# Patient Record
Sex: Female | Born: 1935 | Race: Black or African American | Hispanic: No | Smoking: Never smoker
Health system: Southern US, Community
[De-identification: ages and names within clinical notes are randomized; demographics above are authoritative.]

## PROBLEM LIST (undated history)

## (undated) DIAGNOSIS — I441 Atrioventricular block, second degree: Secondary | ICD-10-CM

## (undated) DIAGNOSIS — N189 Chronic kidney disease, unspecified: Secondary | ICD-10-CM

## (undated) DIAGNOSIS — E785 Hyperlipidemia, unspecified: Secondary | ICD-10-CM

## (undated) DIAGNOSIS — E119 Type 2 diabetes mellitus without complications: Secondary | ICD-10-CM

## (undated) DIAGNOSIS — Z950 Presence of cardiac pacemaker: Secondary | ICD-10-CM

## (undated) DIAGNOSIS — H409 Unspecified glaucoma: Secondary | ICD-10-CM

## (undated) DIAGNOSIS — I10 Essential (primary) hypertension: Secondary | ICD-10-CM

## (undated) DIAGNOSIS — M1990 Unspecified osteoarthritis, unspecified site: Secondary | ICD-10-CM

## (undated) HISTORY — PX: ABDOMINAL HYSTERECTOMY: SHX81

## (undated) HISTORY — PX: CHOLECYSTECTOMY: SHX55

## (undated) HISTORY — PX: TONSILLECTOMY: SUR1361

## (undated) HISTORY — DX: Atrioventricular block, second degree: I44.1

## (undated) HISTORY — PX: OOPHORECTOMY: SHX86

## (undated) HISTORY — DX: Essential (primary) hypertension: I10

## (undated) HISTORY — DX: Presence of cardiac pacemaker: Z95.0

## (undated) HISTORY — PX: APPENDECTOMY: SHX54

## (undated) HISTORY — DX: Hyperlipidemia, unspecified: E78.5

## (undated) HISTORY — DX: Type 2 diabetes mellitus without complications: E11.9

---

## 2003-11-02 ENCOUNTER — Other Ambulatory Visit: Payer: Self-pay

## 2004-01-01 ENCOUNTER — Other Ambulatory Visit: Payer: Self-pay

## 2004-09-14 ENCOUNTER — Ambulatory Visit: Payer: Self-pay

## 2005-10-28 ENCOUNTER — Ambulatory Visit: Payer: Self-pay

## 2006-11-03 ENCOUNTER — Ambulatory Visit: Payer: Self-pay | Admitting: Internal Medicine

## 2007-09-05 ENCOUNTER — Inpatient Hospital Stay: Payer: Self-pay | Admitting: Internal Medicine

## 2007-09-05 ENCOUNTER — Other Ambulatory Visit: Payer: Self-pay

## 2007-10-26 ENCOUNTER — Ambulatory Visit: Payer: Self-pay | Admitting: Internal Medicine

## 2008-04-23 ENCOUNTER — Ambulatory Visit: Payer: Self-pay | Admitting: Internal Medicine

## 2008-05-17 ENCOUNTER — Ambulatory Visit: Payer: Self-pay | Admitting: Internal Medicine

## 2008-06-17 ENCOUNTER — Ambulatory Visit: Payer: Self-pay | Admitting: Internal Medicine

## 2008-07-15 ENCOUNTER — Ambulatory Visit: Payer: Self-pay | Admitting: Internal Medicine

## 2008-10-30 ENCOUNTER — Ambulatory Visit: Payer: Self-pay | Admitting: Internal Medicine

## 2009-11-10 ENCOUNTER — Ambulatory Visit: Payer: Self-pay | Admitting: Internal Medicine

## 2010-12-29 ENCOUNTER — Ambulatory Visit: Payer: Self-pay | Admitting: Internal Medicine

## 2010-12-30 ENCOUNTER — Ambulatory Visit: Payer: Self-pay | Admitting: Internal Medicine

## 2011-05-28 ENCOUNTER — Ambulatory Visit: Payer: Self-pay | Admitting: Internal Medicine

## 2011-06-18 ENCOUNTER — Ambulatory Visit: Payer: Self-pay | Admitting: Internal Medicine

## 2011-07-16 ENCOUNTER — Ambulatory Visit: Payer: Self-pay | Admitting: Internal Medicine

## 2011-12-31 ENCOUNTER — Ambulatory Visit: Payer: Self-pay | Admitting: Internal Medicine

## 2012-08-25 ENCOUNTER — Encounter: Payer: Self-pay | Admitting: *Deleted

## 2013-01-02 ENCOUNTER — Ambulatory Visit: Payer: Self-pay | Admitting: Internal Medicine

## 2013-10-31 DIAGNOSIS — N184 Chronic kidney disease, stage 4 (severe): Secondary | ICD-10-CM | POA: Insufficient documentation

## 2013-10-31 DIAGNOSIS — N183 Chronic kidney disease, stage 3 unspecified: Secondary | ICD-10-CM | POA: Insufficient documentation

## 2013-10-31 DIAGNOSIS — E1169 Type 2 diabetes mellitus with other specified complication: Secondary | ICD-10-CM | POA: Insufficient documentation

## 2013-10-31 DIAGNOSIS — E782 Mixed hyperlipidemia: Secondary | ICD-10-CM | POA: Insufficient documentation

## 2013-10-31 DIAGNOSIS — E785 Hyperlipidemia, unspecified: Secondary | ICD-10-CM | POA: Insufficient documentation

## 2013-10-31 DIAGNOSIS — E1122 Type 2 diabetes mellitus with diabetic chronic kidney disease: Secondary | ICD-10-CM | POA: Insufficient documentation

## 2014-01-03 ENCOUNTER — Ambulatory Visit: Payer: Self-pay | Admitting: Internal Medicine

## 2014-01-06 ENCOUNTER — Emergency Department: Payer: Self-pay | Admitting: Internal Medicine

## 2014-01-11 ENCOUNTER — Ambulatory Visit: Payer: Self-pay | Admitting: Unknown Physician Specialty

## 2014-01-30 DIAGNOSIS — M1712 Unilateral primary osteoarthritis, left knee: Secondary | ICD-10-CM | POA: Insufficient documentation

## 2014-05-03 DIAGNOSIS — R251 Tremor, unspecified: Secondary | ICD-10-CM | POA: Insufficient documentation

## 2014-05-03 DIAGNOSIS — E559 Vitamin D deficiency, unspecified: Secondary | ICD-10-CM | POA: Insufficient documentation

## 2014-06-15 ENCOUNTER — Emergency Department: Payer: Self-pay | Admitting: Physician Assistant

## 2014-06-25 ENCOUNTER — Encounter: Payer: Self-pay | Admitting: Podiatry

## 2014-06-25 ENCOUNTER — Ambulatory Visit (INDEPENDENT_AMBULATORY_CARE_PROVIDER_SITE_OTHER): Payer: Medicare Other | Admitting: Podiatry

## 2014-06-25 DIAGNOSIS — M79676 Pain in unspecified toe(s): Secondary | ICD-10-CM

## 2014-06-25 DIAGNOSIS — B351 Tinea unguium: Secondary | ICD-10-CM

## 2014-06-25 NOTE — Progress Notes (Signed)
   Subjective:    Patient ID: Haley MaplesAlice R Baquera, female    DOB: 07/17/1929, 81 y.o.   MRN: [id number]  HPI 81 year old female presents the office today with complaints of painful, elongated, thick toenails. She denies any recent redness or drainage from the nail sites however she does state the nails become painful with pressure. She denies any history of ulceration or any intermittent claudication symptoms. She states that she's been diabetic since 81 years old. No other complaints at this time.   Review of Systems  Allergic/Immunologic: Positive for food allergies.  Neurological: Positive for tremors.  All other systems reviewed and are negative.      Objective:   Physical Exam AAO 3, NAD DP pulses 2/4 bilaterally, PT pulse 1/4 bilaterally, CRT less than 3 seconds Protective sensation intact with Simms Weinstein monofilament, vibratory sensation intact, Achilles tendon reflex intact. Nails are hypertrophic, dystrophic, elongated, brittle, discolored 10. There is objective tenderness along the toenails 10. No surrounding erythema or drainage. No open lesions or pre-ulcerative lesions are identified. No other areas of tenderness bilateral lower extremity is no overlying edema, erythema, increased warmth. MMT 5/5, ROM WNL No pain with calf compression, swelling, warmth, erythema.       Assessment & Plan:  81 year old female with symptomatic onychomycosis -Treatment options discussed including alternatives, risks, complications. -Nail sharply debrided 10 without complication/bleeding. -Discussed importance of daily foot inspection. -Follow-up in 3 months or sooner if any problems are to arise. Encouraged to call the office with any questions, concerns, change in symptoms.

## 2014-06-25 NOTE — Patient Instructions (Signed)
Diabetes and Foot Care Diabetes may cause you to have problems because of poor blood supply (circulation) to your feet and legs. This may cause the skin on your feet to become thinner, break easier, and heal more slowly. Your skin may become dry, and the skin may peel and crack. You may also have nerve damage in your legs and feet causing decreased feeling in them. You may not notice minor injuries to your feet that could lead to infections or more serious problems. Taking care of your feet is one of the most important things you can do for yourself.  HOME CARE INSTRUCTIONS  Wear shoes at all times, even in the house. Do not go barefoot. Bare feet are easily injured.  Check your feet daily for blisters, cuts, and redness. If you cannot see the bottom of your feet, use a mirror or ask someone for help.  Wash your feet with warm water (do not use hot water) and mild soap. Then pat your feet and the areas between your toes until they are completely dry. Do not soak your feet as this can dry your skin.  Apply a moisturizing lotion or petroleum jelly (that does not contain alcohol and is unscented) to the skin on your feet and to dry, brittle toenails. Do not apply lotion between your toes.  Trim your toenails straight across. Do not dig under them or around the cuticle. File the edges of your nails with an emery board or nail file.  Do not cut corns or calluses or try to remove them with medicine.  Wear clean socks or stockings every day. Make sure they are not too tight. Do not wear knee-high stockings since they may decrease blood flow to your legs.  Wear shoes that fit properly and have enough cushioning. To break in new shoes, wear them for just a few hours a day. This prevents you from injuring your feet. Always look in your shoes before you put them on to be sure there are no objects inside.  Do not cross your legs. This may decrease the blood flow to your feet.  If you find a minor scrape,  cut, or break in the skin on your feet, keep it and the skin around it clean and dry. These areas may be cleansed with mild soap and water. Do not cleanse the area with peroxide, alcohol, or iodine.  When you remove an adhesive bandage, be sure not to damage the skin around it.  If you have a wound, look at it several times a day to make sure it is healing.  Do not use heating pads or hot water bottles. They may burn your skin. If you have lost feeling in your feet or legs, you may not know it is happening until it is too late.  Make sure your health care provider performs a complete foot exam at least annually or more often if you have foot problems. Report any cuts, sores, or bruises to your health care provider immediately. SEEK MEDICAL CARE IF:   You have an injury that is not healing.  You have cuts or breaks in the skin.  You have an ingrown nail.  You notice redness on your legs or feet.  You feel burning or tingling in your legs or feet.  You have pain or cramps in your legs and feet.  Your legs or feet are numb.  Your feet always feel cold. SEEK IMMEDIATE MEDICAL CARE IF:   There is increasing redness,   swelling, or pain in or around a wound.  There is a red line that goes up your leg.  Pus is coming from a wound.  You develop a fever or as directed by your health care provider.  You notice a bad smell coming from an ulcer or wound. Document Released: 04/30/2000 Document Revised: 01/03/2013 Document Reviewed: 10/10/2012 Exit[employer] Patient Information 2015 [employer]. This information is not intended to replace advice given to you by your health care provider. Make sure you discuss any questions you have with your health care provider.  

## 2014-07-05 ENCOUNTER — Ambulatory Visit: Payer: Self-pay | Admitting: Orthopedic Surgery

## 2014-07-05 MED ORDER — DEXAMETHASONE SODIUM PHOSPHATE 4 MG/ML IJ SOLN: 4.0000 mg | Freq: Once | INTRAMUSCULAR | Status: DC

## 2014-07-05 MED ORDER — ACETAMINOPHEN 10 MG/ML IV SOLN: 1000.0000 mg | Freq: Once | INTRAVENOUS | Status: AC

## 2014-07-05 NOTE — H&P (Signed)
TOTAL KNEE ADMISSION H&P  Patient is being admitted for left medial UKA vs total knee arthroplasty.  Subjective:  Chief Complaint:left knee pain.  HPI: Haley Mayer, 81 y.o. female, has a history of pain and functional disability in the left knee due to AVN with subchondral fracture and has failed non-surgical conservative treatments for greater than 12 weeks to includeNSAID's and/or analgesics, use of assistive devices and activity modification.  Onset of symptoms was abrupt, starting 1 years ago with gradually worsening course since that time. The patient noted no past surgery on the left knee(s).  Patient currently rates pain in the left knee(s) at 10 out of 10 with activity. Patient has night pain, worsening of pain with activity and weight bearing and pain that interferes with activities of daily living.  Patient has evidence of joint space narrowing and displaced subchondral stress fracture by imaging studies.There is no active infection.  There are no active problems to display for this patient.  Past Medical History  Diagnosis Date  . Hypertension   . Diabetes mellitus without complication   . Hyperlipidemia     No past surgical history on file.   (Not in a hospital admission) Allergies  Allergen Reactions  . Loperamide Hives  . Primidone   . Vasotec [Enalapril]     History  Substance Use Topics  . Smoking status: Never Smoker   . Smokeless tobacco: Never Used  . Alcohol Use: No    No family history on file.   Review of Systems  Constitutional: Negative.   HENT: Negative.   Eyes: Negative.   Respiratory: Negative.   Cardiovascular: Negative.   Gastrointestinal: Negative.   Genitourinary: Negative.   Musculoskeletal: Positive for joint pain.       Left knee pain  Skin: Negative.   Neurological: Negative.   Endo/Heme/Allergies: Positive for environmental allergies.  Psychiatric/Behavioral: Negative.     Objective:  Physical Exam  Vitals  reviewed. Constitutional: She is oriented to person, place, and time. She appears well-developed and well-nourished.  HENT:  Head: Normocephalic and atraumatic.  Eyes: EOM are normal. Pupils are equal, round, and reactive to light.  Neck: Normal range of motion. Neck supple.  Cardiovascular: Normal rate, regular rhythm, normal heart sounds and intact distal pulses.   Respiratory: Effort normal and breath sounds normal.  GI: Soft. Bowel sounds are normal.  Genitourinary:  deferred  Musculoskeletal:       Left knee: She exhibits normal range of motion. Tenderness found. Medial joint line tenderness noted.  Neurological: She is alert and oriented to person, place, and time.  Skin: Skin is warm and dry.  Psychiatric: She has a normal mood and affect. Her behavior is normal.    Vital signs in last 24 hours: @VSRANGES @  Labs:   There is no height or weight on file to calculate BMI.   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 The overall alignment ismild varus. The bone quality appears to be adequate for age and reported activity level.  Assessment/Plan:  AVN with displaced subchondral stress fracture, left knee   The patient history, physical examination, clinical judgment of the provider and imaging studies are consistent with AVN with displaced subchondral stress fracture of the left knee(s) and medial UKA vs total knee arthroplasty is deemed medically necessary. The treatment options including medical management, injection therapy arthroscopy and arthroplasty were discussed at length. The risks and benefits of total knee arthroplasty were presented and reviewed. The risks  due to aseptic loosening, infection, stiffness, patella tracking problems, thromboembolic complications and other imponderables were discussed. The patient acknowledged the explanation, agreed to proceed with the plan and consent was signed. Patient is being  admitted for inpatient treatment for surgery, pain control, PT, OT, prophylactic antibiotics, VTE prophylaxis, progressive ambulation and ADL's and discharge planning. The patient is planning to be discharged home with home health services

## 2014-07-08 NOTE — Patient Instructions (Addendum)
Haley Mayer  07/08/2014   Your procedure is scheduled on: 07/19/2014    [facility]  [facility] and follow signs to               Short Stay Center at      0530 AM.  Call this number if you have problems the morning of surgery [phone]   Remember:  Do not eat food or drink liquids :After Midnight.     Take these medicines the morning of surgery with A SIP OF WATER: Amlodipine ( Norvasc)                                You may not have any metal on your body including hair pins and              piercings  Do not wear jewelry, make-up, lotions, powders or perfumes., deodorant.              Do not wear nail polish.  Do not shave  48 hours prior to surgery.                Do not bring valuables to the hospital. [place] IS NOT             RESPONSIBLE   FOR VALUABLES.  Contacts, dentures or bridgework may not be worn into surgery.  Leave suitcase in the car. After surgery it may be brought to your room.         Special Instructions: coughing and deep breathing exercises, leg exercises               Please read over the following fact sheets you were given: _____________________________________________________________________             [facility] - Preparing for Surgery Before surgery, you can play an important role.  Because skin is not sterile, your skin needs to be as free of germs as possible.  You can reduce the number of germs on your skin by washing with CHG (chlorahexidine gluconate) soap before surgery.  CHG is an antiseptic cleaner which kills germs and bonds with the skin to continue killing germs even after washing. Please DO NOT use if you have an allergy to CHG or antibacterial soaps.  If your skin becomes reddened/irritated stop using the CHG and inform your nurse when you arrive at Short Stay. Do not shave (including legs and underarms) for at least 48 hours prior to the first CHG shower.  You may shave your  face/neck. Please follow these instructions carefully:  1.  Shower with CHG Soap the night before surgery and the  morning of Surgery.  2.  If you choose to wash your hair, wash your hair first as usual with your  normal  shampoo.  3.  After you shampoo, rinse your hair and body thoroughly to remove the  shampoo.                           4.  Use CHG as you would any other liquid soap.  You can apply chg directly  to the skin and wash                       Gently with a scrungie or clean washcloth.  5.  Apply the CHG Soap to your body ONLY FROM THE NECK DOWN.   Do not use on face/ open                           Wound or open sores. Avoid contact with eyes, ears mouth and genitals (private parts).                       Wash face,  Genitals (private parts) with your normal soap.             6.  Wash thoroughly, paying special attention to the area where your surgery  will be performed.  7.  Thoroughly rinse your body with warm water from the neck down.  8.  DO NOT shower/wash with your normal soap after using and rinsing off  the CHG Soap.                9.  Pat yourself dry with a clean towel.            10.  Wear clean pajamas.            11.  Place clean sheets on your bed the night of your first shower and do not  sleep with pets. Day of Surgery : Do not apply any lotions/deodorants the morning of surgery.  Please wear clean clothes to the hospital/surgery center.  FAILURE TO FOLLOW THESE INSTRUCTIONS MAY RESULT IN THE CANCELLATION OF YOUR SURGERY PATIENT SIGNATURE_________________________________  NURSE SIGNATURE__________________________________  ________________________________________________________________________  WHAT IS A BLOOD TRANSFUSION? Blood Transfusion Information  A transfusion is the replacement of blood or some of its parts. Blood is made up of multiple cells which provide different functions.  Red blood cells carry oxygen and are used for blood loss  replacement.  White blood cells fight against infection.  Platelets control bleeding.  Plasma helps clot blood.  Other blood products are available for specialized needs, such as hemophilia or other clotting disorders. BEFORE THE TRANSFUSION  Who gives blood for transfusions?   Healthy volunteers who are fully evaluated to make sure their blood is safe. This is blood bank blood. Transfusion therapy is the safest it has ever been in the practice of medicine. Before blood is taken from a donor, a complete history is taken to make sure that person has no history of diseases nor engages in risky social behavior (examples are intravenous drug use or sexual activity with multiple partners). The donor's travel history is screened to minimize risk of transmitting infections, such as malaria. The donated blood is tested for signs of infectious diseases, such as HIV and hepatitis. The blood is then tested to be sure it is compatible with you in order to minimize the chance of a transfusion reaction. If you or a relative donates blood, this is often done in anticipation of surgery and is not appropriate for emergency situations. It takes many days to process the donated blood. RISKS AND COMPLICATIONS Although transfusion therapy is very safe and saves many lives, the main dangers of transfusion include:  1. Getting an infectious disease. 2. Developing a transfusion reaction. This is an allergic reaction to something in the blood you were given. Every precaution is taken to prevent this. The decision to have a blood transfusion has been considered carefully by your caregiver before blood is given. Blood is not given unless the benefits outweigh the risks. AFTER THE TRANSFUSION  Right after  receiving a blood transfusion, you will usually feel much better and more energetic. This is especially true if your red blood cells have gotten low (anemic). The transfusion raises the level of the red blood cells which  carry oxygen, and this usually causes an energy increase.  The nurse administering the transfusion will monitor you carefully for complications. HOME CARE INSTRUCTIONS  No special instructions are needed after a transfusion. You may find your energy is better. Speak with your caregiver about any limitations on activity for underlying diseases you may have. SEEK MEDICAL CARE IF:   Your condition is not improving after your transfusion.  You develop redness or irritation at the intravenous (IV) site. SEEK IMMEDIATE MEDICAL CARE IF:  Any of the following symptoms occur over the next 12 hours:  Shaking chills.  You have a temperature by mouth above 102 F (38.9 C), not controlled by medicine.  Chest, back, or muscle pain.  People around you feel you are not acting correctly or are confused.  Shortness of breath or difficulty breathing.  Dizziness and fainting.  You get a rash or develop hives.  You have a decrease in urine output.  Your urine turns a dark color or changes to pink, red, or brown. Any of the following symptoms occur over the next 10 days:  You have a temperature by mouth above 102 F (38.9 C), not controlled by medicine.  Shortness of breath.  Weakness after normal activity.  The white part of the eye turns yellow (jaundice).  You have a decrease in the amount of urine or are urinating less often.  Your urine turns a dark color or changes to pink, red, or brown. Document Released: 04/30/2000 Document Revised: 07/26/2011 Document Reviewed: 12/18/2007 Exit[employer] Patient Information 2014 [place].  _______________________________________________________________________  Incentive Spirometer  An incentive spirometer is a tool that can help keep your lungs clear and active. This tool measures how well you are filling your lungs with each breath. Taking long deep breaths may help reverse or decrease the chance of developing breathing (pulmonary) problems  (especially infection) following:  A long period of time when you are unable to move or be active. BEFORE THE PROCEDURE   If the spirometer includes an indicator to show your best effort, your nurse or respiratory therapist will set it to a desired goal.  If possible, sit up straight or lean slightly forward. Try not to slouch.  Hold the incentive spirometer in an upright position. INSTRUCTIONS FOR USE  3. Sit on the edge of your bed if possible, or sit up as far as you can in bed or on a chair. 4. Hold the incentive spirometer in an upright position. 5. Breathe out normally. 6. Place the mouthpiece in your mouth and seal your lips tightly around it. 7. Breathe in slowly and as deeply as possible, raising the piston or the ball toward the top of the column. 8. Hold your breath for 3-5 seconds or for as long as possible. Allow the piston or ball to fall to the bottom of the column. 9. Remove the mouthpiece from your mouth and breathe out normally. 10. Rest for a few seconds and repeat Steps 1 through 7 at least 10 times every 1-2 hours when you are awake. Take your time and take a few normal breaths between deep breaths. 11. The spirometer may include an indicator to show your best effort. Use the indicator as a goal to work toward during each repetition. 12. After each set  of 10 deep breaths, practice coughing to be sure your lungs are clear. If you have an incision (the cut made at the time of surgery), support your incision when coughing by placing a pillow or rolled up towels firmly against it. Once you are able to get out of bed, walk around indoors and cough well. You may stop using the incentive spirometer when instructed by your caregiver.  RISKS AND COMPLICATIONS  Take your time so you do not get dizzy or light-headed.  If you are in pain, you may need to take or ask for pain medication before doing incentive spirometry. It is harder to take a deep breath if you are having  pain. AFTER USE  Rest and breathe slowly and easily.  It can be helpful to keep track of a log of your progress. Your caregiver can provide you with a simple table to help with this. If you are using the spirometer at home, follow these instructions: [place] IF:   You are having difficultly using the spirometer.  You have trouble using the spirometer as often as instructed.  Your pain medication is not giving enough relief while using the spirometer.  You develop fever of 100.5 F (38.1 C) or higher. SEEK IMMEDIATE MEDICAL CARE IF:   You cough up bloody sputum that had not been present before.  You develop fever of 102 F (38.9 C) or greater.  You develop worsening pain at or near the incision site. MAKE SURE YOU:   Understand these instructions.  Will watch your condition.  Will get help right away if you are not doing well or get worse. Document Released: 09/13/2006 Document Revised: 07/26/2011 Document Reviewed: 11/14/2006 [facility] Patient Information 2014 [place], [place].   ________________________________________________________________________

## 2014-07-09 ENCOUNTER — Encounter (HOSPITAL_COMMUNITY): Payer: Self-pay

## 2014-07-09 ENCOUNTER — Encounter (HOSPITAL_COMMUNITY)
Admission: RE | Admit: 2014-07-09 | Discharge: 2014-07-09 | Disposition: A | Discharge status: Home / Self Care | Payer: Medicare Other | Source: Ambulatory Visit | Attending: Orthopedic Surgery | Admitting: Orthopedic Surgery

## 2014-07-09 DIAGNOSIS — Z539 Procedure and treatment not carried out, unspecified reason: Secondary | ICD-10-CM | POA: Insufficient documentation

## 2014-07-09 DIAGNOSIS — Z01810 Encounter for preprocedural cardiovascular examination: Secondary | ICD-10-CM | POA: Diagnosis not present

## 2014-07-09 DIAGNOSIS — Z01812 Encounter for preprocedural laboratory examination: Secondary | ICD-10-CM | POA: Insufficient documentation

## 2014-07-09 HISTORY — DX: Chronic kidney disease, unspecified: N18.9

## 2014-07-09 HISTORY — DX: Unspecified osteoarthritis, unspecified site: M19.90

## 2014-07-09 LAB — URINALYSIS, ROUTINE W REFLEX MICROSCOPIC
Bilirubin Urine: NEGATIVE
Glucose, UA: NEGATIVE mg/dL
HGB URINE DIPSTICK: NEGATIVE
Ketones, ur: NEGATIVE mg/dL
Leukocytes, UA: NEGATIVE
Nitrite: NEGATIVE
Protein, ur: NEGATIVE mg/dL
SPECIFIC GRAVITY, URINE: 1.024 (ref 1.005–1.030)
UROBILINOGEN UA: 0.2 mg/dL (ref 0.0–1.0)
pH: 5.5 (ref 5.0–8.0)

## 2014-07-09 LAB — SURGICAL PCR SCREEN
MRSA, PCR: NEGATIVE
Staphylococcus aureus: NEGATIVE

## 2014-07-09 LAB — COMPREHENSIVE METABOLIC PANEL
ALT: 14 U/L (ref 0–35)
ANION GAP: 9 (ref 5–15)
AST: 19 U/L (ref 0–37)
Albumin: 4 g/dL (ref 3.5–5.2)
Alkaline Phosphatase: 69 U/L (ref 39–117)
BUN: 31 mg/dL — AB (ref 6–23)
CO2: 30 mmol/L (ref 19–32)
Calcium: 9.4 mg/dL (ref 8.4–10.5)
Chloride: 102 mmol/L (ref 96–112)
Creatinine, Ser: 1.2 mg/dL — ABNORMAL HIGH (ref 0.50–1.10)
GFR calc non Af Amer: 41 mL/min — ABNORMAL LOW (ref >90)
GFR, EST AFRICAN AMERICAN: 47 mL/min — AB (ref >90)
GLUCOSE: 177 mg/dL — AB (ref 70–99)
Potassium: 4.1 mmol/L (ref 3.5–5.1)
SODIUM: 141 mmol/L (ref 135–145)
TOTAL PROTEIN: 7.8 g/dL (ref 6.0–8.3)
Total Bilirubin: 0.6 mg/dL (ref 0.3–1.2)

## 2014-07-09 LAB — CBC
HCT: 40.5 % (ref 36.0–46.0)
HEMOGLOBIN: 13.4 g/dL (ref 12.0–15.0)
MCH: 31.5 pg (ref 26.0–34.0)
MCHC: 33.1 g/dL (ref 30.0–36.0)
MCV: 95.1 fL (ref 78.0–100.0)
PLATELETS: 162 10*3/uL (ref 150–400)
RBC: 4.26 MIL/uL (ref 3.87–5.11)
RDW: 12.9 % (ref 11.5–15.5)
WBC: 6.8 10*3/uL (ref 4.0–10.5)

## 2014-07-09 LAB — PROTIME-INR
INR: 0.97 (ref 0.00–1.49)
Prothrombin Time: 13 seconds (ref 11.6–15.2)

## 2014-07-09 LAB — APTT: aPTT: 26 seconds (ref 24–37)

## 2014-07-09 NOTE — Progress Notes (Signed)
Patient to see cardiologist on 07/15/2014 ( [place]. ).  Patient states " I have a blockage per Dr Harrington Challengerhies. " Also patient reports at time of preop appointment that she has seen dentist and has a tooth that needs a filling.  Instructed patient to call and notify Dr Linna CapriceSwinteck prior to surgery.  Patient and daughter voiced understanding.

## 2014-07-19 ENCOUNTER — Encounter (HOSPITAL_COMMUNITY): Admission: RE | Payer: Self-pay | Source: Ambulatory Visit

## 2014-07-19 ENCOUNTER — Inpatient Hospital Stay (HOSPITAL_COMMUNITY)
Admission: RE | Admit: 2014-07-19 | Payer: BLUE CROSS/BLUE SHIELD | Source: Ambulatory Visit | Admitting: Orthopedic Surgery

## 2014-07-19 SURGERY — ARTHROPLASTY, KNEE, TOTAL
Anesthesia: Choice | Laterality: Left

## 2014-07-25 DIAGNOSIS — I447 Left bundle-branch block, unspecified: Secondary | ICD-10-CM | POA: Insufficient documentation

## 2014-07-26 ENCOUNTER — Ambulatory Visit: Payer: Self-pay | Admitting: Orthopedic Surgery

## 2014-07-26 MED ORDER — DEXAMETHASONE SODIUM PHOSPHATE 4 MG/ML IJ SOLN
4.0000 mg | Freq: Once | INTRAMUSCULAR | Status: AC
  Administered 2014-08-07: 4 mg via INTRAVENOUS

## 2014-07-26 MED ORDER — ACETAMINOPHEN 10 MG/ML IV SOLN: 1000.0000 mg | Freq: Once | INTRAVENOUS | Status: AC

## 2014-07-26 MED ORDER — SODIUM CHLORIDE 0.9 % IV SOLN: 4.0000 mg | Freq: Once | INTRAVENOUS | Status: DC

## 2014-08-05 ENCOUNTER — Encounter (HOSPITAL_COMMUNITY): Payer: Self-pay | Admitting: Pharmacy Technician

## 2014-08-06 ENCOUNTER — Encounter (HOSPITAL_COMMUNITY): Payer: Self-pay | Admitting: *Deleted

## 2014-08-06 MED ORDER — CHLORHEXIDINE GLUCONATE 4 % EX LIQD
60.0000 mL | Freq: Once | CUTANEOUS | Status: DC
  Filled 2014-08-06: qty 60

## 2014-08-06 MED ORDER — CHLORHEXIDINE GLUCONATE 4 % EX LIQD: 60.0000 mL | Freq: Once | CUTANEOUS | Status: DC

## 2014-08-06 MED ORDER — SODIUM CHLORIDE 0.9 % IV SOLN: INTRAVENOUS | Status: DC

## 2014-08-06 MED ORDER — TRANEXAMIC ACID 100 MG/ML IV SOLN
1000.0000 mg | INTRAVENOUS | Status: DC
  Filled 2014-08-06: qty 10

## 2014-08-06 MED ORDER — CEFAZOLIN SODIUM-DEXTROSE 2-3 GM-% IV SOLR
2.0000 g | INTRAVENOUS | Status: AC
  Administered 2014-08-07: 2 g via INTRAVENOUS
  Filled 2014-08-06: qty 50

## 2014-08-06 MED ORDER — TRANEXAMIC ACID 100 MG/ML IV SOLN
1000.0000 mg | INTRAVENOUS | Status: AC
  Administered 2014-08-07: 1000 mg via INTRAVENOUS
  Filled 2014-08-06: qty 10

## 2014-08-06 MED ORDER — CEFAZOLIN SODIUM-DEXTROSE 2-3 GM-% IV SOLR: 2.0000 g | INTRAVENOUS | Status: DC

## 2014-08-06 NOTE — Progress Notes (Signed)
Anesthesia Chart Review:  SAME DAY WORK-UP.  Patient is a 81 year old female scheduled for left mediali partial replacement verus TKA on 08/07/14 by Dr. Veda CanningSwintek.  Anesthesia is posted for [facility].  History includes non-smoker, HTN, HLD, DM2, CKD, arthritis. PCP is Dr. Hal Moralesavid Theis. He recommended cardiac clearance prior to surgery patient was seen by Dr[facility] Arnoldo HookerBruce [facility]alski [facility] and cleared on 07/25/14 following testing (see below).   Meds include amlodipine, glimepiride, Mag-Ox, Zocor, Timoptic, Diovan-HCT.  07/09/14 EKG: NSR, left BBB.  CARE EVERYWHERE: - Nuclear stress test 07/25/14:  LVEF= 79% FINDINGS: Regional wall motion: reveals normal myocardial thickening and wall motion. The overall quality of the study is good.  Artifacts noted: no Left ventricular cavity: normal. Perfusion Analysis: SPECT images demonstrate homogeneous tracer distribution throughout the myocardium.    - Echo 07/25/14: INTERPRETATION NORMAL LEFT VENTRICULAR SYSTOLIC FUNCTION WITH AN ESTIMATED EF = 50-55 % NORMAL RIGHT VENTRICULAR SYSTOLIC FUNCTION MILD-TO-MODERATE TRICUSPID AND MITRAL VALVE INSUFFICIENCY TRACE AORTIC VALVE INSUFFICIENCY NO VALVULAR STENOSIS MODERATE LVH MILD LA ENLARGEMENT  Labs from 07/09/14 noted.  She will need repeat labs on arrival. Cr then was 1.20.  Velna Ochsllison Elka Satterfield, PA-C [facility] Short Stay [facility]/Anesthesiology Phone [phone] 08/06/2014 3:28 PM

## 2014-08-06 NOTE — Progress Notes (Signed)
Haley Mayer reported that fasting CBG's run 80-150, she thinks last A1C was 7 something.

## 2014-08-07 ENCOUNTER — Inpatient Hospital Stay (HOSPITAL_COMMUNITY)
Admission: RE | Admit: 2014-08-07 | Discharge: 2014-08-09 | DRG: 470 | Disposition: A | Discharge status: Home Health | Payer: Medicare Other | Source: Ambulatory Visit | Attending: Orthopedic Surgery | Admitting: Orthopedic Surgery

## 2014-08-07 ENCOUNTER — Inpatient Hospital Stay (HOSPITAL_COMMUNITY): Payer: Medicare Other

## 2014-08-07 ENCOUNTER — Inpatient Hospital Stay (HOSPITAL_COMMUNITY): Payer: Medicare Other | Admitting: Vascular Surgery

## 2014-08-07 ENCOUNTER — Encounter (HOSPITAL_COMMUNITY)
Admission: RE | Disposition: A | Discharge status: Home / Self Care | Payer: Medicare Other | Source: Ambulatory Visit | Attending: Orthopedic Surgery

## 2014-08-07 ENCOUNTER — Encounter (HOSPITAL_COMMUNITY): Payer: Self-pay | Admitting: *Deleted

## 2014-08-07 DIAGNOSIS — M25562 Pain in left knee: Secondary | ICD-10-CM | POA: Diagnosis present

## 2014-08-07 DIAGNOSIS — N189 Chronic kidney disease, unspecified: Secondary | ICD-10-CM | POA: Diagnosis present

## 2014-08-07 DIAGNOSIS — Z79899 Other long term (current) drug therapy: Secondary | ICD-10-CM

## 2014-08-07 DIAGNOSIS — I447 Left bundle-branch block, unspecified: Secondary | ICD-10-CM | POA: Diagnosis present

## 2014-08-07 DIAGNOSIS — M87852 Other osteonecrosis, left femur: Secondary | ICD-10-CM | POA: Diagnosis present

## 2014-08-07 DIAGNOSIS — I441 Atrioventricular block, second degree: Secondary | ICD-10-CM | POA: Diagnosis not present

## 2014-08-07 DIAGNOSIS — Z886 Allergy status to analgesic agent status: Secondary | ICD-10-CM

## 2014-08-07 DIAGNOSIS — E785 Hyperlipidemia, unspecified: Secondary | ICD-10-CM | POA: Diagnosis present

## 2014-08-07 DIAGNOSIS — E119 Type 2 diabetes mellitus without complications: Secondary | ICD-10-CM | POA: Diagnosis present

## 2014-08-07 DIAGNOSIS — Z419 Encounter for procedure for purposes other than remedying health state, unspecified: Secondary | ICD-10-CM

## 2014-08-07 DIAGNOSIS — H409 Unspecified glaucoma: Secondary | ICD-10-CM | POA: Diagnosis present

## 2014-08-07 DIAGNOSIS — M87059 Idiopathic aseptic necrosis of unspecified femur: Secondary | ICD-10-CM | POA: Diagnosis present

## 2014-08-07 DIAGNOSIS — R001 Bradycardia, unspecified: Secondary | ICD-10-CM | POA: Diagnosis not present

## 2014-08-07 DIAGNOSIS — Z9109 Other allergy status, other than to drugs and biological substances: Secondary | ICD-10-CM | POA: Diagnosis not present

## 2014-08-07 DIAGNOSIS — I129 Hypertensive chronic kidney disease with stage 1 through stage 4 chronic kidney disease, or unspecified chronic kidney disease: Secondary | ICD-10-CM | POA: Diagnosis present

## 2014-08-07 HISTORY — PX: PARTIAL KNEE ARTHROPLASTY: SHX2174

## 2014-08-07 HISTORY — DX: Unspecified glaucoma: H40.9

## 2014-08-07 LAB — COMPREHENSIVE METABOLIC PANEL
ALT: 14 U/L (ref 0–35)
ANION GAP: 8 (ref 5–15)
AST: 26 U/L (ref 0–37)
Albumin: 3.6 g/dL (ref 3.5–5.2)
Alkaline Phosphatase: 71 U/L (ref 39–117)
BILIRUBIN TOTAL: 1 mg/dL (ref 0.3–1.2)
BUN: 18 mg/dL (ref 6–23)
CALCIUM: 9.5 mg/dL (ref 8.4–10.5)
CHLORIDE: 102 mmol/L (ref 96–112)
CO2: 29 mmol/L (ref 19–32)
CREATININE: 1.16 mg/dL — AB (ref 0.50–1.10)
GFR, EST AFRICAN AMERICAN: 49 mL/min — AB (ref >90)
GFR, EST NON AFRICAN AMERICAN: 43 mL/min — AB (ref >90)
GLUCOSE: 155 mg/dL — AB (ref 70–99)
Potassium: 4.1 mmol/L (ref 3.5–5.1)
Sodium: 139 mmol/L (ref 135–145)
Total Protein: 7.1 g/dL (ref 6.0–8.3)

## 2014-08-07 LAB — GLUCOSE, CAPILLARY
Glucose-Capillary: 158 mg/dL — ABNORMAL HIGH (ref 70–99)
Glucose-Capillary: 151 mg/dL — ABNORMAL HIGH (ref 70–99)

## 2014-08-07 LAB — TYPE AND SCREEN
ABO/RH(D): O POS
Antibody Screen: NEGATIVE

## 2014-08-07 LAB — CBC
HCT: 39 % (ref 36.0–46.0)
Hemoglobin: 13 g/dL (ref 12.0–15.0)
MCH: 31.1 pg (ref 26.0–34.0)
MCHC: 33.3 g/dL (ref 30.0–36.0)
MCV: 93.3 fL (ref 78.0–100.0)
PLATELETS: 164 10*3/uL (ref 150–400)
RBC: 4.18 MIL/uL (ref 3.87–5.11)
RDW: 13.1 % (ref 11.5–15.5)
WBC: 6.1 10*3/uL (ref 4.0–10.5)

## 2014-08-07 LAB — ABO/RH: ABO/RH(D): O POS

## 2014-08-07 LAB — PROTIME-INR
INR: 1.05 (ref 0.00–1.49)
PROTHROMBIN TIME: 13.9 s (ref 11.6–15.2)

## 2014-08-07 LAB — SURGICAL PCR SCREEN
MRSA, PCR: NEGATIVE
STAPHYLOCOCCUS AUREUS: NEGATIVE

## 2014-08-07 LAB — APTT: APTT: 26 s (ref 24–37)

## 2014-08-07 SURGERY — ARTHROPLASTY, KNEE, UNICOMPARTMENTAL
Anesthesia: Spinal | Site: Knee | Laterality: Left

## 2014-08-07 MED ORDER — ONDANSETRON HCL 4 MG/2ML IJ SOLN
4.0000 mg | Freq: Four times a day (QID) | INTRAMUSCULAR | Status: DC | PRN
  Administered 2014-08-08: 4 mg via INTRAVENOUS
  Filled 2014-08-07: qty 2

## 2014-08-07 MED ORDER — MIDAZOLAM HCL 2 MG/2ML IJ SOLN
INTRAMUSCULAR | Status: AC
  Filled 2014-08-07: qty 2

## 2014-08-07 MED ORDER — KETOROLAC TROMETHAMINE 30 MG/ML IJ SOLN
INTRAMUSCULAR | Status: DC | PRN
  Administered 2014-08-07: 30 mg

## 2014-08-07 MED ORDER — SODIUM CHLORIDE 0.9 % IJ SOLN
INTRAMUSCULAR | Status: DC | PRN
  Administered 2014-08-07: 30 mL

## 2014-08-07 MED ORDER — DIPHENHYDRAMINE HCL 25 MG PO TABS
50.0000 mg | ORAL_TABLET | Freq: Four times a day (QID) | ORAL | Status: DC | PRN
  Filled 2014-08-07: qty 2

## 2014-08-07 MED ORDER — VALSARTAN-HYDROCHLOROTHIAZIDE 320-25 MG PO TABS: 1.0000 | ORAL_TABLET | Freq: Every morning | ORAL | Status: DC

## 2014-08-07 MED ORDER — ACETAMINOPHEN 325 MG PO TABS: 650.0000 mg | ORAL_TABLET | Freq: Four times a day (QID) | ORAL | Status: DC | PRN

## 2014-08-07 MED ORDER — PROPOFOL INFUSION 10 MG/ML OPTIME
INTRAVENOUS | Status: DC | PRN
  Administered 2014-08-07: 10 ug/kg/min via INTRAVENOUS

## 2014-08-07 MED ORDER — DOCUSATE SODIUM 100 MG PO CAPS
100.0000 mg | ORAL_CAPSULE | Freq: Two times a day (BID) | ORAL | Status: DC
  Administered 2014-08-07 – 2014-08-09 (×4): 100 mg via ORAL
  Filled 2014-08-07 (×4): qty 1

## 2014-08-07 MED ORDER — MAGNESIUM OXIDE 400 MG PO TABS
400.0000 mg | ORAL_TABLET | Freq: Two times a day (BID) | ORAL | Status: DC
  Administered 2014-08-07 – 2014-08-09 (×4): 400 mg via ORAL
  Filled 2014-08-07 (×7): qty 1

## 2014-08-07 MED ORDER — PHENOL 1.4 % MT LIQD: 1.0000 | OROMUCOSAL | Status: DC | PRN

## 2014-08-07 MED ORDER — ACETAMINOPHEN 650 MG RE SUPP: 650.0000 mg | Freq: Four times a day (QID) | RECTAL | Status: DC | PRN

## 2014-08-07 MED ORDER — SIMVASTATIN 10 MG PO TABS
10.0000 mg | ORAL_TABLET | Freq: Every day | ORAL | Status: DC
  Administered 2014-08-07 – 2014-08-08 (×2): 10 mg via ORAL
  Filled 2014-08-07 (×2): qty 1

## 2014-08-07 MED ORDER — METOCLOPRAMIDE HCL 5 MG/ML IJ SOLN
5.0000 mg | Freq: Three times a day (TID) | INTRAMUSCULAR | Status: DC | PRN
Start: 2014-08-07 — End: 2014-08-09
  Administered 2014-08-08: 5 mg via INTRAVENOUS
  Filled 2014-08-07: qty 2

## 2014-08-07 MED ORDER — BUPIVACAINE-EPINEPHRINE (PF) 0.25% -1:200000 IJ SOLN
INTRAMUSCULAR | Status: DC | PRN
  Administered 2014-08-07: 30 mL

## 2014-08-07 MED ORDER — TIMOLOL MALEATE 0.5 % OP SOLN
1.0000 [drp] | Freq: Every evening | OPHTHALMIC | Status: DC
  Administered 2014-08-07: 1 [drp] via OPHTHALMIC
  Filled 2014-08-07: qty 5

## 2014-08-07 MED ORDER — AMLODIPINE BESYLATE 10 MG PO TABS
10.0000 mg | ORAL_TABLET | Freq: Every morning | ORAL | Status: DC
  Administered 2014-08-09: 10 mg via ORAL
  Filled 2014-08-07: qty 1

## 2014-08-07 MED ORDER — IRBESARTAN 300 MG PO TABS
300.0000 mg | ORAL_TABLET | Freq: Every day | ORAL | Status: DC
  Administered 2014-08-07 – 2014-08-09 (×2): 300 mg via ORAL
  Filled 2014-08-07 (×2): qty 1

## 2014-08-07 MED ORDER — METHOCARBAMOL 500 MG PO TABS
500.0000 mg | ORAL_TABLET | Freq: Four times a day (QID) | ORAL | Status: DC | PRN
  Administered 2014-08-07: 500 mg via ORAL
  Filled 2014-08-07 (×2): qty 1

## 2014-08-07 MED ORDER — SENNA 8.6 MG PO TABS
1.0000 | ORAL_TABLET | Freq: Two times a day (BID) | ORAL | Status: DC
  Administered 2014-08-07 – 2014-08-09 (×4): 8.6 mg via ORAL
  Filled 2014-08-07 (×4): qty 1

## 2014-08-07 MED ORDER — LACTATED RINGERS IV SOLN
INTRAVENOUS | Status: DC | PRN
  Administered 2014-08-07 (×2): via INTRAVENOUS

## 2014-08-07 MED ORDER — MIDAZOLAM HCL 5 MG/5ML IJ SOLN
INTRAMUSCULAR | Status: DC | PRN
  Administered 2014-08-07: 1 mg via INTRAVENOUS
  Administered 2014-08-07: 2 mg via INTRAVENOUS
  Administered 2014-08-07: 1 mg via INTRAVENOUS

## 2014-08-07 MED ORDER — ONDANSETRON HCL 4 MG PO TABS: 4.0000 mg | ORAL_TABLET | Freq: Four times a day (QID) | ORAL | Status: DC | PRN

## 2014-08-07 MED ORDER — GLYCOPYRROLATE 0.2 MG/ML IJ SOLN
INTRAMUSCULAR | Status: DC | PRN
  Administered 2014-08-07: .2 mg via INTRAVENOUS

## 2014-08-07 MED ORDER — METOCLOPRAMIDE HCL 5 MG PO TABS: 5.0000 mg | ORAL_TABLET | Freq: Three times a day (TID) | ORAL | Status: DC | PRN

## 2014-08-07 MED ORDER — MUPIROCIN 2 % EX OINT: 1.0000 "application " | TOPICAL_OINTMENT | Freq: Once | CUTANEOUS | Status: DC

## 2014-08-07 MED ORDER — ALBUMIN HUMAN 5 % IV SOLN
INTRAVENOUS | Status: DC | PRN
  Administered 2014-08-07: 15:00:00 via INTRAVENOUS

## 2014-08-07 MED ORDER — HYDROCHLOROTHIAZIDE 25 MG PO TABS
25.0000 mg | ORAL_TABLET | Freq: Every day | ORAL | Status: DC
Start: 2014-08-07 — End: 2014-08-09
  Administered 2014-08-07 – 2014-08-09 (×2): 25 mg via ORAL
  Filled 2014-08-07 (×2): qty 1

## 2014-08-07 MED ORDER — LACTATED RINGERS IV SOLN: INTRAVENOUS | Status: DC

## 2014-08-07 MED ORDER — LIDOCAINE HCL (CARDIAC) 20 MG/ML IV SOLN
INTRAVENOUS | Status: AC
  Filled 2014-08-07: qty 10

## 2014-08-07 MED ORDER — ONDANSETRON HCL 4 MG/2ML IJ SOLN
INTRAMUSCULAR | Status: DC | PRN
  Administered 2014-08-07 (×2): 4 mg via INTRAVENOUS

## 2014-08-07 MED ORDER — SODIUM CHLORIDE 0.9 % IR SOLN
Status: DC | PRN
  Administered 2014-08-07: 3000 mL

## 2014-08-07 MED ORDER — KETOROLAC TROMETHAMINE 15 MG/ML IJ SOLN
7.5000 mg | Freq: Four times a day (QID) | INTRAMUSCULAR | Status: AC
  Administered 2014-08-07 – 2014-08-08 (×4): 7.5 mg via INTRAVENOUS
  Filled 2014-08-07 (×4): qty 1

## 2014-08-07 MED ORDER — ENOXAPARIN SODIUM 30 MG/0.3ML ~~LOC~~ SOLN
30.0000 mg | SUBCUTANEOUS | Status: DC
  Administered 2014-08-08 – 2014-08-09 (×2): 30 mg via SUBCUTANEOUS
  Filled 2014-08-07 (×2): qty 0.3

## 2014-08-07 MED ORDER — CEFAZOLIN SODIUM-DEXTROSE 2-3 GM-% IV SOLR
2.0000 g | Freq: Four times a day (QID) | INTRAVENOUS | Status: AC
  Administered 2014-08-07 – 2014-08-08 (×2): 2 g via INTRAVENOUS
  Filled 2014-08-07 (×2): qty 50

## 2014-08-07 MED ORDER — FENTANYL CITRATE 0.05 MG/ML IJ SOLN
INTRAMUSCULAR | Status: AC
  Filled 2014-08-07: qty 5

## 2014-08-07 MED ORDER — METHOCARBAMOL 1000 MG/10ML IJ SOLN
500.0000 mg | Freq: Four times a day (QID) | INTRAVENOUS | Status: DC | PRN
  Filled 2014-08-07: qty 5

## 2014-08-07 MED ORDER — HYDROCODONE-ACETAMINOPHEN 5-325 MG PO TABS
1.0000 | ORAL_TABLET | ORAL | Status: DC | PRN
  Administered 2014-08-07 – 2014-08-08 (×3): 2 via ORAL
  Administered 2014-08-08: 1 via ORAL
  Filled 2014-08-07: qty 1
  Filled 2014-08-07 (×5): qty 2

## 2014-08-07 MED ORDER — SODIUM CHLORIDE 0.9 % IV SOLN
10.0000 mg | INTRAVENOUS | Status: DC | PRN
  Administered 2014-08-07: 10 ug/min via INTRAVENOUS

## 2014-08-07 MED ORDER — ACETAMINOPHEN 10 MG/ML IV SOLN
INTRAVENOUS | Status: DC | PRN
  Administered 2014-08-07: 1000 mg via INTRAVENOUS

## 2014-08-07 MED ORDER — DEXAMETHASONE SODIUM PHOSPHATE 10 MG/ML IJ SOLN
10.0000 mg | Freq: Once | INTRAMUSCULAR | Status: AC
  Administered 2014-08-08: 10 mg via INTRAVENOUS
  Filled 2014-08-07: qty 1

## 2014-08-07 MED ORDER — LIDOCAINE HCL (CARDIAC) 20 MG/ML IV SOLN
INTRAVENOUS | Status: DC | PRN
  Administered 2014-08-07: 100 mg via INTRAVENOUS

## 2014-08-07 MED ORDER — 0.9 % SODIUM CHLORIDE (POUR BTL) OPTIME
TOPICAL | Status: DC | PRN
  Administered 2014-08-07: 1000 mL

## 2014-08-07 MED ORDER — SODIUM CHLORIDE 0.9 % IV SOLN
INTRAVENOUS | Status: DC
  Administered 2014-08-07 – 2014-08-09 (×3): via INTRAVENOUS

## 2014-08-07 MED ORDER — GLIMEPIRIDE 4 MG PO TABS
4.0000 mg | ORAL_TABLET | Freq: Two times a day (BID) | ORAL | Status: DC
  Administered 2014-08-07 – 2014-08-09 (×4): 4 mg via ORAL
  Filled 2014-08-07 (×4): qty 1

## 2014-08-07 MED ORDER — BUPIVACAINE-EPINEPHRINE (PF) 0.25% -1:200000 IJ SOLN
INTRAMUSCULAR | Status: AC
  Filled 2014-08-07: qty 30

## 2014-08-07 MED ORDER — FENTANYL CITRATE 0.05 MG/ML IJ SOLN
INTRAMUSCULAR | Status: DC | PRN
  Administered 2014-08-07 (×3): 50 ug via INTRAVENOUS

## 2014-08-07 MED ORDER — ACETAMINOPHEN 10 MG/ML IV SOLN
INTRAVENOUS | Status: AC
  Filled 2014-08-07: qty 100

## 2014-08-07 MED ORDER — MUPIROCIN 2 % EX OINT
TOPICAL_OINTMENT | CUTANEOUS | Status: AC
  Administered 2014-08-07: 1 via NASAL
  Filled 2014-08-07: qty 22

## 2014-08-07 MED ORDER — HYDROMORPHONE HCL 1 MG/ML IJ SOLN: 0.2500 mg | INTRAMUSCULAR | Status: DC | PRN

## 2014-08-07 MED ORDER — MENTHOL 3 MG MT LOZG: 1.0000 | LOZENGE | OROMUCOSAL | Status: DC | PRN

## 2014-08-07 MED ORDER — DEXAMETHASONE SODIUM PHOSPHATE 4 MG/ML IJ SOLN
INTRAMUSCULAR | Status: AC
  Filled 2014-08-07: qty 1

## 2014-08-07 SURGICAL SUPPLY — 59 items
BANDAGE ELASTIC 6 VELCRO ST LF (GAUZE/BANDAGES/DRESSINGS) IMPLANT
BANDAGE ESMARK 6X9 LF (GAUZE/BANDAGES/DRESSINGS) ×1 IMPLANT
BLADE SAW SGTL 13.0X1.19X90.0M (BLADE) ×3 IMPLANT
BNDG ELASTIC 6X10 VLCR STRL LF (GAUZE/BANDAGES/DRESSINGS) ×3 IMPLANT
BNDG ESMARK 6X9 LF (GAUZE/BANDAGES/DRESSINGS) ×3
BONE CEMENT PALACOS R-G (Orthopedic Implant) ×3 IMPLANT
BOWL SMART MIX CTS (DISPOSABLE) IMPLANT
CAPT KNEE PARTIAL 2 ×3 IMPLANT
CEMENT BONE PALACOS R-G (Orthopedic Implant) ×1 IMPLANT
CHLORAPREP W/TINT 26ML (MISCELLANEOUS) ×6 IMPLANT
CUFF TOURNIQUET SINGLE 34IN LL (TOURNIQUET CUFF) ×3 IMPLANT
DECANTER SPIKE VIAL GLASS SM (MISCELLANEOUS) IMPLANT
DERMABOND ADVANCED (GAUZE/BANDAGES/DRESSINGS) ×2
DERMABOND ADVANCED .7 DNX12 (GAUZE/BANDAGES/DRESSINGS) ×1 IMPLANT
DRAPE EXTREMITY T 121X128X90 (DRAPE) ×3 IMPLANT
DRAPE POUCH INSTRU U-SHP 10X18 (DRAPES) ×3 IMPLANT
DRAPE U-SHAPE 47X51 STRL (DRAPES) ×3 IMPLANT
DRSG AQUACEL AG ADV 3.5X10 (GAUZE/BANDAGES/DRESSINGS) ×3 IMPLANT
DURAPREP 26ML APPLICATOR (WOUND CARE) IMPLANT
ELECT REM PT RETURN 9FT ADLT (ELECTROSURGICAL) ×3
ELECTRODE REM PT RTRN 9FT ADLT (ELECTROSURGICAL) ×1 IMPLANT
FACESHIELD WRAPAROUND (MASK) IMPLANT
GLOVE BIOGEL PI IND STRL 7.5 (GLOVE) ×1 IMPLANT
GLOVE BIOGEL PI IND STRL 8.5 (GLOVE) ×1 IMPLANT
GLOVE BIOGEL PI INDICATOR 7.5 (GLOVE) ×2
GLOVE BIOGEL PI INDICATOR 8.5 (GLOVE) ×2
GLOVE ECLIPSE 8.0 STRL XLNG CF (GLOVE) ×3 IMPLANT
GLOVE ORTHO TXT STRL SZ7.5 (GLOVE) ×6 IMPLANT
GOWN SPEC L3 XXLG W/TWL (GOWN DISPOSABLE) ×3 IMPLANT
GOWN STRL REUS W/TWL LRG LVL3 (GOWN DISPOSABLE) ×3 IMPLANT
HANDPIECE INTERPULSE COAX TIP (DISPOSABLE) ×2
HOOD SURGICAL BLUE (PROTECTIVE WEAR) ×9 IMPLANT
KIT BASIN OR (CUSTOM PROCEDURE TRAY) ×3 IMPLANT
LIQUID BAND (GAUZE/BANDAGES/DRESSINGS) IMPLANT
MANIFOLD NEPTUNE II (INSTRUMENTS) ×3 IMPLANT
MARKER SKIN DUAL TIP RULER LAB (MISCELLANEOUS) ×3 IMPLANT
NDL SAFETY ECLIPSE 18X1.5 (NEEDLE) ×2 IMPLANT
NEEDLE HYPO 18GX1.5 SHARP (NEEDLE) ×4
PACK BLADE SAW RECIP 70 3 PT (BLADE) ×3 IMPLANT
PACK TOTAL JOINT (CUSTOM PROCEDURE TRAY) ×3 IMPLANT
PADDING CAST COTTON 6X4 STRL (CAST SUPPLIES) ×6 IMPLANT
SET HNDPC FAN SPRY TIP SCT (DISPOSABLE) ×1 IMPLANT
SET PAD KNEE POSITIONER (MISCELLANEOUS) IMPLANT
SUCTION FRAZIER TIP 10 FR DISP (SUCTIONS) ×3 IMPLANT
SUT MNCRL AB 3-0 PS2 18 (SUTURE) ×3 IMPLANT
SUT MNCRL AB 4-0 PS2 18 (SUTURE) IMPLANT
SUT MON AB 2-0 CT1 36 (SUTURE) ×3 IMPLANT
SUT VIC AB 1 CT1 27 (SUTURE) ×2
SUT VIC AB 1 CT1 27XBRD ANBCTR (SUTURE) ×1 IMPLANT
SUT VIC AB 1 CT1 36 (SUTURE) ×3 IMPLANT
SUT VIC AB 2-0 CT1 27 (SUTURE) ×6
SUT VIC AB 2-0 CT1 TAPERPNT 27 (SUTURE) ×3 IMPLANT
SUT VLOC 180 0 24IN GS25 (SUTURE) ×3 IMPLANT
SYR 50ML LL SCALE MARK (SYRINGE) ×6 IMPLANT
TOWEL OR 17X26 10 PK STRL BLUE (TOWEL DISPOSABLE) ×3 IMPLANT
TOWEL OR NON WOVEN STRL DISP B (DISPOSABLE) IMPLANT
TRAY FOLEY CATH 14FRSI W/METER (CATHETERS) ×3 IMPLANT
VACUUM MIXING BOWL WITH ADAPTER ×3 IMPLANT
WRAP KNEE MAXI GEL POST OP (GAUZE/BANDAGES/DRESSINGS) ×3 IMPLANT

## 2014-08-07 NOTE — Progress Notes (Signed)
Pt evaluated in PACU. States B/L LEs still numb from spinal.  NAD LLE: drsg c/d/i. Foot cool. 1+ DP present (2+ preop). Triphasic DP and PT doppler signal. Unable to test motor/sensory due to spinal.  ABI: 130/120 > 1  A: POD#0 L medial UKA Will warm foot with blankets ABI > 1 Continue to monitor

## 2014-08-07 NOTE — Anesthesia Procedure Notes (Addendum)
Spinal Patient location during procedure: OR Start time: 08/07/2014 1:15 PM End time: 08/07/2014 1:21 PM Staffing Anesthesiologist: Gaynelle AduFITZGERALD, WILLIAM Performed by: anesthesiologist  Preanesthetic Checklist Completed: patient identified, surgical consent, pre-op evaluation, timeout performed, IV checked, risks and benefits discussed and monitors and equipment checked Spinal Block Patient position: sitting Prep: Betadine Patient monitoring: cardiac monitor, continuous pulse ox and blood pressure Approach: midline Location: L3-4 Injection technique: single-shot Needle Needle type: Pencan  Needle gauge: 24 G Needle length: 9 cm Assessment Sensory level: T6 Additional Notes Functioning IV was confirmed and monitors were applied. Sterile prep and drape, including hand hygiene and sterile gloves were used. The patient was positioned and the spine was prepped. The skin was anesthetized with lidocaine.  Free flow of clear CSF was obtained prior to injecting local anesthetic into the CSF.  The spinal needle aspirated freely following injection.  The needle was carefully withdrawn.  The patient tolerated the procedure well.   Procedure Name: MAC Date/Time: 08/07/2014 1:21 PM Performed by: Wray KearnsFOLEY, Lazarus [place]bury A Pre-anesthesia Checklist: Patient identified, Timeout performed, Emergency Drugs available, Suction available and Patient being monitored Patient Re-evaluated:Patient Re-evaluated prior to inductionOxygen Delivery Method: Nasal cannula Intubation Type: IV induction Placement Confirmation: positive ETCO2 Dental Injury: Teeth and Oropharynx as per pre-operative assessment

## 2014-08-07 NOTE — Brief Op Note (Signed)
08/07/2014  4:18 PM  PATIENT:  Haley Mayer  81 y.o. female  PRE-OPERATIVE DIAGNOSIS:  left knee stress fracture  POST-OPERATIVE DIAGNOSIS:  left knee stress fracture  PROCEDURE:  Procedure(s): LEFT MEDIAL PARTIAL KNEE REPLACEMENT  (Left)  SURGEON:  Surgeon(s) and Role:    * Samson FredericBrian Brande Uncapher, MD - Primary  PHYSICIAN ASSISTANT:   ASSISTANTS: Hart CarwinJustin Queen, RNFA   ANESTHESIA:   local, spinal and IV sedation  EBL:  Total I/O In: 2000 [I.V.:1500; IV Piggyback:500] Out: 150 [Urine:150]  BLOOD ADMINISTERED:none  DRAINS: none   LOCAL MEDICATIONS USED:  MARCAINE     SPECIMEN:  No Specimen  DISPOSITION OF SPECIMEN:  N/A  COUNTS:  YES  TOURNIQUET:   Total Tourniquet Time Documented: Thigh (Left) - 120 minutes Total: Thigh (Left) - 120 minutes   DICTATION: .Other Dictation: Dictation Number [id number]  PLAN OF CARE: Admit to inpatient   PATIENT DISPOSITION:  PACU - hemodynamically stable.   Delay start of Pharmacological VTE agent (>24hrs) due to surgical blood loss or risk of bleeding: no

## 2014-08-07 NOTE — Anesthesia Postprocedure Evaluation (Signed)
  Anesthesia Post-op Note  Patient: Haley Mayer  Procedure(s) Performed: Procedure(s): LEFT MEDIAL PARTIAL KNEE REPLACEMENT  (Left)  Patient Location: PACU  Anesthesia Type: Spinal   Level of Consciousness: awake, alert  and oriented  Airway and Oxygen Therapy: Patient Spontanous Breathing  Post-op Pain: none  Post-op Assessment: Post-op Vital signs reviewed  Post-op Vital Signs: Reviewed  Last Vitals:  Filed Vitals:   08/07/14 1702  BP: 120/44  Pulse: 51  Temp:   Resp: 17    Complications: No apparent anesthesia complications

## 2014-08-07 NOTE — Anesthesia Preprocedure Evaluation (Signed)
Anesthesia Evaluation  Patient identified by MRN, date of birth, ID band Patient awake    Reviewed: Allergy & Precautions, H&P , NPO status , Patient's Chart, lab work & pertinent test results  Airway Mallampati: III  TM Distance: >3 FB Neck ROM: Full    Dental no notable dental hx. (+) Teeth Intact, Dental Advisory Given   Pulmonary neg pulmonary ROS,  breath sounds clear to auscultation  Pulmonary exam normal       Cardiovascular hypertension, Pt. on medications Rhythm:Regular Rate:Normal     Neuro/Psych negative neurological ROS  negative psych ROS   GI/Hepatic negative GI ROS, Neg liver ROS,   Endo/Other  diabetes, Type 2, Oral Hypoglycemic AgentsMorbid obesity  Renal/GU Renal InsufficiencyRenal disease  negative genitourinary   Musculoskeletal  (+) Arthritis -, Osteoarthritis,    Abdominal   Peds  Hematology negative hematology ROS (+)   Anesthesia Other Findings   Reproductive/Obstetrics negative OB ROS                             Anesthesia Physical Anesthesia Plan  ASA: III  Anesthesia Plan: Spinal   Post-op Pain Management:    Induction: Intravenous  Airway Management Planned: Simple Face Mask  Additional Equipment:   Intra-op Plan:   Post-operative Plan:   Informed Consent: I have reviewed the patients History and Physical, chart, labs and discussed the procedure including the risks, benefits and alternatives for the proposed anesthesia with the patient or authorized representative who has indicated his/her understanding and acceptance.   Dental advisory given  Plan Discussed with: CRNA  Anesthesia Plan Comments:         Anesthesia Quick Evaluation

## 2014-08-07 NOTE — H&P (Signed)
TOTAL KNEE ADMISSION H&P  Patient is being admitted for left medial partial vs total knee arthroplasty.  Subjective:  Chief Complaint:left knee pain.  HPI: Haley Mayer R Makela, 81 y.o. female, has a history of pain and functional disability in the left knee due to AVN of the MFC and arthritis and has failed non-surgical conservative treatments for greater than 12 weeks to includeuse of assistive devices, weight reduction as appropriate and activity modification.  Onset of symptoms was abrupt, starting 1 years ago with gradually worsening course since that time. The patient noted no past surgery on the left knee(s).  Patient currently rates pain in the left knee(s) at 10 out of 10 with activity. Patient has night pain, worsening of pain with activity and weight bearing, pain that interferes with activities of daily living and pain with passive range of motion.  Patient has evidence of joint space narrowing and subchondral collapse of the MFC by imaging studies. This patient has had avascular necrosis of the knee. There is no active infection.  There are no active problems to display for this patient.  Past Medical History  Diagnosis Date  . Hypertension   . Hyperlipidemia   . Chronic kidney disease     function no 100% per patient   . Arthritis   . Diabetes mellitus without complication     Type II  . Glaucoma     Past Surgical History  Procedure Laterality Date  . Tonsillectomy    . Cholecystectomy    . Appendectomy    . Abdominal hysterectomy      No prescriptions prior to admission   Allergies  Allergen Reactions  . Aspirin Nausea And Vomiting    REGULAR STRENGTH= Also upset stomach   . Iohexol Nausea And Vomiting  . Loperamide Hives  . Other Other (See Comments)    Tomatoes--acid  . Primidone Other (See Comments)    unknown  . Vasotec [Enalapril]     unknown    History  Substance Use Topics  . Smoking status: Never Smoker   . Smokeless tobacco: Never Used  . Alcohol  Use: No    History reviewed. No pertinent family history.   Review of Systems  HENT: Negative.   Eyes: Negative.   Respiratory: Negative.  Negative for shortness of breath.   Cardiovascular: Negative.  Negative for chest pain.  Gastrointestinal: Negative.   Genitourinary: Negative.   Musculoskeletal: Positive for joint pain.  Skin: Negative.   Neurological: Negative.   Endo/Heme/Allergies: Negative.   Psychiatric/Behavioral: Negative.     Objective:  Physical Exam  Vitals reviewed. Constitutional: She is oriented to person, place, and time. She appears well-developed and well-nourished.  HENT:  Head: Normocephalic and atraumatic.  Eyes: Pupils are equal, round, and reactive to light.  Neck: Normal range of motion. Neck supple.  Cardiovascular: Normal rate, regular rhythm, normal heart sounds and intact distal pulses.   Respiratory: Breath sounds normal.  GI: Soft. Bowel sounds are normal. She exhibits no distension. There is no tenderness.  Genitourinary:  deferred  Musculoskeletal:       Left knee: She exhibits swelling. Tenderness found. Medial joint line tenderness noted.  Neurological: She is alert and oriented to person, place, and time. She has normal reflexes.  Skin: Skin is warm and dry.  Psychiatric: She has a normal mood and affect. Her behavior is normal. Judgment and thought content normal.    Vital signs in last 24 hours:    Labs:   Estimated body mass index  is 40.08 kg/(m^2) as calculated from the following:   Height as of 07/09/14:  (1.549 m).   Weight as of 07/09/14: 96.163 kg (212 lb).   Imaging Review Plain radiographs demonstrate mild degenerative joint disease of the left knee(s) with collapse of the MFC. The overall alignment ismild varus. The bone quality appears to be adequate for age and reported activity level.  Assessment/Plan:  AVN of MFC, left knee   The patient history, physical examination, clinical judgment of the provider and  imaging studies are consistent with end stage degenerative joint disease of the left knee(s) and medial partial vs total knee arthroplasty is deemed medically necessary. The treatment options including medical management, injection therapy arthroscopy and arthroplasty were discussed at length. The risks and benefits of total knee arthroplasty were presented and reviewed. The risks due to aseptic loosening, infection, stiffness, patella tracking problems, thromboembolic complications and other imponderables were discussed. The patient acknowledged the explanation, agreed to proceed with the plan and consent was signed. Patient is being admitted for inpatient treatment for surgery, pain control, PT, OT, prophylactic antibiotics, VTE prophylaxis, progressive ambulation and ADL's and discharge planning. The patient is planning to be discharged home with home health services

## 2014-08-07 NOTE — Transfer of Care (Signed)
Immediate Anesthesia Transfer of Care Note  Patient: Haley MaplesAlice R Patlan  Procedure(s) Performed: Procedure(s): LEFT MEDIAL PARTIAL KNEE REPLACEMENT  (Left)  Patient Location: PACU  Anesthesia Type:Spinal  Level of Consciousness: awake, alert  and patient cooperative  Airway & Oxygen Therapy: Patient Spontanous Breathing and Patient connected to face mask oxygen  Post-op Assessment: Report given to RN and Post -op Vital signs reviewed and stable  Post vital signs: Reviewed and stable  Last Vitals:  Filed Vitals:   08/07/14 1054  BP: 142/57  Pulse: 69  Temp: 36.9 C  Resp: 18    Complications: No apparent anesthesia complications

## 2014-08-08 ENCOUNTER — Other Ambulatory Visit: Payer: Self-pay

## 2014-08-08 ENCOUNTER — Encounter (HOSPITAL_COMMUNITY): Payer: Self-pay | Admitting: General Practice

## 2014-08-08 DIAGNOSIS — R001 Bradycardia, unspecified: Secondary | ICD-10-CM | POA: Diagnosis not present

## 2014-08-08 DIAGNOSIS — I447 Left bundle-branch block, unspecified: Secondary | ICD-10-CM | POA: Diagnosis present

## 2014-08-08 DIAGNOSIS — I441 Atrioventricular block, second degree: Secondary | ICD-10-CM

## 2014-08-08 LAB — BASIC METABOLIC PANEL
Anion gap: 9 (ref 5–15)
BUN: 14 mg/dL (ref 6–23)
CO2: 25 mmol/L (ref 19–32)
Calcium: 8.4 mg/dL (ref 8.4–10.5)
Chloride: 102 mmol/L (ref 96–112)
Creatinine, Ser: 1.08 mg/dL (ref 0.50–1.10)
GFR calc Af Amer: 54 mL/min — ABNORMAL LOW (ref >90)
GFR calc non Af Amer: 46 mL/min — ABNORMAL LOW (ref >90)
GLUCOSE: 210 mg/dL — AB (ref 70–99)
Potassium: 3.6 mmol/L (ref 3.5–5.1)
Sodium: 136 mmol/L (ref 135–145)

## 2014-08-08 LAB — GLUCOSE, CAPILLARY
Glucose-Capillary: 220 mg/dL — ABNORMAL HIGH (ref 70–99)
Glucose-Capillary: 216 mg/dL — ABNORMAL HIGH (ref 70–99)
Glucose-Capillary: 278 mg/dL — ABNORMAL HIGH (ref 70–99)
Glucose-Capillary: 260 mg/dL — ABNORMAL HIGH (ref 70–99)

## 2014-08-08 LAB — TSH: TSH: 1.028 u[IU]/mL (ref 0.350–4.500)

## 2014-08-08 LAB — CBC
HEMATOCRIT: 29.4 % — AB (ref 36.0–46.0)
Hemoglobin: 9.9 g/dL — ABNORMAL LOW (ref 12.0–15.0)
MCH: 30.7 pg (ref 26.0–34.0)
MCHC: 33.7 g/dL (ref 30.0–36.0)
MCV: 91 fL (ref 78.0–100.0)
PLATELETS: 144 10*3/uL — AB (ref 150–400)
RBC: 3.23 MIL/uL — ABNORMAL LOW (ref 3.87–5.11)
RDW: 12.7 % (ref 11.5–15.5)
WBC: 9.6 10*3/uL (ref 4.0–10.5)

## 2014-08-08 LAB — TROPONIN I: Troponin I: <0.03 ng/mL (ref <0.031)

## 2014-08-08 MED ORDER — INSULIN ASPART 100 UNIT/ML ~~LOC~~ SOLN
0.0000 [IU] | Freq: Three times a day (TID) | SUBCUTANEOUS | Status: DC
  Administered 2014-08-08: 8 [IU] via SUBCUTANEOUS
  Administered 2014-08-09: 3 [IU] via SUBCUTANEOUS
  Administered 2014-08-09: 5 [IU] via SUBCUTANEOUS

## 2014-08-08 MED ORDER — SODIUM CHLORIDE 0.9 % IV BOLUS (SEPSIS)
250.0000 mL | Freq: Once | INTRAVENOUS | Status: AC
  Administered 2014-08-08: 250 mL via INTRAVENOUS

## 2014-08-08 MED ORDER — SODIUM CHLORIDE 0.9 % IV SOLN: INTRAVENOUS | Status: AC

## 2014-08-08 NOTE — Op Note (Signed)
NAME:  Haley Mayer, Haley Mayer NO.:  [id number]  MEDICAL RECORD NO.:  [id number]  LOCATION:                                FACILITY:  [facility]  PHYSICIAN:  Samson Frederic, MD     DATE OF BIRTH:  November 19, 1929  DATE OF PROCEDURE:  08/07/2014 DATE OF DISCHARGE:                              OPERATIVE REPORT   SURGEON:  Samson Frederic, MD.  ASSISTANT: Hart Carwin, RNFA.  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  PROCEDURE PERFORMED:  Left medial unicompartment arthroplasty.  IMPLANTS: 1. Biomet Oxford Twin Peg femoral component size small. 2. Left medial tibial tray size B. 3. Oxford anatomic meniscal bearing, left, size 9 mm. 4. Palacos R+G bone cement.  ANESTHESIA:  Spinal plus IV sedation.  TUBES AND DRAINS:  None.  SPECIMENS:  None.  COMPLICATIONS:  None.  DISPOSITION:  Stable to PACU.  ANTIBIOTICS:  2 g Ancef.  INDICATIONS:  The patient is an 81 year old female, who has suffered from avascular necrosis of the medial femoral condyle of the left knee for over a year.  She was managed conservatively with an unloader brace and modified weightbearing.  She had progressive medial compartment pain and x-ray did reveal progressive collapse of the medial femoral condyle. Risks, benefits, and alternatives of the procedure were explained, and she elected to proceed.  DESCRIPTION OF PROCEDURE:  In detail, the patient was corrected identified in the preoperative holding area using 2 patient identifiers. Surgical site was marked by myself.  The patient was taken to the operating room and spinal anesthesia was induced. She then underwent an IV sedation and placement of Foley catheter.  She was positioned on the operating room table and nonsterile tourniquet was applied on the left thigh.  Left lower extremity was prepped and draped in  normal sterile surgical fashion.  I made a standard anterior approach to the knee utilizing midvastus arthrotomy.  Upon entering the knee joint, I was able to see her medial femoral condyle.  She had a loose flap of cartilage and the bone was little bit depressed.  Her ACL was intact. Her patellar femoral cartilage and lateral compartment cartilage were intact.  I chose to proceed with medial compartmental arthroplasty.  The femoral spoon was placed, small 1 mm, and the extramedullary tibial rod was placed and this was connected with the G clamp.  I pinned the proximal tibia guide, and then I made the standard cut.  I placed the femoral sizer guide with the 4 mm spacer, and she was tight in flexion. 2 mm of additional tibial resection was performed.  A 4 mm guide was now a little loose in flexion.  The intramedullary hole was drilled and the rod was placed.  The connector was used to place the femoral alignment jig.  The 2 Peg holes were drilled and then I milled for a zero.  I placed the femoral trial, which did not fit well anteriorly.  I used a rongeur to remove excessive femoral bone anteriorly and the component was replaced in extension.  She was at  about 6 in flexion, and in extension, and I could get in 1. I then milled 4 mm of bone because I wanted to be on the conservative side.  I then replaced the femoral trial, and I sized the tibial trial and again trialed with the appropriate bearing.  The ACL was functional.  The MCL had appropriate tension and was in continuity.  I then pinned the tibial trial and then the slough was cut with a toothbrush saw and cleaned with a nose picker.  The cement was mixed on the back table and I pressurized the cement into the tibia from back to front using an osteotome.  The tibial component was cemented in place and extra cement was cleared, and I then cemented the femoral component in place and I placed a trial bearing and the knee was  pressurized in extension.  Once the cement was fully hardened, I then trialed bearings and 9 mm bearing gave about 1 to 2 mm of uniform laxity in extension and flexion.  The flexion and extension gaps were perfectly balanced.  ACL was appropriately tied in extension.  MCL was well tensioned throughout range of motion.  The knee was copiously irrigated with saline.  We put the tourniquet down.  The arthrotomy was closed with #1 Vicryl and 0 V-Loc.  The subcutaneous tissues were closed with 2-0 interrupted Vicryl, 2-0 Monocryl for the deep dermal layer, and 3-0 running Monocryl subcuticular stitch. Dermabond was applied to the skin.  Once the glue was hardened, Aquacel dressing was applied, followed by a sterile dressing.  The patient was aroused from anesthesia and transferred to the bed and taken to the PACU in stable condition.  Sponge, needle, and instruments counts were correct at the end of the case x2.  There were no known complications.          ______________________________ Samson FredericBrian Aaria Happ, MD     BS/MEDQ  D:  08/07/2014  T:  08/08/2014  Job:  [id number]

## 2014-08-08 NOTE — Consult Note (Signed)
Admit date: 08/07/2014 Referring Physician  Dr. Linna CapriceSwinteck Primary Cardiologist  None Reason for Consultation  Heart block HPI: This is an 81yo female with a history of HTN, dyslipidemia, CKD and type II DM who has a history of pain and functional disability in the left knee due to AVN of the MFC and arthritis and has failed non-surgical conservative treatments.  She was admitted for inpatient treatment and underwent left medial partial knee replacement.  She was noted overnight to be bradycardic into the 30's but was asymptomatic.  Heart monitor showed intermittent type I second degree AV block.  She is not on any AVN blocking agents but dose take Timpotic eye gtts which can be systemically absorbed.  Cardiology is now asked to consult.      PMH:   Past Medical History  Diagnosis Date  . Hypertension   . Hyperlipidemia   . Chronic kidney disease     function no 100% per patient   . Arthritis   . Diabetes mellitus without complication     Type II  . Glaucoma      PSH:   Past Surgical History  Procedure Laterality Date  . Tonsillectomy    . Cholecystectomy    . Appendectomy    . Abdominal hysterectomy      Allergies:  Aspirin; Iohexol; Loperamide; Other; Primidone; and Vasotec Prior to Admit Meds:   Prescriptions prior to admission  Medication Sig Dispense Refill Last Dose  . amLODipine (NORVASC) 10 MG tablet Take 10 mg by mouth every morning.    08/07/2014 at Unknown time  . aspirin EC 81 MG tablet Take 81 mg by mouth every morning.    Past Week at Unknown time  . Calcium Carbonate-Vit D-Min (CALCIUM 1200 PO) Take 1 tablet by mouth every morning.   08/06/2014 at Unknown time  . cholecalciferol (VITAMIN D) 1000 UNITS tablet Take 1,000 Units by mouth every morning.    08/06/2014 at Unknown time  . diphenhydrAMINE (BENADRYL) 25 MG tablet Take 50 mg by mouth every 6 (six) hours as needed for itching or allergies.   Past Week at Unknown time  . glimepiride (AMARYL) 4 MG tablet Take 4  mg by mouth 2 (two) times daily.    08/06/2014 at Unknown time  . magnesium oxide (MAG-OX) 400 MG tablet Take 400 mg by mouth 2 (two) times daily.    08/06/2014 at Unknown time  . multivitamin-iron-minerals-folic acid (CENTRUM) chewable tablet Chew 1 tablet by mouth every morning.    08/06/2014 at Unknown time  . simvastatin (ZOCOR) 10 MG tablet Take 10 mg by mouth at bedtime.    08/06/2014 at Unknown time  . timolol (TIMOPTIC) 0.5 % ophthalmic solution Place 1 drop into both eyes every evening.    08/06/2014 at Unknown time  . valsartan-hydrochlorothiazide (DIOVAN-HCT) 320-25 MG per tablet Take 1 tablet by mouth every morning.    08/06/2014 at Unknown time  . acetaminophen (TYLENOL) 500 MG tablet Take 1,000 mg by mouth every 6 (six) hours as needed for moderate pain or headache.   Unknown at Unknown time   Fam HX:   History reviewed. No pertinent family history. Social HX:    History   Social History  . Marital Status: Widowed    Spouse Name: N/A  . Number of Children: N/A  . Years of Education: N/A   Occupational History  . Not on file.   Social History Main Topics  . Smoking status: Never Smoker   . Smokeless  tobacco: Never Used  . Alcohol Use: No  . Drug Use: No  . Sexual Activity: Not on file   Other Topics Concern  . Not on file   Social History Narrative     ROS:  All 11 ROS were addressed and are negative except what is stated in the HPI  Physical Exam: Blood pressure 119/48, pulse 76, temperature 97.5 F (36.4 C), temperature source Oral, resp. rate 18, height  (1.549 m), weight 211 lb (95.709 kg), SpO2 94 %.    General: Well developed, well nourished, in no acute distress Head: Eyes PERRLA, No xanthomas.   Normal cephalic and atramatic  Lungs:   Clear bilaterally to auscultation and percussion. Heart:   HRRR S1 S2 Pulses are 2+ & equal.            No carotid bruit. No JVD.  No abdominal bruits. No femoral bruits. Abdomen: Bowel sounds are positive, abdomen soft  and non-tender without masses  Extremities:   No clubbing, cyanosis or edema.  DP +1 Neuro: Alert and oriented X 3. Psych:  Good affect, responds appropriately    Labs:   Lab Results  Component Value Date   WBC 9.6 08/08/2014   HGB 9.9* 08/08/2014   HCT 29.4* 08/08/2014   MCV 91.0 08/08/2014   PLT 144* 08/08/2014    Recent Labs Lab 08/07/14 1154 08/08/14 0509  NA 139 136  K 4.1 3.6  CL 102 102  CO2 29 25  BUN 18 14  CREATININE 1.16* 1.08  CALCIUM 9.5 8.4  PROT 7.1  --   BILITOT 1.0  --   ALKPHOS 71  --   ALT 14  --   AST 26  --   GLUCOSE 155* 210*   No results found for: PTT Lab Results  Component Value Date   INR 1.05 08/07/2014   INR 0.97 07/09/2014   Lab Results  Component Value Date   TROPONINI <0.03 08/08/2014    No results found for: CHOL No results found for: HDL No results found for: LDLCALC No results found for: TRIG No results found for: CHOLHDL No results found for: LDLDIRECT    Radiology:  Dg Knee Left Port  08/07/2014   CLINICAL DATA:  Medial compartment arthroplasty.  EXAM: PORTABLE LEFT KNEE - 1-2 VIEW  COMPARISON:  None.  FINDINGS: New uncomplicated LEFT medial compartment arthroplasty. Alignment is anatomic. Expected postsurgical changes in the soft tissues.  IMPRESSION: New uncomplicated LEFT medial compartment arthroplasty.   Electronically Signed   By: Andreas Newport M.D.   On: 08/07/2014 16:51    EKG:  NSR with type I second degree AV block and LBBB  ASSESSMENT/PLAN:  1.  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  She clearly has evidence of conduction system disease on EKG with not only AV block but LBBB.  The timoptic eye gtts that she takes can be absorbed systemically and cause heart block in patients with conduction system disease.  Will stop Timoptic and see if AV block resolves.  There is no evidence of higher grade AV block at this time and patient is asymptomatic so PPM not indicated at this time unless she has  evidence of higher grade block with symptoms off Timoptic eye gtts.  Will follow with you.  Check TSH.      Quintella Reichert, MD  08/08/2014  8:52 AM

## 2014-08-08 NOTE — Progress Notes (Signed)
Utilization review completed.  

## 2014-08-08 NOTE — Progress Notes (Signed)
Inpatient Diabetes Program Recommendations  AACE/ADA: New Consensus Statement on Inpatient Glycemic Control (2013)  Target Ranges:  Prepandial:   less than 140 mg/dL      Peak postprandial:   less than 180 mg/dL (1-2 hours)      Critically ill patients:  140 - 180 mg/dL   Results for Haley Mayer, Haley Mayer (MRN [id number]) as of 08/08/2014 09:25  Ref. Range 08/08/2014 09:16  Glucose-Capillary Latest Range: 70-99 mg/dL 865220 (H)   Reason for Visit: Left medial partial knee replacement  Diabetes history: DM 2 Outpatient Diabetes medications: Amaryl 4 mg BID Current orders for Inpatient glycemic control: Amaryl 4 mg BID  Inpatient Diabetes Program Recommendations  Correction (SSI): Patient recieved one dose of Decadron 4 mg yesterday 3/23 in the afternoon. Patient to get Decadron 10 mg once today 3/24. Patient having elevated glucose levels. While inpatient, please order CBG's and Novolog 0-9 units ACHS.   Thanks,  Christena DeemShannon Nikala Walsworth RN, MSN, [facility] Inpatient Diabetes Coordinator Team Pager [phone]

## 2014-08-08 NOTE — Evaluation (Signed)
Physical Therapy Evaluation Patient Details Name: Haley Mayer MRN: [id number] DOB: 1930-04-06 Today's Date: 08/08/2014   History of Present Illness  pt is a 81 y.o. female s/p Lt TKA.  Clinical Impression  Pt is s/p Lt TKA POD#1 resulting in the deficits listed below (see PT Problem List).  Pt will benefit from skilled PT to increase their independence and safety with mobility to allow discharge to the venue listed below. Pt reports her daughter plans to stay with her, she is very motivated to return to independence.      Follow Up Recommendations Home health PT;Supervision/Assistance - 24 hour    Equipment Recommendations  Rolling walker with 5" wheels    Recommendations for Other Services OT consult     Precautions / Restrictions Precautions Precautions: Fall;Knee Precaution Comments: cues on knee positioning  Restrictions Weight Bearing Restrictions: Yes LLE Weight Bearing: Weight bearing as tolerated      Mobility  Bed Mobility Overal bed mobility: Needs Assistance Bed Mobility: Supine to Sit     Supine to sit: Min guard;HOB elevated     General bed mobility comments: min guard to control Lt LE off EOB due to pain; cues for hand placement and sequencing  Transfers Overall transfer level: Needs assistance Equipment used: Rolling walker (2 wheeled) Transfers: Sit to/from [employer] Sit to Stand: Min guard Stand pivot transfers: Min assist       General transfer comment: min guard to steady with standing; min (A) to manage RW with pivtoal steps; cues for hand placement and technique   Ambulation/Gait Ambulation/Gait assistance: Min guard Ambulation Distance (Feet): 14 Feet Assistive device: Rolling walker (2 wheeled) Gait Pattern/deviations: Step-through pattern;Decreased stance time - left;Decreased step length - right;Wide base of support;Shuffle Gait velocity: decr Gait velocity interpretation: Below normal speed for age/gender General  Gait Details: cues for step through gt and safety with RW; min guard to steady and manage RW  Stairs            Wheelchair Mobility    Modified Rankin (Stroke Patients Only)       Balance Overall balance assessment: Needs assistance Sitting-balance support: Feet supported;No upper extremity supported Sitting balance-Leahy Scale: Good     Standing balance support: During functional activity;Bilateral upper extremity supported Standing balance-Leahy Scale: Poor Standing balance comment: RW to balance                              Pertinent Vitals/Pain Pain Assessment: 0-10 Pain Score: 2  Pain Location: Lt knee Pain Descriptors / Indicators: Aching;Sore Pain Intervention(s): Monitored during session;Premedicated before session;Repositioned    Home Living Family/patient expects to be discharged to:: Private residence Living Arrangements: Alone Available Help at Discharge: Family;Available 24 hours/day Type of Home: House Home Access: Stairs to enter Entrance Stairs-Rails: Left;Right;Can reach both Entrance Stairs-Number of Steps: 2 Home Layout: One level Home Equipment: Cane - single point Additional Comments: pt has walk in shower with seat    Prior Function Level of Independence: Independent with assistive device(s)         Comments: ambulating with cane     Hand Dominance        Extremity/Trunk Assessment   Upper Extremity Assessment: Defer to OT evaluation           Lower Extremity Assessment: LLE deficits/detail   LLE Deficits / Details: AROM 0 to 80  Cervical / Trunk Assessment: Normal  Communication   Communication: No  difficulties  Cognition Arousal/Alertness: Awake/alert Behavior During Therapy: WFL for tasks assessed/performed Overall Cognitive Status: Within Functional Limits for tasks assessed                      General Comments      Exercises Total Joint Exercises Ankle Circles/Pumps: AROM;Both;10  reps Quad Sets: AROM;Left;10 reps Hip ABduction/ADduction: AAROM;Left;10 reps Long Arc Quad: AAROM;Left;10 reps      Assessment/Plan    PT Assessment Patient needs continued PT services  PT Diagnosis Difficulty walking;Acute pain;Generalized weakness   PT Problem List Decreased strength;Decreased range of motion;Decreased activity tolerance;Decreased balance;Decreased mobility;Decreased knowledge of use of DME;Pain  PT Treatment Interventions DME instruction;Gait training;Stair training;Functional mobility training;Therapeutic activities;Therapeutic exercise;Balance training;Neuromuscular re-education;Patient/family education   PT Goals (Current goals can be found in the Care Plan section) Acute Rehab PT Goals Patient Stated Goal: to go home with my daughter PT Goal Formulation: With patient Time For Goal Achievement: 08/15/14 Potential to Achieve Goals: Good    Frequency 7X/week   Barriers to discharge        Co-evaluation               End of Session Equipment Utilized During Treatment: Gait belt Activity Tolerance: Patient tolerated treatment well Patient left: in chair;with call bell/phone within reach Nurse Communication: Mobility status         Time: [id number] PT Time Calculation (min) (ACUTE ONLY): 27 min   Charges:   PT Evaluation $Initial PT Evaluation Tier I: 1 Procedure PT Treatments $Gait Training: 8-22 mins   PT G CodesDonell Mayer:        Haley Mayer, [place]PT [phone] 08/08/2014, 9:27 AM

## 2014-08-08 NOTE — Progress Notes (Signed)
   08/08/14 0335  Vitals  Temp 97.5 F (36.4 C)  Temp Source Oral  BP (!) 102/59 mmHg  MAP (mmHg) 68  BP Location Right Arm  BP Method Automatic  Patient Position (if appropriate) Lying  Pulse Rate (!) 39  Pulse Rate Source Dinamap  Cardiac Rhythm SB  Resp 16  Oxygen Therapy  SpO2 94 %  O2 Device Room Air  Came back from lunch and was looking over VS and saw that pt had been charted as being bradycardic earlier so this RN retook VS to check for accuracy. Pt was asymptomatic, denies dizziness or chest pain or SOB. The dynamap was reading HR was low.  The pt palpates ar running and irregular HR around 48/min.  Notified charge nurse and together we placed pt on tele monitor to get a more accurate read and the HR is still irregular and brady but not as low as reflected on dynamap.  Took a 12 lead ECG which showed HR in 70's with 1st degree Av block and then took a 2nd one with 60's HR with 2nd degree av block.  Avel Peacerew Perkins PA, was notifed and orders were placed for a 250 bolus, increase in fluids to 100/hr, stat labs including baseline troponin and order for telemetry.  Monitor showing pt in the 40's and 50's but pt remains asymptomatic. Will continue to monitor pt.

## 2014-08-08 NOTE — Progress Notes (Signed)
Physical Therapy Treatment Patient Details Name: Haley Mayer MRN: [id number] DOB: June 04, 1929 Today's Date: 08/08/2014    History of Present Illness pt is a 81 y.o. female s/p Lt TKA.    PT Comments    Pt unable to progress mobility this session due to nausea. Pt performed pivotal steps to bed only with min (A). Will cont to follow per POC. Discussed need to progress mobility for safe D/C home.   Follow Up Recommendations  Home health PT;Supervision/Assistance - 24 hour     Equipment Recommendations  Rolling walker with 5" wheels    Recommendations for Other Services       Precautions / Restrictions Precautions Precautions: Fall;Knee Precaution Comments: reviewed no pillow under knee  Restrictions Weight Bearing Restrictions: Yes LLE Weight Bearing: Weight bearing as tolerated    Mobility  Bed Mobility Overal bed mobility: Needs Assistance Bed Mobility: Sit to Supine       Sit to supine: Min assist   General bed mobility comments: (A) to bring Lt LE into bed; incr time due to pain  Transfers Overall transfer level: Needs assistance Equipment used: Rolling walker (2 wheeled) Transfers: Sit to/from [employer] Sit to Stand: Min assist Stand pivot transfers: Min assist       General transfer comment: pt slightly more unsteady rising to stand from recliner; cues for technique and hand placement; performed pivotal steps only this session due to c/o nausea   Ambulation/Gait             General Gait Details: limited to pivotal and lateral steps only this session due to nausea   Stairs            Wheelchair Mobility    Modified Rankin (Stroke Patients Only)       Balance Overall balance assessment: Needs assistance Sitting-balance support: Feet supported;No upper extremity supported Sitting balance-Leahy Scale: Fair     Standing balance support: During functional activity;Bilateral upper extremity supported Standing  balance-Leahy Scale: Poor Standing balance comment: RW to balance and min guard                     Cognition Arousal/Alertness: Awake/alert Behavior During Therapy: WFL for tasks assessed/performed Overall Cognitive Status: Within Functional Limits for tasks assessed                      Exercises Total Joint Exercises Ankle Circles/Pumps: AROM;Both;10 reps Heel Slides: AAROM;Left;10 reps;Supine;Limitations Heel Slides Limitations: pain Hip ABduction/ADduction: AAROM;Left;10 reps    General Comments General comments (skin integrity, edema, etc.): encouraged pt to incr mobility with nursing as tolerated to incr activity tolerance      Pertinent Vitals/Pain Pain Assessment: 0-10 Pain Score: 9  Pain Location: 9/10 with movement; 0/10 at rest Pain Descriptors / Indicators: Aching;Sore Pain Intervention(s): Monitored during session;Premedicated before session;Repositioned    Home Living Family/patient expects to be discharged to:: Private residence Living Arrangements: Alone Available Help at Discharge: Family;Available 24 hours/day Type of Home: House Home Access: Stairs to enter Entrance Stairs-Rails: Left;Right;Can reach both Home Layout: One level Home Equipment: Cane - single point;Shower seat;Grab bars - toilet;Grab bars - tub/shower;Toilet riser;Hand held shower head;Adaptive equipment      Prior Function Level of Independence: Independent with assistive device(s)      Comments: ambulating with cane   PT Goals (current goals can now be found in the care plan section) Acute Rehab PT Goals Patient Stated Goal: to go home with my daughter PT  Goal Formulation: With patient Time For Goal Achievement: 08/15/14 Potential to Achieve Goals: Good Progress towards PT goals: Progressing toward goals    Frequency  7X/week    PT Plan Current plan remains appropriate    Co-evaluation             End of Session Equipment Utilized During Treatment:  Gait belt Activity Tolerance: Patient tolerated treatment well Patient left: in bed;with call bell/phone within reach;with family/visitor present     Time: [id number] PT Time Calculation (min) (ACUTE ONLY): 17 min  Charges:  $Therapeutic Activity: 8-22 mins                    G CodesDonell Mayer:      Haley Mayer, Haley CarolinaPT  [phone] 08/08/2014, 4:00 PM

## 2014-08-08 NOTE — Progress Notes (Signed)
Received call from floor about patient's pulse rate.  She was noted to be brady on the pulse monitor by the staff and evaluated by nursing.  Her rate was recorded in the 30's by vital rounds and then later checked using dynamap and EKG.  Staff noted the patient to be sleeping and later when questioned denied any symptoms during the evaluation.  EKG performed and was reported that it read out sinus brady with fist degree AV block.  Her pressures had remained stable.  EKG rate was noted at 76 per staff. Order given for TELE.  The AM labs were requested at this time with an additional Troponin to be added.  She was given additional fluids and continued to be monitored.  Instructed Nursing to call Rapid Response if develops any symptoms. Haley Peacerew Perkins, PA-C

## 2014-08-08 NOTE — Progress Notes (Addendum)
   Subjective:  Patient reports pain as mild.  Denies N/V/CP/SOB. Bradycardia o/n to high 30's - asymptomatic, placed on tele. Denies dizzyness.  Objective:   VITALS:   Filed Vitals:   08/08/14 0357 08/08/14 0400 08/08/14 0427 08/08/14 0600  BP: 134/58  120/62 131/52  Pulse:    76  Temp:    97.5 F (36.4 C)  TempSrc:      Resp:  16  16  Height:      Weight:      SpO2:  94%  94%    ABD soft Sensation intact distally Dorsiflexion/Plantar flexion intact Incision: dressing C/D/I Compartment soft 1+ DP pulse with triphasic doppler signal.  Foot warm   Lab Results  Component Value Date   WBC 9.6 08/08/2014   HGB 9.9* 08/08/2014   HCT 29.4* 08/08/2014   MCV 91.0 08/08/2014   PLT 144* 08/08/2014   BMET    Component Value Date/Time   NA 136 08/08/2014 0509   K 3.6 08/08/2014 0509   CL 102 08/08/2014 0509   CO2 25 08/08/2014 0509   GLUCOSE 210* 08/08/2014 0509   BUN 14 08/08/2014 0509   CREATININE 1.08 08/08/2014 0509   CALCIUM 8.4 08/08/2014 0509   GFRNONAA 46* 08/08/2014 0509   GFRAA 54* 08/08/2014 0509     Assessment/Plan: 1 Day Post-Op   Active Problems:   Avascular necrosis of medial femoral condyle  WBAT with walker Advance diet Up with therapy D/C IV fluids Will consult cardiology regarding bradycardia   Garnet KoyanagiSwinteck, Oralee Rapaport James 08/08/2014, 7:06 AM   Samson FredericBrian Mykah Shin, MD Cell [phone]

## 2014-08-08 NOTE — Evaluation (Signed)
Occupational Therapy Evaluation Patient Details Name: Haley Mayer MRN: [id number] DOB: 1930/03/26 Today's Date: 08/08/2014    History of Present Illness pt is a 81 y.o. female s/p Lt TKA.   Clinical Impression   Pt admitted with the above diagnoses and presents with below problem list. Pt will benefit from continued acute OT to address the below listed deficits and maximize independence with BADLs prior to d/c home with family. PTA pt was mod I with ADLs. Pt currently at min guard level for LB ADLs. Ot to continue to follow acutely.     Follow Up Recommendations  Supervision/Assistance - 24 hour    Equipment Recommendations  None recommended by OT    Recommendations for Other Services       Precautions / Restrictions Precautions Precautions: Fall;Knee Precaution Comments: reviewed Restrictions Weight Bearing Restrictions: Yes LLE Weight Bearing: Weight bearing as tolerated      Mobility Bed Mobility               General bed mobility comments: in recliner  Transfers Overall transfer level: Needs assistance Equipment used: Rolling walker (2 wheeled) Transfers: Sit to/from Stand Sit to Stand: Min guard         General transfer comment: cues for technique; close min guard for balance upon initial stand    Balance Overall balance assessment: Needs assistance         Standing balance support: Bilateral upper extremity supported;During functional activity Standing balance-Leahy Scale: Poor Standing balance comment: needs rw for balance                            ADL Overall ADL's : Needs assistance/impaired Eating/Feeding: Set up;Sitting   Grooming: Set up;Sitting;Standing   Upper Body Bathing: Set up;Sitting   Lower Body Bathing: Min guard;With adaptive equipment;Sit to/from stand   Upper Body Dressing : Set up;Sitting   Lower Body Dressing: Min guard;With adaptive equipment;Sit to/from stand   Toilet Transfer: Min  guard;Ambulation;Comfort height toilet;Grab bars;RW   Toileting- ArchitectClothing Manipulation and Hygiene: Min guard;Sit to/from stand;With adaptive equipment   Tub/ Shower Transfer: Min guard;Ambulation;Rolling walker   Functional mobility during ADLs: Min guard;Rolling walker General ADL Comments: Educated pt on techniques and AE for safe completion of ADLs with knee precautions. Pt ambulated to bathroom and completed sit<>transfers from 3n1 and recliner all at min guard level. Cues provided on technique with rw. Also discussed home set up safety.     Vision     Perception     Praxis      Pertinent Vitals/Pain Pain Assessment: 0-10 Pain Score: 5  Pain Location: Lt knee with movement Pain Descriptors / Indicators: Aching;Sore Pain Intervention(s): Monitored during session;Repositioned;Utilized relaxation techniques     Hand Dominance     Extremity/Trunk Assessment Upper Extremity Assessment Upper Extremity Assessment: Overall WFL for tasks assessed   Lower Extremity Assessment Lower Extremity Assessment: Defer to PT evaluation       Communication Communication Communication: No difficulties   Cognition Arousal/Alertness: Awake/alert Behavior During Therapy: WFL for tasks assessed/performed Overall Cognitive Status: Within Functional Limits for tasks assessed                     General Comments       Exercises       Shoulder Instructions      Home Living Family/patient expects to be discharged to:: Private residence Living Arrangements: Alone Available Help at Discharge: Family;Available 24 hours/day  Type of Home: House Home Access: Stairs to enter [employer] of Steps: 2 Entrance Stairs-Rails: Left;Right;Can reach both Home Layout: One level     Bathroom Shower/Tub: Producer, television/film/video: Handicapped height Bathroom Accessibility: Yes   Home Equipment: Cane - single point;Shower seat;Grab bars - toilet;Grab bars -  tub/shower;Toilet riser;Hand held shower head;Adaptive equipment Adaptive Equipment: Reacher        Prior Functioning/Environment Level of Independence: Independent with assistive device(s)        Comments: ambulating with cane    OT Diagnosis: Acute pain   OT Problem List: Impaired balance (sitting and/or standing);Decreased knowledge of use of DME or AE;Decreased knowledge of precautions;Pain   OT Treatment/Interventions: Self-care/ADL training;DME and/or AE instruction;Therapeutic activities;Patient/family education;Balance training    OT Goals(Current goals can be found in the care plan section) Acute Rehab OT Goals Patient Stated Goal: to go home with my daughter OT Goal Formulation: With patient Time For Goal Achievement: 08/15/14 Potential to Achieve Goals: Good ADL Goals Pt Will Perform Lower Body Bathing: with modified independence;with adaptive equipment;sit to/from stand Pt Will Perform Lower Body Dressing: with modified independence;with adaptive equipment;sit to/from stand Pt Will Transfer to Toilet: with modified independence;ambulating;grab bars (elevated toilet seat) Pt Will Perform Toileting - Clothing Manipulation and hygiene: with modified independence;with adaptive equipment;sit to/from stand Pt Will Perform Tub/Shower Transfer: with supervision;ambulating;shower seat;rolling walker  OT Frequency: Min 2X/week   Barriers to D/C:            Co-evaluation              End of Session Equipment Utilized During Treatment: Gait belt;Rolling walker  Activity Tolerance: Patient tolerated treatment well Patient left: in chair;with call bell/phone within reach   Time: 4098-1191 OT Time Calculation (min): 27 min Charges:  OT General Charges $OT Visit: 1 Procedure OT Evaluation $Initial OT Evaluation Tier I: 1 Procedure OT Treatments $Self Care/Home Management : 8-22 mins G-Codes:    Pilar Grammes 08-17-2014, 1:37 PM

## 2014-08-09 DIAGNOSIS — R001 Bradycardia, unspecified: Secondary | ICD-10-CM

## 2014-08-09 LAB — GLUCOSE, CAPILLARY
GLUCOSE-CAPILLARY: 164 mg/dL — AB (ref 70–99)
Glucose-Capillary: 232 mg/dL — ABNORMAL HIGH (ref 70–99)

## 2014-08-09 MED ORDER — ASPIRIN EC 81 MG PO TBEC: 81.0000 mg | DELAYED_RELEASE_TABLET | Freq: Two times a day (BID) | ORAL | Status: AC

## 2014-08-09 MED ORDER — SENNA 8.6 MG PO TABS: 2.0000 | ORAL_TABLET | Freq: Every day | ORAL | Status: DC

## 2014-08-09 MED ORDER — DOCUSATE SODIUM 100 MG PO CAPS: 100.0000 mg | ORAL_CAPSULE | Freq: Two times a day (BID) | ORAL | Status: DC

## 2014-08-09 MED ORDER — HYDROCODONE-ACETAMINOPHEN 5-325 MG PO TABS: 1.0000 | ORAL_TABLET | ORAL | Status: DC | PRN

## 2014-08-09 NOTE — Discharge Instructions (Signed)
Dr. Samson Frederic Total Joint Specialist [facility] [address], [address] [place], [place] [address] [phone]  KNEE REPLACEMENT POSTOPERATIVE DIRECTIONS   Knee Rehabilitation, Guidelines Following Surgery  Results after knee surgery are often greatly improved when you follow the exercise, range of motion and muscle strengthening exercises prescribed by your doctor. Safety measures are also important to protect the knee from further injury. Any time any of these exercises cause you to have increased pain or swelling in your knee joint, decrease the amount until you are comfortable again and slowly increase them. If you have problems or questions, call your caregiver or physical therapist for advice.   HOME CARE INSTRUCTIONS  Remove items at home which could result in a fall. This includes throw rugs or furniture in walking pathways.  Continue medications as instructed at time of discharge. You may have some home medications which will be placed on hold until you complete the course of blood thinner medication.  You may start showering once you are discharged home but do not submerge the incision under water. Just pat the incision dry and apply a dry gauze dressing on daily. Walk with walker as instructed.  You may resume a sexual relationship in one month or when given the OK by your doctor.   Use walker as long as suggested by your caregivers.  Avoid periods of inactivity such as sitting longer than an hour when not asleep. This helps prevent blood clots.  You may put full weight on your legs and walk as much as is comfortable.  You may return to work once you are cleared by your doctor.  Do not drive a car for 6 weeks or until released by you surgeon.   Do not drive while taking narcotics.  Wear the elastic stockings for three weeks following surgery during the day but you may remove then at night. Make sure you keep all of your appointments after your operation  with all of your doctors and caregivers. You should call the office at the above phone number and make an appointment for approximately two weeks after the date of your surgery. Do not remove your surgical dressing. The dressing is waterproof; you may take showers in 3 days, but do not take tub baths or submerge the dressing. Please pick up a stool softener and laxative for home use as long as you are requiring pain medications.  ICE to the affected knee every three hours for 30 minutes at a time and then as needed for pain and swelling.  Continue to use ice on the knee for pain and swelling from surgery. You may notice swelling that will progress down to the foot and ankle.  This is normal after surgery.  Elevate the leg when you are not up walking on it.   It is important for you to complete the blood thinner medication as prescribed by your doctor.  Continue to use the breathing machine which will help keep your temperature down.  It is common for your temperature to cycle up and down following surgery, especially at night when you are not up moving around and exerting yourself.  The breathing machine keeps your lungs expanded and your temperature down.  RANGE OF MOTION AND STRENGTHENING EXERCISES  Rehabilitation of the knee is important following a knee injury or an operation. After just a few days of immobilization, the muscles of the thigh which control the knee become weakened and shrink (atrophy). Knee exercises are designed to build up  the tone and strength of the thigh muscles and to improve knee motion. Often times heat used for twenty to thirty minutes before working out will loosen up your tissues and help with improving the range of motion but do not use heat for the first two weeks following surgery. These exercises can be done on a training (exercise) mat, on the floor, on a table or on a bed. Use what ever works the best and is most comfortable for you Knee exercises include:  Leg Lifts -  While your knee is still immobilized in a splint or cast, you can do straight leg raises. Lift the leg to 60 degrees, hold for 3 sec, and slowly lower the leg. Repeat 10-20 times 2-3 times daily. Perform this exercise against resistance later as your knee gets better.  Quad and Hamstring Sets - Tighten up the muscle on the front of the thigh (Quad) and hold for 5-10 sec. Repeat this 10-20 times hourly. Hamstring sets are done by pushing the foot backward against an object and holding for 5-10 sec. Repeat as with quad sets.  A rehabilitation program following serious knee injuries can speed recovery and prevent re-injury in the future due to weakened muscles. Contact your doctor or a physical therapist for more information on knee rehabilitation.   SKILLED REHAB INSTRUCTIONS: If the patient is transferred to a skilled rehab facility following release from the hospital, a list of the current medications will be sent to the facility for the patient to continue.  When discharged from the skilled rehab facility, please have the facility set up the patient's Home Health Physical Therapy prior to being released. Also, the skilled facility will be responsible for providing the patient with their medications at time of release from the facility to include their pain medication, the muscle relaxants, and their blood thinner medication. If the patient is still at the rehab facility at time of the two week follow up appointment, the skilled rehab facility will also need to assist the patient in arranging follow up appointment in our office and any transportation needs.  MAKE SURE YOU:  Understand these instructions.  Will watch your condition.  Will get help right away if you are not doing well or get worse.    Pick up stool softner and laxative for home use following surgery while on pain medications. Do NOT remove your dressing. You may shower.  Do not take tub baths or submerge incision under water. May  shower starting three days after surgery. Please use a clean towel to pat the incision dry following showers. Continue to use ice for pain and swelling after surgery. Do not use any lotions or creams on the incision until instructed by your surgeon.

## 2014-08-09 NOTE — Progress Notes (Signed)
Occupational Therapy Treatment Patient Details Name: Haley Mayer MRN: [id number] DOB: [birth year] Today's Date: 08/09/2014    History of present illness pt is a 81 y.o. female s/p Lt TKA.   OT comments  Pt. Progressing well with acute OT goals and is very eager to d/c home.  Currently at S level for most ADLS and will have 16/3 A from dtr. And son-in-law as needed.  Will alert OTR/L clear for d/c from OT.    Follow Up Recommendations  Supervision/Assistance - 24 hour    Equipment Recommendations  None recommended by OT    Recommendations for Other Services      Precautions / Restrictions Precautions Precautions: Fall;Knee Restrictions LLE Weight Bearing: Weight bearing as tolerated       Mobility Bed Mobility Overal bed mobility: Modified Independent       Supine to sit: Modified independent (Device/Increase time)     General bed mobility comments: no rails, but HOB slightly elevated secondary to pt. has bed with these functions at home  Transfers Overall transfer level: Needs assistance Equipment used: Rolling walker (2 wheeled) Transfers: Sit to/from [facility] Sit to Stand: Supervision Stand pivot transfers: Supervision            Balance                                   ADL Overall ADL's : Needs assistance/impaired                     Lower Body Dressing: Modified independent;Sit to/from stand;Sitting/lateral leans Lower Body Dressing Details (indicate cue type and reason): able to lean forward and reach B LES to don/doff socks Toilet Transfer: Supervision/safety;RW Toilet Transfer Details (indicate cue type and reason): S simulated in room while amb. from eob around bed to Porter and Hygiene: Supervision/safety;Sit to/from stand       Functional mobility during ADLs: Supervision/safety;Rolling walker General ADL Comments: pt. doing great today.  does not require use of A/E  to complete LB ADLS, but will have dtr and son-in-law available 24/7 as needed      Vision                     Perception     Praxis      Cognition   Behavior During Therapy: [facility] for tasks assessed/performed Overall Cognitive Status: Within Functional Limits for tasks assessed                       Extremity/Trunk Assessment               Exercises     Shoulder Instructions       General Comments      Pertinent Vitals/ Pain       Pain Assessment: No/denies pain Pain Score: 0-No pain  Home Living                                          Prior Functioning/Environment              Frequency Min 2X/week     Progress Toward Goals  OT Goals(current goals can now be found in the care plan section)  Progress towards OT goals: Goals met/education completed, patient discharged from  OT     Plan Discharge plan remains appropriate    Co-evaluation                 End of Session Equipment Utilized During Treatment: Rolling walker   Activity Tolerance Patient tolerated treatment well   Patient Left in chair;with call bell/phone within reach   Nurse Communication          Time: 0677-0340 OT Time Calculation (min): 16 min  Charges: OT General Charges $OT Visit: 1 Procedure OT Treatments $Self Care/Home Management : 8-22 mins  Janice Coffin, COTA/L 08/09/2014, 7:58 AM

## 2014-08-09 NOTE — Progress Notes (Addendum)
Patient seen briefly, d/w Dr. Royann Shiversroitoru. Tele shows occasional episodes of Type I second degree HB, rare blocked PAC but generally not usually bradycardic. She had one episode of bradycardia into the upper 30s overnight, still Wenckebach. She has been asymptomatic with all episodes. No history of dizziness or syncope. Timolol was resumed per primary team on discharge med list. We feel this is reasonable given the asymptomatic nature of her arrhythmia, and we do not want to acutely exacerbate her glaucoma. However, I have asked her and her family to call her eye doctor to see if there is something else they can switch her to instead of timolol. [facility] with Dr. Mariah MillingGollan 08/29/14 - 3pm.  I have written these instructions manually on her AVS. Patient instructed to notify our office/seek medical attention if she does ever develop dizziness or has episode of syncope. Dayna Dunn PA-C

## 2014-08-09 NOTE — Progress Notes (Signed)
Physical Therapy Treatment Patient Details Name: Haley Mayer MRN: [id number] DOB: January 15, 1930 Today's Date: 08/09/2014    History of Present Illness pt is a 81 y.o. female s/p Lt TKA.    PT Comments    Pt at supervision level for all mobility today. Reviewed HEP and stair management. Pt safe from PT standpoint to D/C at this time with family.   Follow Up Recommendations  Home health PT;Supervision/Assistance - 24 hour     Equipment Recommendations  Rolling walker with 5" wheels    Recommendations for Other Services       Precautions / Restrictions Precautions Precautions: Knee Precaution Comments: reviewed knee positioning Restrictions Weight Bearing Restrictions: Yes LLE Weight Bearing: Weight bearing as tolerated    Mobility  Bed Mobility Overal bed mobility: Modified Independent       Supine to sit: Modified independent (Device/Increase time)     General bed mobility comments: up in chair  Transfers Overall transfer level: Needs assistance Equipment used: Rolling walker (2 wheeled) Transfers: Sit to/from Stand Sit to Stand: Supervision Stand pivot transfers: Supervision       General transfer comment: supervision for min cues for hand placement  Ambulation/Gait Ambulation/Gait assistance: Supervision Ambulation Distance (Feet): 100 Feet Assistive device: Rolling walker (2 wheeled) Gait Pattern/deviations: Step-through pattern;Decreased stride length;Wide base of support Gait velocity: decr Gait velocity interpretation: Below normal speed for age/gender General Gait Details: pt ambulating with step through gt; decr speed due to soreness; no LOB noted   Stairs Stairs: Yes Stairs assistance: Supervision Stair Management: Two rails;Step to pattern;Forwards Number of Stairs: 2 General stair comments: cues for technique  Wheelchair Mobility    Modified Rankin (Stroke Patients Only)       Balance Overall balance assessment: No apparent  balance deficits (not formally assessed)                                  Cognition Arousal/Alertness: Awake/alert Behavior During Therapy: WFL for tasks assessed/performed Overall Cognitive Status: Within Functional Limits for tasks assessed                      Exercises Total Joint Exercises Ankle Circles/Pumps: AROM;Both;10 reps Quad Sets: AROM;Left;10 reps Hip ABduction/ADduction: Left;10 reps;AROM Straight Leg Raises: AROM;Left;5 reps Long Arc Quad: Left;10 reps;AROM Knee Flexion: AROM;Left;10 reps Goniometric ROM: 0 to 90    General Comments General comments (skin integrity, edema, etc.): pt given HEP and reviewed      Pertinent Vitals/Pain Pain Assessment: 0-10 Pain Score: 1  Pain Location: Lt knee Pain Descriptors / Indicators: Sore Pain Intervention(s): Monitored during session;Premedicated before session;Repositioned    Home Living                      Prior Function            PT Goals (current goals can now be found in the care plan section) Acute Rehab PT Goals Patient Stated Goal: go home today PT Goal Formulation: With patient Time For Goal Achievement: 08/15/14 Potential to Achieve Goals: Good Progress towards PT goals: Progressing toward goals    Frequency  7X/week    PT Plan Current plan remains appropriate    Co-evaluation             End of Session   Activity Tolerance: Patient tolerated treatment well Patient left: in chair;with call bell/phone within reach  Time: [id number] PT Time Calculation (min) (ACUTE ONLY): 24 min  Charges:  $Gait Training: 8-22 mins $Therapeutic Exercise: 8-22 mins                    G Codes:      Donell SievertWest, Mikell Camp N, [place]  [phone] 08/09/2014, 8:58 AM

## 2014-08-09 NOTE — Progress Notes (Signed)
   Subjective:  Patient reports pain as mild.  Denies N/V/CP/SOB. No events. Cleared therapy.  Objective:   VITALS:   Filed Vitals:   08/08/14 1957 08/09/14 0000 08/09/14 0353 08/09/14 0555  BP: 146/46   145/48  Pulse: 80   78  Temp: 98 F (36.7 C)   97.5 F (36.4 C)  TempSrc:      Resp: 18 18 18 18   Height:      Weight:      SpO2: 98% 98% 98% 98%    ABD soft Sensation intact distally Dorsiflexion/Plantar flexion intact Incision: dressing C/D/I Compartment soft 1+ DP pulse with triphasic doppler signal.  Foot warm   Lab Results  Component Value Date   WBC 9.6 08/08/2014   HGB 9.9* 08/08/2014   HCT 29.4* 08/08/2014   MCV 91.0 08/08/2014   PLT 144* 08/08/2014   BMET    Component Value Date/Time   NA 136 08/08/2014 0509   K 3.6 08/08/2014 0509   CL 102 08/08/2014 0509   CO2 25 08/08/2014 0509   GLUCOSE 210* 08/08/2014 0509   BUN 14 08/08/2014 0509   CREATININE 1.08 08/08/2014 0509   CALCIUM 8.4 08/08/2014 0509   GFRNONAA 46* 08/08/2014 0509   GFRAA 54* 08/08/2014 0509     Assessment/Plan: 2 Days Post-Op   Active Problems:   Avascular necrosis of medial femoral condyle   Bradycardia   LBBB (left bundle branch block)   Second degree type I atrioventricular block  WBAT with walker Discharge home with home health   Blakeley Scheier, Cloyde ReamsBrian James 08/09/2014, 9:15 AM   Samson FredericBrian Fran Neiswonger, MD Cell [phone]

## 2014-08-09 NOTE — Discharge Summary (Signed)
Physician Discharge Summary  Patient ID: Haley Mayer MRN: [id number] DOB/AGE: 79-Sep-1931 79 y.o.  Admit date: 08/07/2014 Discharge date: 08/09/2014  Admission Diagnoses:  Avascular necrosis of medial femoral condyle  Discharge Diagnoses:  Principal Problem:   Avascular necrosis of medial femoral condyle Active Problems:   Bradycardia   LBBB (left bundle branch block)   Second degree type I atrioventricular block   Past Medical History  Diagnosis Date  . Hypertension   . Hyperlipidemia   . Chronic kidney disease     function no 100% per patient   . Arthritis   . Diabetes mellitus without complication     Type II  . Glaucoma     Surgeries: Procedure(s): LEFT MEDIAL PARTIAL KNEE REPLACEMENT  on 08/07/2014   Consultants (if any): Treatment Team:  Rounding Lbcardiology, MD  Discharged Condition: Improved  Hospital Course: Haley Mayer is an 81 y.o. female who was admitted 08/07/2014 with a diagnosis of Avascular necrosis of medial femoral condyle and went to the operating room on 08/07/2014 and underwent the above named procedures.    She was given perioperative antibiotics:      Anti-infectives    Start     Dose/Rate Route Frequency Ordered Stop   08/07/14 1900  ceFAZolin (ANCEF) IVPB 2 g/50 mL premix     2 g 100 mL/hr over 30 Minutes Intravenous Every 6 hours 08/07/14 1835 08/08/14 0030   08/07/14 0600  ceFAZolin (ANCEF) IVPB 2 g/50 mL premix     2 g 100 mL/hr over 30 Minutes Intravenous On call to O.R. 08/06/14 1419 08/07/14 1330   08/06/14 1153  ceFAZolin (ANCEF) IVPB 2 g/50 mL premix  Status:  Discontinued     2 g 100 mL/hr over 30 Minutes Intravenous On call to O.R. 08/06/14 1153 08/06/14 1153    .  She was given sequential compression devices, early ambulation, and lovenox / home on ASA for DVT prophylaxis.  She had bradycardia to the 30s on the night of POD#0 without symptoms. Cardiology evaluated her, and she had no further episodes.  She benefited  maximally from the hospital stay and there were no complications.    Recent vital signs:  Filed Vitals:   08/09/14 0555  BP: 145/48  Pulse: 78  Temp: 97.5 F (36.4 C)  Resp: 18    Recent laboratory studies:  Lab Results  Component Value Date   HGB 9.9* 08/08/2014   HGB 13.0 08/07/2014   HGB 13.4 07/09/2014   Lab Results  Component Value Date   WBC 9.6 08/08/2014   PLT 144* 08/08/2014   Lab Results  Component Value Date   INR 1.05 08/07/2014   Lab Results  Component Value Date   NA 136 08/08/2014   K 3.6 08/08/2014   CL 102 08/08/2014   CO2 25 08/08/2014   BUN 14 08/08/2014   CREATININE 1.08 08/08/2014   GLUCOSE 210* 08/08/2014    Discharge Medications:     Medication List    STOP taking these medications        acetaminophen 500 MG tablet  Commonly known as:  TYLENOL      TAKE these medications        amLODipine 10 MG tablet  Commonly known as:  NORVASC  Take 10 mg by mouth every morning.     aspirin EC 81 MG tablet  Take 1 tablet (81 mg total) by mouth 2 (two) times daily after a meal.     CALCIUM 1200 PO  Take 1 tablet by mouth every morning.     cholecalciferol 1000 UNITS tablet  Commonly known as:  VITAMIN D  Take 1,000 Units by mouth every morning.     diphenhydrAMINE 25 MG tablet  Commonly known as:  BENADRYL  Take 50 mg by mouth every 6 (six) hours as needed for itching or allergies.     docusate sodium 100 MG capsule  Commonly known as:  COLACE  Take 1 capsule (100 mg total) by mouth 2 (two) times daily.     glimepiride 4 MG tablet  Commonly known as:  AMARYL  Take 4 mg by mouth 2 (two) times daily.     HYDROcodone-acetaminophen 5-325 MG per tablet  Commonly known as:  NORCO/VICODIN  Take 1-2 tablets by mouth every 4 (four) hours as needed (breakthrough pain).     magnesium oxide 400 MG tablet  Commonly known as:  MAG-OX  Take 400 mg by mouth 2 (two) times daily.     multivitamin-iron-minerals-folic acid chewable tablet   Chew 1 tablet by mouth every morning.     senna 8.6 MG Tabs tablet  Commonly known as:  SENOKOT  Take 2 tablets (17.2 mg total) by mouth at bedtime.     simvastatin 10 MG tablet  Commonly known as:  ZOCOR  Take 10 mg by mouth at bedtime.     timolol 0.5 % ophthalmic solution  Commonly known as:  TIMOPTIC  Place 1 drop into both eyes every evening.     valsartan-hydrochlorothiazide 320-25 MG per tablet  Commonly known as:  DIOVAN-HCT  Take 1 tablet by mouth every morning.        Diagnostic Studies: Dg Knee Left Port  08/07/2014   CLINICAL DATA:  Medial compartment arthroplasty.  EXAM: PORTABLE LEFT KNEE - 1-2 VIEW  COMPARISON:  None.  FINDINGS: New uncomplicated LEFT medial compartment arthroplasty. Alignment is anatomic. Expected postsurgical changes in the soft tissues.  IMPRESSION: New uncomplicated LEFT medial compartment arthroplasty.   Electronically Signed   By: Andreas Newport M.D.   On: 08/07/2014 16:51    Disposition: Final discharge disposition not confirmed  Discharge Instructions    Call MD / Call 911    Complete by:  As directed   If you experience chest pain or shortness of breath, CALL 911 and be transported to the hospital emergency room.  If you develope a fever above 101 F, pus (white drainage) or increased drainage or redness at the wound, or calf pain, call your surgeon's office.     Constipation Prevention    Complete by:  As directed   Drink plenty of fluids.  Prune juice may be helpful.  You may use a stool softener, such as Colace (over the counter) 100 mg twice a day.  Use MiraLax (over the counter) for constipation as needed.     Diet - low sodium heart healthy    Complete by:  As directed      Increase activity slowly as tolerated    Complete by:  As directed            Follow-up Information    Follow up with Derec Mozingo, Cloyde Reams, MD In 2 weeks.   Specialty:  Orthopedic Surgery   Why:  For wound re-check   Contact information:   [address] [place] [address] [phone]        Signed: Garnet Koyanagi 08/09/2014, 9:44 AM

## 2014-08-09 NOTE — Progress Notes (Signed)
Haley MaplesAlice R Mayer to be D/C'd Home per MD order. Discussed with the patient and all questions fully answered.    Medication List    STOP taking these medications        acetaminophen 500 MG tablet  Commonly known as:  TYLENOL      TAKE these medications        amLODipine 10 MG tablet  Commonly known as:  NORVASC  Take 10 mg by mouth every morning.     aspirin EC 81 MG tablet  Take 1 tablet (81 mg total) by mouth 2 (two) times daily after a meal.     CALCIUM 1200 PO  Take 1 tablet by mouth every morning.     cholecalciferol 1000 UNITS tablet  Commonly known as:  VITAMIN D  Take 1,000 Units by mouth every morning.     diphenhydrAMINE 25 MG tablet  Commonly known as:  BENADRYL  Take 50 mg by mouth every 6 (six) hours as needed for itching or allergies.     docusate sodium 100 MG capsule  Commonly known as:  COLACE  Take 1 capsule (100 mg total) by mouth 2 (two) times daily.     glimepiride 4 MG tablet  Commonly known as:  AMARYL  Take 4 mg by mouth 2 (two) times daily.     HYDROcodone-acetaminophen 5-325 MG per tablet  Commonly known as:  NORCO/VICODIN  Take 1-2 tablets by mouth every 4 (four) hours as needed (breakthrough pain).     magnesium oxide 400 MG tablet  Commonly known as:  MAG-OX  Take 400 mg by mouth 2 (two) times daily.     multivitamin-iron-minerals-folic acid chewable tablet  Chew 1 tablet by mouth every morning.     senna 8.6 MG Tabs tablet  Commonly known as:  SENOKOT  Take 2 tablets (17.2 mg total) by mouth at bedtime.     simvastatin 10 MG tablet  Commonly known as:  ZOCOR  Take 10 mg by mouth at bedtime.     timolol 0.5 % ophthalmic solution  Commonly known as:  TIMOPTIC  Place 1 drop into both eyes every evening.     valsartan-hydrochlorothiazide 320-25 MG per tablet  Commonly known as:  DIOVAN-HCT  Take 1 tablet by mouth every morning.        VVS, Skin clean, dry and intact without evidence of skin break down, no evidence of skin  tears noted.  IV catheter discontinued intact. Site without signs and symptoms of complications. Dressing and pressure applied.  An After Visit Summary was printed and given to the patient.  Patient escorted via WC, and D/C home via private auto.  Haley LevinsJacobs, Haley Mayer N  08/09/2014 3:11 PM

## 2014-08-09 NOTE — Care Management Note (Signed)
CARE MANAGEMENT NOTE 08/09/2014  Patient:  Haley Mayer,Haley Mayer   Account Number:  [id number]  Date Initiated:  08/09/2014  Documentation initiated by:  Vance PeperBRADY,Enrica Corliss  Subjective/Objective Assessment:   81 yr old female admitted with left knee stress fracture. Patient underwent left partial knee arthroplasty.     Action/Plan:   Case manager spoke with patient concerning home health/DME needs. Choice offered. [facility]. Patient has 3in1. States dtg Massie BougieBelinda will be staying with her after discharge.   Anticipated DC Date:  08/09/2014   Anticipated DC Plan:  HOME W HOME HEALTH SERVICES      DC Planning Services  CM consult      PAC Choice  DURABLE MEDICAL EQUIPMENT  HOME HEALTH   Choice offered to / List presented to:  C-1 Patient   DME arranged  [facility]      DME agency  [facility]     HH arranged  HH-2 PT      [facility]   Status of service:  Completed, signed off Medicare Important Message given?   (If response is "NO", the following Medicare IM given date fields will be blank) Date Medicare IM given:   Medicare IM given by:   Date Additional Medicare IM given:   Additional Medicare IM given by:    Discharge Disposition:  HOME W HOME HEALTH SERVICES  Per UR Regulation:  Reviewed for med. necessity/level of care/duration of stay

## 2014-08-12 ENCOUNTER — Encounter (HOSPITAL_COMMUNITY): Payer: Self-pay | Admitting: Orthopedic Surgery

## 2014-08-22 NOTE — Addendum Note (Signed)
Addendum  created 08/22/14 1431 by Marcene Duosobert Fawnda Vitullo, MD   Modules edited: Anesthesia Attestations

## 2014-08-29 ENCOUNTER — Encounter: Payer: Self-pay | Admitting: Cardiovascular Disease

## 2014-08-29 ENCOUNTER — Ambulatory Visit (INDEPENDENT_AMBULATORY_CARE_PROVIDER_SITE_OTHER): Payer: Medicare Other | Admitting: Cardiovascular Disease

## 2014-08-29 VITALS — BP 132/60 | HR 85 | Ht 61.0 in | Wt 210.0 lb

## 2014-08-29 DIAGNOSIS — R001 Bradycardia, unspecified: Secondary | ICD-10-CM | POA: Diagnosis not present

## 2014-08-29 DIAGNOSIS — I447 Left bundle-branch block, unspecified: Secondary | ICD-10-CM

## 2014-08-29 DIAGNOSIS — R9431 Abnormal electrocardiogram [ECG] [EKG]: Secondary | ICD-10-CM

## 2014-08-29 DIAGNOSIS — E119 Type 2 diabetes mellitus without complications: Secondary | ICD-10-CM | POA: Diagnosis not present

## 2014-08-29 DIAGNOSIS — E1169 Type 2 diabetes mellitus with other specified complication: Secondary | ICD-10-CM | POA: Insufficient documentation

## 2014-08-29 DIAGNOSIS — I10 Essential (primary) hypertension: Secondary | ICD-10-CM

## 2014-08-29 DIAGNOSIS — R600 Localized edema: Secondary | ICD-10-CM | POA: Insufficient documentation

## 2014-08-29 DIAGNOSIS — E669 Obesity, unspecified: Secondary | ICD-10-CM

## 2014-08-29 NOTE — Assessment & Plan Note (Signed)
Likely venous insufficiency, unable to exclude mild lymphedema. Not secondary to CHF

## 2014-08-29 NOTE — Patient Instructions (Signed)
You are doing well. No medication changes were made.  Please call us if you have new issues that need to be addressed before your next appt.  Your physician wants you to follow-up in: 12 months.  You will receive a reminder letter in the mail two months in advance. If you don't receive a letter, please call our office to schedule the follow-up appointment. 

## 2014-08-29 NOTE — Assessment & Plan Note (Signed)
Asymptomatic bradycardia noted while on telemetry in the hospital. Heart rate appears adequate on prior EKGs in [place], March, April. She is asymptomatic. Timolol eyedrops have been stopped. Will monitor for now No further testing needed

## 2014-08-29 NOTE — Assessment & Plan Note (Signed)
We have encouraged continued exercise, careful diet management in an effort to lose weight. Managed by primary care 

## 2014-08-29 NOTE — Progress Notes (Signed)
Patient ID: Haley Mayer, female    DOB: 19-May-1929, 81 y.o.   MRN: [id number]  HPI Comments: Ms. Haley Mayer is a very pleasant 81 year old woman with history of left bundle branch block, previous echocardiogram and stress test that were essentially normal in March 2016 as part of a preoperative evaluation prior to knee surgery.  She presents to establish care in the [place] office for bradycardia while in the hospital.  Patient reports that following the surgery she was noted to have bradycardia  Notes from the hospital indicates that the Tele showed occasional episodes of Type I second degree HB, rare blocked PAC.  She had one episode of bradycardia into the upper 30s overnight, still Wenckebach.  She was been asymptomatic with all episodes.  No history of dizziness or syncope.  She reports that she recently saw her ophthalmologist and her eyedrops were changed to Lumigan from timolol. She has been participating in her PT for her knee, surgery 08/07/2014. Otherwise she feels fine with no complaints  EKG on today's visit shows normal sinus rhythm with rate 85 bpm, left bundle branch block Prior EKGs from February and March 2016 show similar findings, rate 67 and 76  Echocardiogram 07/25/2014 showing normal LV function, mild to moderate TR, MR, moderate LVH. Study done at outside facility Stress test, Myoview showing no ischemia. Study done at outside facility    Allergies  Allergen Reactions  . Aspirin Nausea And Vomiting    REGULAR STRENGTH= Also upset stomach   . Iohexol Nausea And Vomiting  . Loperamide Hives  . Other Other (See Comments)    Tomatoes--acid  . Primidone Other (See Comments)    unknown  . Vasotec [Enalapril]     unknown    Outpatient Encounter Prescriptions as of 08/29/2014  Medication Sig  . amLODipine (NORVASC) 10 MG tablet Take 10 mg by mouth every morning.   Marland Kitchen. aspirin EC 81 MG tablet Take 1 tablet (81 mg total) by mouth 2 (two) times daily after a meal.   . bimatoprost (LUMIGAN) 0.01 % SOLN Place 1 drop into both eyes at bedtime.  . Calcium Carbonate-Vit D-Min (CALCIUM 1200 PO) Take 1 tablet by mouth every morning.  . cholecalciferol (VITAMIN D) 1000 UNITS tablet Take 1,000 Units by mouth every morning.   . diphenhydrAMINE (BENADRYL) 25 MG tablet Take 50 mg by mouth every 6 (six) hours as needed for itching or allergies.  Marland Kitchen. docusate sodium (COLACE) 100 MG capsule Take 1 capsule (100 mg total) by mouth 2 (two) times daily.  Marland Kitchen. glimepiride (AMARYL) 4 MG tablet Take 4 mg by mouth 2 (two) times daily.   Marland Kitchen. HYDROcodone-acetaminophen (NORCO/VICODIN) 5-325 MG per tablet Take 1-2 tablets by mouth every 4 (four) hours as needed (breakthrough pain).  . magnesium oxide (MAG-OX) 400 MG tablet Take 400 mg by mouth 2 (two) times daily.   . multivitamin-iron-minerals-folic acid (CENTRUM) chewable tablet Chew 1 tablet by mouth every morning.   . senna (SENOKOT) 8.6 MG TABS tablet Take 2 tablets (17.2 mg total) by mouth at bedtime.  . simvastatin (ZOCOR) 10 MG tablet Take 10 mg by mouth at bedtime.   . valsartan-hydrochlorothiazide (DIOVAN-HCT) 320-25 MG per tablet Take 1 tablet by mouth every morning.   . [DISCONTINUED] timolol (TIMOPTIC) 0.5 % ophthalmic solution Place 1 drop into both eyes every evening.     Past Medical History  Diagnosis Date  . Hypertension   . Hyperlipidemia   . Chronic kidney disease     function no  100% per patient   . Arthritis   . Diabetes mellitus without complication     Type II  . Glaucoma     Past Surgical History  Procedure Laterality Date  . Tonsillectomy    . Cholecystectomy    . Appendectomy    . Abdominal hysterectomy    . Partial knee arthroplasty Left 08/07/2014  . Partial knee arthroplasty Left 08/07/2014    Procedure: LEFT MEDIAL PARTIAL KNEE REPLACEMENT ;  Surgeon: Samson Frederic, MD;  Location: [facility] OR;  Service: Orthopedics;  Laterality: Left;    Social History  reports that she has never smoked. She has  never used smokeless tobacco. She reports that she does not drink alcohol or use illicit drugs.  Family History Family history is unknown by patient.   Review of Systems  Constitutional: Negative.   Respiratory: Negative.   Cardiovascular: Negative.   Gastrointestinal: Negative.   Musculoskeletal: Positive for arthralgias.  Skin: Negative.   Neurological: Negative.   Hematological: Negative.   Psychiatric/Behavioral: Negative.     BP 132/60 mmHg  Pulse 85  Ht  (1.549 m)  Wt 210 lb (95.255 kg)  BMI 39.70 kg/m2  Physical Exam  Constitutional: She is oriented to person, place, and time. She appears well-developed and well-nourished.  HENT:  Head: Normocephalic.  Nose: Nose normal.  Mouth/Throat: Oropharynx is clear and moist.  Eyes: Conjunctivae are normal. Pupils are equal, round, and reactive to light.  Neck: Normal range of motion. Neck supple. No JVD present.  Cardiovascular: Normal rate, regular rhythm, S1 normal, S2 normal, normal heart sounds and intact distal pulses.  Exam reveals no gallop and no friction rub.   No murmur heard. Pulmonary/Chest: Effort normal and breath sounds normal. No respiratory distress. She has no wheezes. She has no rales. She exhibits no tenderness.  Abdominal: Soft. Bowel sounds are normal. She exhibits no distension. There is no tenderness.  Musculoskeletal: Normal range of motion. She exhibits no edema or tenderness.  Lymphadenopathy:    She has no cervical adenopathy.  Neurological: She is alert and oriented to person, place, and time. Coordination normal.  Skin: Skin is warm and dry. No rash noted. No erythema.  Psychiatric: She has a normal mood and affect. Her behavior is normal. Judgment and thought content normal.    Assessment and Plan  Nursing note and vitals reviewed.

## 2014-08-29 NOTE — Assessment & Plan Note (Signed)
Left bundle branch block with normal echocardiogram and stress test March 2016 No further testing needed

## 2014-08-29 NOTE — Assessment & Plan Note (Signed)
Blood pressure is well controlled on today's visit. No changes made to the medications. 

## 2014-09-24 ENCOUNTER — Ambulatory Visit (INDEPENDENT_AMBULATORY_CARE_PROVIDER_SITE_OTHER): Payer: Medicare Other | Admitting: Podiatry

## 2014-09-24 DIAGNOSIS — M79676 Pain in unspecified toe(s): Secondary | ICD-10-CM

## 2014-09-24 DIAGNOSIS — B351 Tinea unguium: Secondary | ICD-10-CM

## 2014-09-24 DIAGNOSIS — E119 Type 2 diabetes mellitus without complications: Secondary | ICD-10-CM

## 2014-09-24 DIAGNOSIS — E669 Obesity, unspecified: Secondary | ICD-10-CM

## 2014-09-24 DIAGNOSIS — E1169 Type 2 diabetes mellitus with other specified complication: Secondary | ICD-10-CM

## 2014-09-24 NOTE — Progress Notes (Signed)
°  Subjective: 81 y.o.-year-old female returns the office today for painful, elongated, thickened toenails which she is unable to trim herself. Denies any redness or drainage around the nails. Denies any acute changes since last appointment and no new complaints today. Denies any systemic complaints such as fevers, chills, nausea, vomiting.   Objective: AAO 3, NAD DP/PT pulses palpable, CRT less than 3 seconds Protective sensation intact with Simms Weinstein monofilament, Achilles tendon reflex intact.  Nails hypertrophic, dystrophic, elongated, brittle, discolored 10. There is tenderness overlying the nails 1-5 bilaterally. There is no surrounding erythema or drainage along the nail sites. No open lesions or pre-ulcerative lesions are identified. No other areas of tenderness bilateral lower extremities. No overlying edema, erythema, increased warmth. No pain with calf compression, swelling, warmth, erythema.  Assessment: Patient presents with symptomatic onychomycosis  Plan: -Treatment options including alternatives, risks, complications were discussed -Nails sharply debrided 10 without complication/bleeding. -Discussed daily foot inspection. If there are any changes, to call the office immediately.  -Follow-up in 3 months or sooner if any problems are to arise. In the meantime, encouraged to call the office with any questions, concerns, changes symptoms.

## 2014-11-06 ENCOUNTER — Other Ambulatory Visit: Payer: Self-pay | Admitting: Internal Medicine

## 2014-11-06 DIAGNOSIS — Z1231 Encounter for screening mammogram for malignant neoplasm of breast: Secondary | ICD-10-CM

## 2014-12-25 ENCOUNTER — Encounter: Payer: Self-pay | Admitting: Podiatry

## 2014-12-25 ENCOUNTER — Ambulatory Visit (INDEPENDENT_AMBULATORY_CARE_PROVIDER_SITE_OTHER): Payer: Medicare Other | Admitting: Podiatry

## 2014-12-25 DIAGNOSIS — M79676 Pain in unspecified toe(s): Secondary | ICD-10-CM

## 2014-12-25 DIAGNOSIS — M79673 Pain in unspecified foot: Secondary | ICD-10-CM | POA: Diagnosis not present

## 2014-12-25 DIAGNOSIS — B351 Tinea unguium: Secondary | ICD-10-CM

## 2014-12-25 NOTE — Progress Notes (Signed)
Patient presents to the office today with a chief complaint of painful elongated toenails.  Objective: Pulses are palpable bilateral. Nails are thick yellow dystrophic clinically mycotic and painful palpation.  Assessment: Pain in limb secondary to onychomycosis 1 through 5 bilateral.  Plan: Debridement of nails 1 through 5 bilateral covered service secondary to pain.  

## 2015-01-06 ENCOUNTER — Ambulatory Visit
Admission: RE | Admit: 2015-01-06 | Discharge: 2015-01-06 | Disposition: A | Discharge status: Home / Self Care | Payer: Medicare Other | Source: Ambulatory Visit | Attending: Internal Medicine | Admitting: Internal Medicine

## 2015-01-06 DIAGNOSIS — Z1231 Encounter for screening mammogram for malignant neoplasm of breast: Secondary | ICD-10-CM | POA: Diagnosis not present

## 2015-01-07 DIAGNOSIS — I10 Essential (primary) hypertension: Secondary | ICD-10-CM | POA: Insufficient documentation

## 2015-03-27 ENCOUNTER — Ambulatory Visit: Payer: Medicare Other

## 2015-03-27 ENCOUNTER — Ambulatory Visit (INDEPENDENT_AMBULATORY_CARE_PROVIDER_SITE_OTHER): Payer: Medicare Other | Admitting: Podiatry

## 2015-03-27 DIAGNOSIS — M79676 Pain in unspecified toe(s): Secondary | ICD-10-CM

## 2015-03-27 DIAGNOSIS — B351 Tinea unguium: Secondary | ICD-10-CM | POA: Diagnosis not present

## 2015-03-27 NOTE — Progress Notes (Signed)
Subjective: 81 y.o. returns the office today for painful, elongated, thickened toenails which she is unable to trim herself. Denies any redness or drainage around the nails. Denies any acute changes since last appointment and no new complaints today. Denies any systemic complaints such as fevers, chills, nausea, vomiting.   Objective: AAO 3, NAD DP/PT pulses palpable, CRT less than 3 seconds Nails hypertrophic, dystrophic, elongated, brittle, discolored 10. There is tenderness overlying the nails 1-5 bilaterally. There is no surrounding erythema or drainage along the nail sites. No open lesions or pre-ulcerative lesions are identified. No other areas of tenderness bilateral lower extremities. No overlying edema, erythema, increased warmth. No pain with calf compression, swelling, warmth, erythema.  Assessment: Patient presents with symptomatic onychomycosis  Plan: -Treatment options including alternatives, risks, complications were discussed -Nails sharply debrided 10 without complication/bleeding. -Discussed daily foot inspection. If there are any changes, to call the office immediately.  -Follow-up in 3 months or sooner if any problems are to arise. In the meantime, encouraged to call the office with any questions, concerns, changes symptoms.  Ovid CurdMatthew Wagoner, DPM

## 2015-05-12 DIAGNOSIS — Z6841 Body Mass Index (BMI) 40.0 and over, adult: Secondary | ICD-10-CM | POA: Insufficient documentation

## 2015-06-27 ENCOUNTER — Ambulatory Visit: Payer: Medicare Other | Admitting: Sports Medicine

## 2015-06-27 ENCOUNTER — Encounter: Payer: Self-pay | Admitting: Sports Medicine

## 2015-06-27 ENCOUNTER — Ambulatory Visit (INDEPENDENT_AMBULATORY_CARE_PROVIDER_SITE_OTHER): Payer: Medicare Other | Admitting: Sports Medicine

## 2015-06-27 DIAGNOSIS — E119 Type 2 diabetes mellitus without complications: Secondary | ICD-10-CM

## 2015-06-27 DIAGNOSIS — M79676 Pain in unspecified toe(s): Secondary | ICD-10-CM

## 2015-06-27 DIAGNOSIS — E1169 Type 2 diabetes mellitus with other specified complication: Secondary | ICD-10-CM

## 2015-06-27 DIAGNOSIS — E669 Obesity, unspecified: Secondary | ICD-10-CM

## 2015-06-27 DIAGNOSIS — B351 Tinea unguium: Secondary | ICD-10-CM

## 2015-06-27 NOTE — Progress Notes (Signed)
Patient ID: Haley Mayer, female   DOB: June 25, 1929, 82 y.o.   MRN: [id number] Subjective: Haley Mayer is a 82 y.o. female patient with history of type 2 diabetes who presents to office today complaining of long, painful nails  while ambulating in shoes; unable to trim. Patient states that the glucose reading this morning was not recorded. Patient denies any new changes in medication or new problems. Patient denies any new cramping, numbness, burning or tingling in the legs.  States that her A1c was elevated when she was last at the doctor.  Patient Active Problem List   Diagnosis Date Noted  . Diabetes mellitus type 2 in obese (HCC) 08/29/2014  . Essential hypertension 08/29/2014  . Leg edema, left 08/29/2014  . Bradycardia 08/08/2014  . LBBB (left bundle branch block) 08/08/2014  . Second degree type I atrioventricular block 08/08/2014  . 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 amLODipine (NORVASC) 10 MG tablet Take 10 mg by mouth every morning.     Marland Kitchen aspirin EC 81 MG tablet Take 1 tablet (81 mg total) by mouth 2 (two) times daily after a meal. 60 tablet 0  . bimatoprost (LUMIGAN) 0.01 % SOLN Place 1 drop into both eyes at bedtime.    . Calcium Carbonate-Vit D-Min (CALCIUM 1200 PO) Take 1 tablet by mouth every morning.    . cholecalciferol (VITAMIN D) 1000 UNITS tablet Take 1,000 Units by mouth every morning.     . diphenhydrAMINE (BENADRYL) 25 MG tablet Take 50 mg by mouth every 6 (six) hours as needed for itching or allergies.    Marland Kitchen docusate sodium (COLACE) 100 MG capsule Take 1 capsule (100 mg total) by mouth 2 (two) times daily. 60 capsule 0  . glimepiride (AMARYL) 4 MG tablet Take 4 mg by mouth 2 (two) times daily.     Marland Kitchen HYDROcodone-acetaminophen (NORCO/VICODIN) 5-325 MG per tablet Take 1-2 tablets by mouth every 4 (four) hours as needed (breakthrough pain). 80 tablet 0   . magnesium oxide (MAG-OX) 400 MG tablet Take 400 mg by mouth 2 (two) times daily.     . multivitamin-iron-minerals-folic acid (CENTRUM) chewable tablet Chew 1 tablet by mouth every morning.     . senna (SENOKOT) 8.6 MG TABS tablet Take 2 tablets (17.2 mg total) by mouth at bedtime. 60 each 0  . simvastatin (ZOCOR) 10 MG tablet Take 10 mg by mouth at bedtime.     . valsartan-hydrochlorothiazide (DIOVAN-HCT) 320-25 MG per tablet Take 1 tablet by mouth every morning.      Current Facility-Administered Medications on File Prior to Visit  Medication Dose Route Frequency Provider Last Rate Last Dose  . dexamethasone (DECADRON) injection 4 mg  4 mg Intravenous Once Samson Frederic, MD      . ondansetron (ZOFRAN) 4 mg in sodium chloride 0.9 % 50 mL IVPB  4 mg Intravenous Once Samson Frederic, MD       Allergies  Allergen Reactions  . Aspirin Nausea And Vomiting    REGULAR STRENGTH= Also upset stomach   . Enalapril Maleate Other (See Comments)    angioedema  . Iodinated Diagnostic Agents Other (See Comments)    Vomiting and hives  . Iohexol Nausea And Vomiting  . Loperamide Hives  . Other Other (See Comments)    Tomatoes--acid  . Primidone Other (See Comments)    unknown unknown  . Vasotec [Enalapril]  unknown     Objective: General: Patient is awake, alert, and oriented x 3 and in no acute distress.  Integument: Skin is warm, dry and supple bilateral. Nails are tender, long, thickened and  dystrophic with subungual debris, consistent with onychomycosis, 1-5 bilateral. No signs of infection. No open lesions or preulcerative lesions present bilateral. Remaining integument unremarkable.  Vasculature:  Dorsalis Pedis pulse 1/4 bilateral. Posterior Tibial pulse 1/4 bilateral.  Capillary fill time <3 sec 1-5 bilateral. Scant hair growth to the level of the digits. Temperature gradient within normal limits. No varicosities present bilateral. No edema present bilateral.   Neurology: The  patient has intact sensation measured with a 5.07/10g Semmes Weinstein Monofilament at all pedal sites bilateral . Vibratory sensation intact bilateral with tuning fork. No Babinski sign present bilateral.   Musculoskeletal: No gross pedal deformities noted bilateral. Muscular strength 5/5 in all lower extremity muscular groups bilateral without pain or limitation on range of motion . No tenderness with calf compression bilateral.  Assessment and Plan: Problem List Items Addressed This Visit      Endocrine   Diabetes mellitus type 2 in obese (HCC)    Other Visit Diagnoses    Dermatophytosis of nail    -  Primary    Pain of toe, unspecified laterality          -Examined patient. -Discussed and educated patient on diabetic foot care, especially with  regards to the vascular, neurological and musculoskeletal systems.  -Stressed the importance of good glycemic control and the detriment of not  controlling glucose levels in relation to the foot. -Mechanically debrided all nails 1-5 bilateral using sterile nail nipper and filed with dremel without incident  -Answered all patient questions -Patient to return in 3 months for at risk foot care -Patient advised to call the office if any problems or questions arise in the  Meantime.  Asencion Islam, DPM

## 2015-09-26 ENCOUNTER — Ambulatory Visit (INDEPENDENT_AMBULATORY_CARE_PROVIDER_SITE_OTHER): Payer: Medicare Other | Admitting: Sports Medicine

## 2015-09-26 ENCOUNTER — Encounter: Payer: Self-pay | Admitting: Sports Medicine

## 2015-09-26 DIAGNOSIS — E119 Type 2 diabetes mellitus without complications: Secondary | ICD-10-CM

## 2015-09-26 DIAGNOSIS — M79676 Pain in unspecified toe(s): Secondary | ICD-10-CM | POA: Diagnosis not present

## 2015-09-26 DIAGNOSIS — E669 Obesity, unspecified: Secondary | ICD-10-CM | POA: Diagnosis not present

## 2015-09-26 DIAGNOSIS — E1169 Type 2 diabetes mellitus with other specified complication: Secondary | ICD-10-CM

## 2015-09-26 DIAGNOSIS — B351 Tinea unguium: Secondary | ICD-10-CM

## 2015-09-26 NOTE — Progress Notes (Signed)
Patient ID: Haley Mayer, female   DOB: [birth year], 82 y.o.   MRN: [id number] Subjective: Haley Mapleslice R Colbaugh is a 82 y.o. female patient with history of type 2 diabetes who presents to office today complaining of long, painful nails  while ambulating in shoes; unable to trim. Patient states that the glucose reading this morning was not recorded, but has been up and down with the highest blood sugar being 200 and the lowest being 89. Patient denies any new changes in medication or new problems. Patient denies any new cramping, numbness, burning or tingling in the legs.  Patient Active Problem List   Diagnosis Date Noted  . Body mass index (BMI) of 40.0-44.9 in adult (HCC) 05/12/2015  . Benign essential HTN 01/07/2015  . Diabetes mellitus type 2 in obese (HCC) 08/29/2014  . Essential hypertension 08/29/2014  . Leg edema, left 08/29/2014  . Bradycardia 08/08/2014  . LBBB (left bundle branch block) 08/08/2014  . Second degree type I atrioventricular block 08/08/2014  . 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 Anterior fascicular with posterior fascicular block 07/25/2014  . Has a tremor 05/03/2014  . Avitaminosis D 05/03/2014  . Arthritis of knee, degenerative 01/30/2014  . Chronic kidney disease (CKD), stage III (moderate) 10/31/2013  . Hypomagnesemia 10/31/2013  . Combined fat and carbohydrate induced hyperlipemia 10/31/2013  . Type 2 diabetes mellitus (HCC) 10/31/2013   Current Outpatient Prescriptions on File Prior to Visit  Medication Sig Dispense Refill  . amLODipine (NORVASC) 10 MG tablet Take 10 mg by mouth every morning.     Marland Kitchen. aspirin EC 81 MG tablet Take 1 tablet (81 mg total) by mouth 2 (two) times daily after a meal. 60 tablet 0  . bimatoprost (LUMIGAN) 0.01 % SOLN Place 1 drop into both eyes at bedtime.    . Calcium Carbonate-Vit D-Min (CALCIUM 1200 PO) Take 1 tablet by mouth every morning.    .  cholecalciferol (VITAMIN D) 1000 UNITS tablet Take 1,000 Units by mouth every morning.     . diphenhydrAMINE (BENADRYL) 25 MG tablet Take 50 mg by mouth every 6 (six) hours as needed for itching or allergies.    Marland Kitchen. docusate sodium (COLACE) 100 MG capsule Take 1 capsule (100 mg total) by mouth 2 (two) times daily. 60 capsule 0  . glimepiride (AMARYL) 4 MG tablet Take 4 mg by mouth 2 (two) times daily.     Marland Kitchen. HYDROcodone-acetaminophen (NORCO/VICODIN) 5-325 MG per tablet Take 1-2 tablets by mouth every 4 (four) hours as needed (breakthrough pain). 80 tablet 0  . magnesium oxide (MAG-OX) 400 MG tablet Take 400 mg by mouth 2 (two) times daily.     . multivitamin-iron-minerals-folic acid (CENTRUM) chewable tablet Chew 1 tablet by mouth every morning.     . senna (SENOKOT) 8.6 MG TABS tablet Take 2 tablets (17.2 mg total) by mouth at bedtime. 60 each 0  . simvastatin (ZOCOR) 10 MG tablet Take 10 mg by mouth at bedtime.     . valsartan-hydrochlorothiazide (DIOVAN-HCT) 320-25 MG per tablet Take 1 tablet by mouth every morning.      Current Facility-Administered Medications on File Prior to Visit  Medication Dose Route Frequency Provider Last Rate Last Dose  . dexamethasone (DECADRON) injection 4 mg  4 mg Intravenous Once Samson FredericBrian Swinteck, MD      . ondansetron [facility](ZOFRAN) 4 mg in sodium chloride 0.9 % 50 mL IVPB  4 mg Intravenous Once Arlys JohnBrian  Swinteck, MD       Allergies  Allergen Reactions  . Aspirin Nausea And Vomiting    REGULAR STRENGTH= Also upset stomach   . Enalapril Maleate Other (See Comments)    angioedema  . Iodinated Diagnostic Agents Other (See Comments)    Vomiting and hives  . Iohexol Nausea And Vomiting  . Loperamide Hives  . Other Other (See Comments)    Tomatoes--acid  . Primidone Other (See Comments)    unknown unknown  . Vasotec [Enalapril]     unknown     Objective: General: Patient is awake, alert, and oriented x 3 and in no acute distress.  Integument: Skin is warm, dry  and supple bilateral. Nails are tender, long, thickened and  dystrophic with subungual debris, consistent with onychomycosis, 1-5 bilateral. No signs of infection. No open lesions or preulcerative lesions present bilateral. Remaining integument unremarkable.  Vasculature:  Dorsalis Pedis pulse 1/4 bilateral. Posterior Tibial pulse 1/4 bilateral.  Capillary fill time <3 sec 1-5 bilateral. Scant hair growth to the level of the digits. Temperature gradient within normal limits. No varicosities present bilateral. No edema present bilateral.   Neurology: The patient has intact sensation measured with a 5.07/10g Semmes Weinstein Monofilament at all pedal sites bilateral . Vibratory sensation intact bilateral with tuning fork. No Babinski sign present bilateral.   Musculoskeletal: No gross pedal deformities noted bilateral. Muscular strength 5/5 in all lower extremity muscular groups bilateral without pain or limitation on range of motion . No tenderness with calf compression bilateral.  Assessment and Plan: Problem List Items Addressed This Visit      Endocrine   Diabetes mellitus type 2 in obese (HCC)    Other Visit Diagnoses    Dermatophytosis of nail    -  Primary    Pain of toe, unspecified laterality          -Examined patient. -Discussed and educated patient on diabetic foot care, especially with  regards to the vascular, neurological and musculoskeletal systems.  -Stressed the importance of good glycemic control and the detriment of not  controlling glucose levels in relation to the foot. -Mechanically debrided all nails 1-5 bilateral using sterile nail nipper and filed with dremel without incident  -Answered all patient questions -Patient to return in 3 months for at risk foot care -Patient advised to call the office if any problems or questions arise in the  Meantime.  Asencion Islam, DPM

## 2015-11-27 ENCOUNTER — Other Ambulatory Visit: Payer: Self-pay | Admitting: Internal Medicine

## 2015-12-01 ENCOUNTER — Other Ambulatory Visit: Payer: Self-pay | Admitting: Internal Medicine

## 2015-12-01 DIAGNOSIS — Z1231 Encounter for screening mammogram for malignant neoplasm of breast: Secondary | ICD-10-CM

## 2015-12-22 ENCOUNTER — Telehealth: Payer: Self-pay | Admitting: Cardiovascular Disease

## 2015-12-22 NOTE — Telephone Encounter (Signed)
Patient did not want to schedule OD fu from recall list. Patient asked not to be called again.  Deleting recall.

## 2015-12-26 ENCOUNTER — Ambulatory Visit (INDEPENDENT_AMBULATORY_CARE_PROVIDER_SITE_OTHER): Payer: Medicare Other | Admitting: Sports Medicine

## 2015-12-26 ENCOUNTER — Encounter: Payer: Self-pay | Admitting: Sports Medicine

## 2015-12-26 DIAGNOSIS — E119 Type 2 diabetes mellitus without complications: Secondary | ICD-10-CM | POA: Diagnosis not present

## 2015-12-26 DIAGNOSIS — M79676 Pain in unspecified toe(s): Secondary | ICD-10-CM

## 2015-12-26 DIAGNOSIS — E1169 Type 2 diabetes mellitus with other specified complication: Secondary | ICD-10-CM

## 2015-12-26 DIAGNOSIS — E669 Obesity, unspecified: Secondary | ICD-10-CM

## 2015-12-26 DIAGNOSIS — B351 Tinea unguium: Secondary | ICD-10-CM

## 2015-12-26 NOTE — Progress Notes (Signed)
Patient ID: Haley Mayer, female   DOB: 1929-10-02, 82 y.o.   MRN: [id number] Subjective: Haley Mapleslice R Dawes is a 82 y.o. female patient with history of type 2 diabetes who returns to office today complaining of long, painful nails while ambulating in shoes; unable to trim. Patient states that the glucose reading this morning was not recorded, but was good. Patient denies any new changes in medication or new problems. Patient denies any new cramping, numbness, burning or tingling in the legs.  Patient Active Problem List   Diagnosis Date Noted  . Body mass index (BMI) of 40.0-44.9 in adult (HCC) 05/12/2015  . Benign essential HTN 01/07/2015  . Diabetes mellitus type 2 in obese (HCC) 08/29/2014  . Essential hypertension 08/29/2014  . Leg edema, left 08/29/2014  . Bradycardia 08/08/2014  . LBBB (left bundle branch block) 08/08/2014  . Second degree type I atrioventricular block 08/08/2014  . 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 Anterior fascicular with posterior fascicular block 07/25/2014  . Has a tremor 05/03/2014  . Avitaminosis D 05/03/2014  . Arthritis of knee, degenerative 01/30/2014  . Chronic kidney disease (CKD), stage III (moderate) 10/31/2013  . Hypomagnesemia 10/31/2013  . Combined fat and carbohydrate induced hyperlipemia 10/31/2013  . Type 2 diabetes mellitus (HCC) 10/31/2013   Current Outpatient Prescriptions on File Prior to Visit  Medication Sig Dispense Refill  . amLODipine (NORVASC) 10 MG tablet Take 10 mg by mouth every morning.     Marland Kitchen. aspirin EC 81 MG tablet Take 1 tablet (81 mg total) by mouth 2 (two) times daily after a meal. 60 tablet 0  . bimatoprost (LUMIGAN) 0.01 % SOLN Place 1 drop into both eyes at bedtime.    . Calcium Carbonate-Vit D-Min (CALCIUM 1200 PO) Take 1 tablet by mouth every morning.    . cholecalciferol (VITAMIN D) 1000 UNITS tablet Take 1,000 Units by mouth every  morning.     . diphenhydrAMINE (BENADRYL) 25 MG tablet Take 50 mg by mouth every 6 (six) hours as needed for itching or allergies.    Marland Kitchen. docusate sodium (COLACE) 100 MG capsule Take 1 capsule (100 mg total) by mouth 2 (two) times daily. 60 capsule 0  . glimepiride (AMARYL) 4 MG tablet Take 4 mg by mouth 2 (two) times daily.     Marland Kitchen. HYDROcodone-acetaminophen (NORCO/VICODIN) 5-325 MG per tablet Take 1-2 tablets by mouth every 4 (four) hours as needed (breakthrough pain). 80 tablet 0  . magnesium oxide (MAG-OX) 400 MG tablet Take 400 mg by mouth 2 (two) times daily.     . multivitamin-iron-minerals-folic acid (CENTRUM) chewable tablet Chew 1 tablet by mouth every morning.     . senna (SENOKOT) 8.6 MG TABS tablet Take 2 tablets (17.2 mg total) by mouth at bedtime. 60 each 0  . simvastatin (ZOCOR) 10 MG tablet Take 10 mg by mouth at bedtime.     . valsartan-hydrochlorothiazide (DIOVAN-HCT) 320-25 MG per tablet Take 1 tablet by mouth every morning.      Current Facility-Administered Medications on File Prior to Visit  Medication Dose Route Frequency Provider Last Rate Last Dose  . dexamethasone (DECADRON) injection 4 mg  4 mg Intravenous Once Samson FredericBrian Swinteck, MD      . ondansetron (ZOFRAN) 4 mg in sodium chloride 0.9 % 50 mL IVPB  4 mg Intravenous Once Samson FredericBrian Swinteck, MD       Allergies  Allergen Reactions  . Aspirin Nausea  And Vomiting    REGULAR STRENGTH= Also upset stomach   . Enalapril Maleate Other (See Comments)    angioedema  . Iodinated Diagnostic Agents Other (See Comments)    Vomiting and hives  . Iohexol Nausea And Vomiting  . Loperamide Hives  . Other Other (See Comments)    Tomatoes--acid  . Primidone Other (See Comments)    unknown unknown  . Vasotec [Enalapril]     unknown     Objective: General: Patient is awake, alert, and oriented x 3 and in no acute distress.  Integument: Skin is warm, dry and supple bilateral. Nails are tender, long, thickened and  dystrophic with  subungual debris, consistent with onychomycosis, 1-5 bilateral. No signs of infection. No open lesions or preulcerative lesions present bilateral. Remaining integument unremarkable.  Vasculature:  Dorsalis Pedis pulse 1/4 bilateral. Posterior Tibial pulse 1/4 bilateral.  Capillary fill time <3 sec 1-5 bilateral. Scant hair growth to the level of the digits. Temperature gradient within normal limits. No varicosities present bilateral. No edema present bilateral.   Neurology: The patient has intact sensation measured with a 5.07/10g Semmes Weinstein Monofilament at all pedal sites bilateral . Vibratory sensation intact bilateral with tuning fork. No Babinski sign present bilateral.   Musculoskeletal: No gross pedal deformities noted bilateral. Muscular strength 5/5 in all lower extremity muscular groups bilateral without pain or limitation on range of motion . No tenderness with calf compression bilateral.  Assessment and Plan: Problem List Items Addressed This Visit      Endocrine   Diabetes mellitus type 2 in obese (HCC)    Other Visit Diagnoses    Dermatophytosis of nail    -  Primary   Pain of toe, unspecified laterality         -Examined patient. -Discussed and educated patient on diabetic foot care, especially with  regards to the vascular, neurological and musculoskeletal systems.  -Stressed the importance of good glycemic control and the detriment of not  controlling glucose levels in relation to the foot. -Mechanically debrided all nails 1-5 bilateral using sterile nail nipper and filed with dremel without incident  -Answered all patient questions -Patient to return in 3 months for at risk foot care -Patient advised to call the office if any problems or questions arise in the  Meantime.  Asencion Islam, DPM

## 2016-01-07 ENCOUNTER — Other Ambulatory Visit: Payer: Self-pay | Admitting: Internal Medicine

## 2016-01-07 ENCOUNTER — Ambulatory Visit
Admission: RE | Admit: 2016-01-07 | Discharge: 2016-01-07 | Disposition: A | Discharge status: Home / Self Care | Payer: Medicare Other | Source: Ambulatory Visit | Attending: Internal Medicine | Admitting: Internal Medicine

## 2016-01-07 DIAGNOSIS — Z1231 Encounter for screening mammogram for malignant neoplasm of breast: Secondary | ICD-10-CM | POA: Diagnosis not present

## 2016-04-02 ENCOUNTER — Ambulatory Visit (INDEPENDENT_AMBULATORY_CARE_PROVIDER_SITE_OTHER): Payer: Medicare Other | Admitting: Podiatry

## 2016-04-02 VITALS — BP 145/67 | HR 70 | Resp 16

## 2016-04-02 DIAGNOSIS — M79609 Pain in unspecified limb: Secondary | ICD-10-CM

## 2016-04-02 DIAGNOSIS — B351 Tinea unguium: Secondary | ICD-10-CM

## 2016-04-02 DIAGNOSIS — E0843 Diabetes mellitus due to underlying condition with diabetic autonomic (poly)neuropathy: Secondary | ICD-10-CM | POA: Diagnosis not present

## 2016-04-02 DIAGNOSIS — L603 Nail dystrophy: Secondary | ICD-10-CM

## 2016-04-02 DIAGNOSIS — L608 Other nail disorders: Secondary | ICD-10-CM

## 2016-04-18 NOTE — Progress Notes (Signed)
SUBJECTIVE Patient with a history of diabetes mellitus presents to office today complaining of elongated, thickened nails. Pain while ambulating in shoes. Patient is unable to trim their own nails.   Allergies  Allergen Reactions  . Aspirin Nausea And Vomiting    REGULAR STRENGTH= Also upset stomach   . Enalapril Maleate Other (See Comments)    angioedema  . Iodinated Diagnostic Agents Other (See Comments)    Vomiting and hives  . Iohexol Nausea And Vomiting  . Loperamide Hives  . Other Other (See Comments)    Tomatoes--acid  . Primidone Other (See Comments)    unknown unknown  . Vasotec [Enalapril]     unknown    OBJECTIVE General Patient is awake, alert, and oriented x 3 and in no acute distress. Derm Skin is dry and supple bilateral. Negative open lesions or macerations. Remaining integument unremarkable. Nails are tender, long, thickened and dystrophic with subungual debris, consistent with onychomycosis, 1-5 bilateral. No signs of infection noted. Vasc  DP and PT pedal pulses palpable bilaterally. Temperature gradient within normal limits.  Neuro Epicritic and protective threshold sensation diminished bilaterally.  Musculoskeletal Exam No symptomatic pedal deformities noted bilateral. Muscular strength within normal limits.  ASSESSMENT 1. Diabetes Mellitus w/ peripheral neuropathy 2. Onychomycosis of nail due to dermatophyte bilateral 3. Pain in foot bilateral  PLAN OF CARE 1. Patient evaluated today. 2. Instructed to maintain good pedal hygiene and foot care. Stressed importance of controlling blood sugar.  3. Mechanical debridement of nails 1-5 bilaterally performed using a nail nipper. Filed with dremel without incident.  4. Return to clinic in 3 mos.    Felecia ShellingBrent M Evans, DPM

## 2016-07-06 ENCOUNTER — Encounter: Payer: Self-pay | Admitting: Podiatry

## 2016-07-06 ENCOUNTER — Ambulatory Visit (INDEPENDENT_AMBULATORY_CARE_PROVIDER_SITE_OTHER): Payer: Medicare HMO | Admitting: Podiatry

## 2016-07-06 DIAGNOSIS — L608 Other nail disorders: Secondary | ICD-10-CM

## 2016-07-06 DIAGNOSIS — L603 Nail dystrophy: Secondary | ICD-10-CM | POA: Diagnosis not present

## 2016-07-06 DIAGNOSIS — E0843 Diabetes mellitus due to underlying condition with diabetic autonomic (poly)neuropathy: Secondary | ICD-10-CM | POA: Diagnosis not present

## 2016-07-06 DIAGNOSIS — B351 Tinea unguium: Secondary | ICD-10-CM | POA: Diagnosis not present

## 2016-07-06 DIAGNOSIS — M79609 Pain in unspecified limb: Secondary | ICD-10-CM | POA: Diagnosis not present

## 2016-07-06 NOTE — Progress Notes (Signed)
   SUBJECTIVE Patient with a history of diabetes mellitus presents to office today complaining of elongated, thickened nails. Pain while ambulating in shoes. Patient is unable to trim their own nails.   OBJECTIVE General Patient is awake, alert, and oriented x 3 and in no acute distress. Derm Skin is dry and supple bilateral. Negative open lesions or macerations. Remaining integument unremarkable. Nails are tender, long, thickened and dystrophic with subungual debris, consistent with onychomycosis, 1-5 bilateral. No signs of infection noted. Vasc  DP and PT pedal pulses palpable bilaterally. Temperature gradient within normal limits.  Neuro Epicritic and protective threshold sensation diminished bilaterally.  Musculoskeletal Exam No symptomatic pedal deformities noted bilateral. Muscular strength within normal limits.  ASSESSMENT 1. Diabetes Mellitus w/ peripheral neuropathy 2. Onychomycosis of nail due to dermatophyte bilateral 3. Pain in foot bilateral  PLAN OF CARE 1. Patient evaluated today. 2. Instructed to maintain good pedal hygiene and foot care. Stressed importance of controlling blood sugar.  3. Mechanical debridement of nails 1-5 bilaterally performed using a nail nipper. Filed with dremel without incident.  4. Return to clinic in 3 mos.     Keldon Lassen M. Gaddiel Cullens, DPM [facility]  Dr. Derreck Wiltsey M. Blanka Rockholt, DPM    [address]                                        [place], [place] [address]                Office [phone]  Fax [phone]       

## 2016-10-05 ENCOUNTER — Encounter: Payer: Self-pay | Admitting: Podiatry

## 2016-10-05 ENCOUNTER — Ambulatory Visit (INDEPENDENT_AMBULATORY_CARE_PROVIDER_SITE_OTHER): Payer: Medicare HMO | Admitting: Podiatry

## 2016-10-05 DIAGNOSIS — B351 Tinea unguium: Secondary | ICD-10-CM

## 2016-10-05 DIAGNOSIS — E0842 Diabetes mellitus due to underlying condition with diabetic polyneuropathy: Secondary | ICD-10-CM | POA: Diagnosis not present

## 2016-10-05 DIAGNOSIS — M79676 Pain in unspecified toe(s): Secondary | ICD-10-CM | POA: Diagnosis not present

## 2016-10-05 NOTE — Progress Notes (Signed)
   SUBJECTIVE Patient with a history of diabetes mellitus presents to office today complaining of elongated, thickened nails. Pain while ambulating in shoes. Patient is unable to trim their own nails.   OBJECTIVE General Patient is awake, alert, and oriented x 3 and in no acute distress. Derm Skin is dry and supple bilateral. Negative open lesions or macerations. Remaining integument unremarkable. Nails are tender, long, thickened and dystrophic with subungual debris, consistent with onychomycosis, 1-5 bilateral. No signs of infection noted. Vasc  DP and PT pedal pulses palpable bilaterally. Temperature gradient within normal limits.  Neuro Epicritic and protective threshold sensation diminished bilaterally.  Musculoskeletal Exam No symptomatic pedal deformities noted bilateral. Muscular strength within normal limits.  ASSESSMENT 1. Diabetes Mellitus w/ peripheral neuropathy 2. Onychomycosis of nail due to dermatophyte bilateral 3. Pain in foot bilateral  PLAN OF CARE 1. Patient evaluated today. 2. Instructed to maintain good pedal hygiene and foot care. Stressed importance of controlling blood sugar.  3. Mechanical debridement of nails 1-5 bilaterally performed using a nail nipper. Filed with dremel without incident.  4. Return to clinic in 3 mos.     Perri Aragones M. Nigel Ericsson, DPM [facility]  Dr. Corrado Hymon M. Ajax Schroll, DPM    [address]                                        [place], [place] [address]                Office [phone]  Fax [phone]       

## 2016-11-08 ENCOUNTER — Other Ambulatory Visit: Payer: Self-pay | Admitting: Internal Medicine

## 2016-11-08 DIAGNOSIS — Z1231 Encounter for screening mammogram for malignant neoplasm of breast: Secondary | ICD-10-CM

## 2016-11-08 DIAGNOSIS — Z803 Family history of malignant neoplasm of breast: Secondary | ICD-10-CM

## 2017-01-07 ENCOUNTER — Ambulatory Visit
Admission: RE | Admit: 2017-01-07 | Discharge: 2017-01-07 | Disposition: A | Discharge status: Home / Self Care | Payer: Medicare HMO | Source: Ambulatory Visit | Attending: Internal Medicine | Admitting: Internal Medicine

## 2017-01-07 DIAGNOSIS — Z1231 Encounter for screening mammogram for malignant neoplasm of breast: Secondary | ICD-10-CM | POA: Diagnosis present

## 2017-01-07 DIAGNOSIS — Z803 Family history of malignant neoplasm of breast: Secondary | ICD-10-CM

## 2017-01-11 ENCOUNTER — Ambulatory Visit (INDEPENDENT_AMBULATORY_CARE_PROVIDER_SITE_OTHER): Payer: Medicare HMO | Admitting: Podiatry

## 2017-01-11 DIAGNOSIS — M79676 Pain in unspecified toe(s): Secondary | ICD-10-CM

## 2017-01-11 DIAGNOSIS — B351 Tinea unguium: Secondary | ICD-10-CM | POA: Diagnosis not present

## 2017-01-11 DIAGNOSIS — E0842 Diabetes mellitus due to underlying condition with diabetic polyneuropathy: Secondary | ICD-10-CM | POA: Diagnosis not present

## 2017-01-17 NOTE — Progress Notes (Signed)
   SUBJECTIVE Patient with a history of diabetes mellitus presents to office today complaining of elongated, thickened nails. Pain while ambulating in shoes. Patient is unable to trim their own nails.   OBJECTIVE General Patient is awake, alert, and oriented x 3 and in no acute distress. Derm Skin is dry and supple bilateral. Negative open lesions or macerations. Remaining integument unremarkable. Nails are tender, long, thickened and dystrophic with subungual debris, consistent with onychomycosis, 1-5 bilateral. No signs of infection noted. Vasc  DP and PT pedal pulses palpable bilaterally. Temperature gradient within normal limits.  Neuro Epicritic and protective threshold sensation diminished bilaterally.  Musculoskeletal Exam No symptomatic pedal deformities noted bilateral. Muscular strength within normal limits.  ASSESSMENT 1. Diabetes Mellitus w/ peripheral neuropathy 2. Onychomycosis of nail due to dermatophyte bilateral 3. Pain in foot bilateral  PLAN OF CARE 1. Patient evaluated today. 2. Instructed to maintain good pedal hygiene and foot care. Stressed importance of controlling blood sugar.  3. Mechanical debridement of nails 1-5 bilaterally performed using a nail nipper. Filed with dremel without incident.  4. Return to clinic in 3 mos.     Shakim Faith M. Tykesha Konicki, DPM [facility]  Dr. Noell Lorensen M. Marvella Jenning, DPM    [address]                                        [place], [place] [address]                Office [phone]  Fax [phone]       

## 2017-04-12 ENCOUNTER — Encounter: Payer: Self-pay | Admitting: Podiatry

## 2017-04-12 ENCOUNTER — Ambulatory Visit: Payer: Medicare HMO | Admitting: Podiatry

## 2017-04-12 DIAGNOSIS — E0842 Diabetes mellitus due to underlying condition with diabetic polyneuropathy: Secondary | ICD-10-CM | POA: Diagnosis not present

## 2017-04-12 DIAGNOSIS — M79676 Pain in unspecified toe(s): Secondary | ICD-10-CM | POA: Diagnosis not present

## 2017-04-12 DIAGNOSIS — B351 Tinea unguium: Secondary | ICD-10-CM | POA: Diagnosis not present

## 2017-04-14 NOTE — Progress Notes (Signed)
   SUBJECTIVE Patient with a history of diabetes mellitus presents to office today complaining of elongated, thickened nails. Pain while ambulating in shoes. Patient is unable to trim their own nails.   Past Medical History:  Diagnosis Date  . Arthritis   . Chronic kidney disease    function no 100% per patient   . Diabetes mellitus without complication (HCC)    Type II  . Glaucoma   . Hyperlipidemia   . Hypertension     OBJECTIVE General Patient is awake, alert, and oriented x 3 and in no acute distress. Derm Skin is dry and supple bilateral. Negative open lesions or macerations. Remaining integument unremarkable. Nails are tender, long, thickened and dystrophic with subungual debris, consistent with onychomycosis, 1-5 bilateral. No signs of infection noted. Vasc  DP and PT pedal pulses palpable bilaterally. Temperature gradient within normal limits.  Neuro Epicritic and protective threshold sensation diminished bilaterally.  Musculoskeletal Exam No symptomatic pedal deformities noted bilateral. Muscular strength within normal limits.  ASSESSMENT 1. Diabetes Mellitus w/ peripheral neuropathy 2. Onychomycosis of nail due to dermatophyte bilateral 3. Pain in foot bilateral  PLAN OF CARE 1. Patient evaluated today. 2. Instructed to maintain good pedal hygiene and foot care. Stressed importance of controlling blood sugar.  3. Mechanical debridement of nails 1-5 bilaterally performed using a nail nipper. Filed with dremel without incident.  4. Return to clinic in 3 mos.     Felecia ShellingBrent M. Hlee Fringer, DPM [facility]  Dr. Felecia ShellingBrent M. Sadia Belfiore, DPM    [address]                                        [place], [place] [id number]                Office [phone]  Fax [phone]

## 2017-07-15 ENCOUNTER — Encounter: Payer: Self-pay | Admitting: Podiatry

## 2017-07-15 ENCOUNTER — Ambulatory Visit (INDEPENDENT_AMBULATORY_CARE_PROVIDER_SITE_OTHER): Payer: Medicare HMO | Admitting: Podiatry

## 2017-07-15 DIAGNOSIS — E0842 Diabetes mellitus due to underlying condition with diabetic polyneuropathy: Secondary | ICD-10-CM | POA: Diagnosis not present

## 2017-07-15 DIAGNOSIS — M79676 Pain in unspecified toe(s): Secondary | ICD-10-CM

## 2017-07-15 DIAGNOSIS — B351 Tinea unguium: Secondary | ICD-10-CM | POA: Diagnosis not present

## 2017-07-18 NOTE — Progress Notes (Signed)
   SUBJECTIVE Patient with a history of diabetes mellitus presents to office today complaining of elongated, thickened nails. Pain while ambulating in shoes. Patient is unable to trim their own nails.   Past Medical History:  Diagnosis Date  . Arthritis   . Chronic kidney disease    function no 100% per patient   . Diabetes mellitus without complication (HCC)    Type II  . Glaucoma   . Hyperlipidemia   . Hypertension     OBJECTIVE General Patient is awake, alert, and oriented x 3 and in no acute distress. Derm Skin is dry and supple bilateral. Negative open lesions or macerations. Remaining integument unremarkable. Nails are tender, long, thickened and dystrophic with subungual debris, consistent with onychomycosis, 1-5 bilateral. No signs of infection noted. Vasc  DP and PT pedal pulses palpable bilaterally. Temperature gradient within normal limits.  Neuro Epicritic and protective threshold sensation diminished bilaterally.  Musculoskeletal Exam No symptomatic pedal deformities noted bilateral. Muscular strength within normal limits.  ASSESSMENT 1. Diabetes Mellitus w/ peripheral neuropathy 2. Onychomycosis of nail due to dermatophyte bilateral 3. Pain in foot bilateral  PLAN OF CARE 1. Patient evaluated today. 2. Instructed to maintain good pedal hygiene and foot care. Stressed importance of controlling blood sugar.  3. Mechanical debridement of nails 1-5 bilaterally performed using a nail nipper. Filed with dremel without incident.  4. Return to clinic in 3 mos.     Olin Gurski M. Romualdo Prosise, DPM [facility]  Dr. Nameer Summer M. Kynslei Art, DPM    [address]                                        [place], [place] [address]                Office [phone]  Fax [phone]      

## 2017-09-20 ENCOUNTER — Other Ambulatory Visit: Payer: Self-pay | Admitting: Internal Medicine

## 2017-09-20 DIAGNOSIS — N6310 Unspecified lump in the right breast, unspecified quadrant: Secondary | ICD-10-CM

## 2017-09-27 ENCOUNTER — Ambulatory Visit
Admission: RE | Admit: 2017-09-27 | Discharge: 2017-09-27 | Disposition: A | Discharge status: Home / Self Care | Payer: Medicare HMO | Source: Ambulatory Visit | Attending: Internal Medicine | Admitting: Internal Medicine

## 2017-09-27 DIAGNOSIS — N6311 Unspecified lump in the right breast, upper outer quadrant: Secondary | ICD-10-CM | POA: Diagnosis present

## 2017-09-27 DIAGNOSIS — N6310 Unspecified lump in the right breast, unspecified quadrant: Secondary | ICD-10-CM

## 2017-10-21 ENCOUNTER — Encounter: Payer: Self-pay | Admitting: Podiatry

## 2017-10-21 ENCOUNTER — Ambulatory Visit: Payer: Medicare HMO | Admitting: Podiatry

## 2017-10-21 DIAGNOSIS — E0842 Diabetes mellitus due to underlying condition with diabetic polyneuropathy: Secondary | ICD-10-CM | POA: Diagnosis not present

## 2017-10-21 DIAGNOSIS — B351 Tinea unguium: Secondary | ICD-10-CM

## 2017-10-21 DIAGNOSIS — M79676 Pain in unspecified toe(s): Secondary | ICD-10-CM

## 2017-10-24 NOTE — Progress Notes (Signed)
   SUBJECTIVE Patient with a history of diabetes mellitus presents to office today complaining of elongated, thickened nails that cause pain while ambulating in shoes. She is unable to trim her own nails. Patient is here for further evaluation and treatment.   Past Medical History:  Diagnosis Date  . Arthritis   . Chronic kidney disease    function no 100% per patient   . Diabetes mellitus without complication (HCC)    Type II  . Glaucoma   . Hyperlipidemia   . Hypertension     OBJECTIVE General Patient is awake, alert, and oriented x 3 and in no acute distress. Derm Skin is dry and supple bilateral. Negative open lesions or macerations. Remaining integument unremarkable. Nails are tender, long, thickened and dystrophic with subungual debris, consistent with onychomycosis, 1-5 bilateral. No signs of infection noted. Vasc  DP and PT pedal pulses palpable bilaterally. Temperature gradient within normal limits.  Neuro Epicritic and protective threshold sensation diminished bilaterally.  Musculoskeletal Exam No symptomatic pedal deformities noted bilateral. Muscular strength within normal limits.  ASSESSMENT 1. Diabetes Mellitus w/ peripheral neuropathy 2. Onychomycosis of nail due to dermatophyte bilateral 3. Pain in foot bilateral  PLAN OF CARE 1. Patient evaluated today. 2. Instructed to maintain good pedal hygiene and foot care. Stressed importance of controlling blood sugar.  3. Mechanical debridement of nails 1-5 bilaterally performed using a nail nipper. Filed with dremel without incident.  4. Return to clinic in 3 mos.     Emelia Sandoval M. Kiran Lapine, DPM [facility]  Dr. Chandler Swiderski M. Fletcher Ostermiller, DPM    [address]                                        [place], [place] [address]                Office [phone]  Fax [phone]      

## 2017-10-31 DIAGNOSIS — B351 Tinea unguium: Secondary | ICD-10-CM | POA: Insufficient documentation

## 2017-11-01 ENCOUNTER — Other Ambulatory Visit: Payer: Self-pay | Admitting: Internal Medicine

## 2017-11-01 DIAGNOSIS — N6310 Unspecified lump in the right breast, unspecified quadrant: Secondary | ICD-10-CM

## 2017-11-07 ENCOUNTER — Other Ambulatory Visit: Payer: Self-pay | Admitting: Internal Medicine

## 2017-11-07 DIAGNOSIS — N6310 Unspecified lump in the right breast, unspecified quadrant: Secondary | ICD-10-CM

## 2018-01-09 ENCOUNTER — Ambulatory Visit
Admission: RE | Admit: 2018-01-09 | Discharge: 2018-01-09 | Disposition: A | Discharge status: Home / Self Care | Payer: Medicare HMO | Source: Ambulatory Visit | Attending: Internal Medicine | Admitting: Internal Medicine

## 2018-01-09 DIAGNOSIS — N6310 Unspecified lump in the right breast, unspecified quadrant: Secondary | ICD-10-CM | POA: Diagnosis not present

## 2018-01-27 ENCOUNTER — Ambulatory Visit: Payer: Medicare HMO | Admitting: Podiatry

## 2018-02-14 ENCOUNTER — Encounter: Payer: Self-pay | Admitting: Podiatry

## 2018-02-14 ENCOUNTER — Ambulatory Visit: Payer: Medicare HMO | Admitting: Podiatry

## 2018-02-14 DIAGNOSIS — M79676 Pain in unspecified toe(s): Secondary | ICD-10-CM | POA: Diagnosis not present

## 2018-02-14 DIAGNOSIS — B351 Tinea unguium: Secondary | ICD-10-CM

## 2018-02-14 DIAGNOSIS — E0842 Diabetes mellitus due to underlying condition with diabetic polyneuropathy: Secondary | ICD-10-CM

## 2018-02-16 NOTE — Progress Notes (Signed)
   SUBJECTIVE Patient with a history of diabetes mellitus presents to office today complaining of elongated, thickened nails that cause pain while ambulating in shoes. She is unable to trim her own nails. Patient is here for further evaluation and treatment.   Past Medical History:  Diagnosis Date  . Arthritis   . Chronic kidney disease    function no 100% per patient   . Diabetes mellitus without complication (HCC)    Type II  . Glaucoma   . Hyperlipidemia   . Hypertension     OBJECTIVE General Patient is awake, alert, and oriented x 3 and in no acute distress. Derm Skin is dry and supple bilateral. Negative open lesions or macerations. Remaining integument unremarkable. Nails are tender, long, thickened and dystrophic with subungual debris, consistent with onychomycosis, 1-5 bilateral. No signs of infection noted. Vasc  DP and PT pedal pulses palpable bilaterally. Temperature gradient within normal limits.  Neuro Epicritic and protective threshold sensation diminished bilaterally.  Musculoskeletal Exam No symptomatic pedal deformities noted bilateral. Muscular strength within normal limits.  ASSESSMENT 1. Diabetes Mellitus w/ peripheral neuropathy 2. Onychomycosis of nail due to dermatophyte bilateral 3. Pain in foot bilateral  PLAN OF CARE 1. Patient evaluated today. 2. Instructed to maintain good pedal hygiene and foot care. Stressed importance of controlling blood sugar.  3. Mechanical debridement of nails 1-5 bilaterally performed using a nail nipper. Filed with dremel without incident.  4. Return to clinic in 3 mos.     Brent M. Evans, DPM [facility]  Dr. Brent M. Evans, DPM    [address]                                        ,  [address]                Office [phone]  Fax [phone]      

## 2018-05-16 ENCOUNTER — Ambulatory Visit: Payer: Medicare HMO | Admitting: Podiatry

## 2018-05-16 ENCOUNTER — Encounter: Payer: Self-pay | Admitting: Podiatry

## 2018-05-16 DIAGNOSIS — M79676 Pain in unspecified toe(s): Secondary | ICD-10-CM | POA: Diagnosis not present

## 2018-05-16 DIAGNOSIS — B351 Tinea unguium: Secondary | ICD-10-CM

## 2018-05-16 DIAGNOSIS — E0842 Diabetes mellitus due to underlying condition with diabetic polyneuropathy: Secondary | ICD-10-CM

## 2018-05-19 NOTE — Progress Notes (Signed)
   SUBJECTIVE Patient with a history of diabetes mellitus presents to office today complaining of elongated, thickened nails that cause pain while ambulating in shoes. She is unable to trim her own nails. Patient is here for further evaluation and treatment.   Past Medical History:  Diagnosis Date  . Arthritis   . Chronic kidney disease    function no 100% per patient   . Diabetes mellitus without complication (HCC)    Type II  . Glaucoma   . Hyperlipidemia   . Hypertension     OBJECTIVE General Patient is awake, alert, and oriented x 3 and in no acute distress. Derm Skin is dry and supple bilateral. Negative open lesions or macerations. Remaining integument unremarkable. Nails are tender, long, thickened and dystrophic with subungual debris, consistent with onychomycosis, 1-5 bilateral. No signs of infection noted. Vasc  DP and PT pedal pulses palpable bilaterally. Temperature gradient within normal limits.  Neuro Epicritic and protective threshold sensation diminished bilaterally.  Musculoskeletal Exam No symptomatic pedal deformities noted bilateral. Muscular strength within normal limits.  ASSESSMENT 1. Diabetes Mellitus w/ peripheral neuropathy 2. Onychomycosis of nail due to dermatophyte bilateral 3. Pain in foot bilateral  PLAN OF CARE 1. Patient evaluated today. 2. Instructed to maintain good pedal hygiene and foot care. Stressed importance of controlling blood sugar.  3. Mechanical debridement of nails 1-5 bilaterally performed using a nail nipper. Filed with dremel without incident.  4. Return to clinic in 3 mos.     Felecia Shelling, DPM [facility]  Dr. Felecia Shelling, DPM    [address]                                        [place], [place] [address]                Office [phone]  Fax [phone]

## 2018-06-19 DIAGNOSIS — G479 Sleep disorder, unspecified: Secondary | ICD-10-CM | POA: Insufficient documentation

## 2018-06-19 DIAGNOSIS — R413 Other amnesia: Secondary | ICD-10-CM | POA: Insufficient documentation

## 2018-08-18 ENCOUNTER — Ambulatory Visit: Payer: Medicare HMO | Admitting: Podiatry

## 2019-02-19 ENCOUNTER — Other Ambulatory Visit: Payer: Self-pay | Admitting: Internal Medicine

## 2019-02-19 DIAGNOSIS — Z1231 Encounter for screening mammogram for malignant neoplasm of breast: Secondary | ICD-10-CM

## 2019-03-28 ENCOUNTER — Ambulatory Visit
Admission: RE | Admit: 2019-03-28 | Discharge: 2019-03-28 | Disposition: A | Discharge status: Home / Self Care | Payer: Medicare HMO | Source: Ambulatory Visit | Attending: Internal Medicine | Admitting: Internal Medicine

## 2019-03-28 DIAGNOSIS — Z1231 Encounter for screening mammogram for malignant neoplasm of breast: Secondary | ICD-10-CM | POA: Diagnosis present

## 2019-07-12 DIAGNOSIS — Z79899 Other long term (current) drug therapy: Secondary | ICD-10-CM | POA: Insufficient documentation

## 2019-11-29 ENCOUNTER — Encounter: Payer: Self-pay | Admitting: Podiatry

## 2019-11-29 ENCOUNTER — Ambulatory Visit (INDEPENDENT_AMBULATORY_CARE_PROVIDER_SITE_OTHER): Payer: Medicare HMO | Admitting: Podiatry

## 2019-11-29 ENCOUNTER — Other Ambulatory Visit: Payer: Self-pay

## 2019-11-29 DIAGNOSIS — B351 Tinea unguium: Secondary | ICD-10-CM

## 2019-11-29 DIAGNOSIS — E0842 Diabetes mellitus due to underlying condition with diabetic polyneuropathy: Secondary | ICD-10-CM

## 2019-11-29 DIAGNOSIS — M79676 Pain in unspecified toe(s): Secondary | ICD-10-CM | POA: Diagnosis not present

## 2019-11-29 NOTE — Progress Notes (Addendum)
This patient returns to my office for at risk foot care.  This patient requires this care by a professional since this patient will be at risk due to having chronic kidney disease and type 2 diabetes. This patient is unable to cut nails himself since the patient cannot reach his nails.These nails are painful walking and wearing shoes.  This patient presents for at risk foot care today.  General Appearance  Alert, conversant and in no acute stress.  Vascular  Dorsalis pedis   pulses are weakly  palpable  bilaterally. Posterior tibial pulses are absent  B/L. Capillary return is within normal limits  bilaterally. Temperature is within normal limits  bilaterally.  Neurologic  Senn-Weinstein monofilament wire test diminished   bilaterally. Muscle power within normal limits bilaterally.  Nails Thick disfigured discolored nails with subungual debris  from hallux to fifth toes bilaterally. No evidence of bacterial infection or drainage bilaterally.  Orthopedic  No limitations of motion  feet .  No crepitus or effusions noted.  No bony pathology or digital deformities noted.  Mild  HAV  B/L.  Skin  normotropic skin with no porokeratosis noted bilaterally.  No signs of infections or ulcers noted.     Onychomycosis  Pain in right toes  Pain in left toes  Consent was obtained for treatment procedures.   Mechanical debridement of nails 1-5  bilaterally performed with a nail nipper.  Filed with dremel without incident.    Return office visit   3 months                   Told patient to return for periodic foot care and evaluation due to potential at risk complications.   [place] Gunther DPM

## 2020-01-11 DIAGNOSIS — G250 Essential tremor: Secondary | ICD-10-CM | POA: Insufficient documentation

## 2020-02-18 ENCOUNTER — Other Ambulatory Visit: Payer: Self-pay | Admitting: Internal Medicine

## 2020-02-18 DIAGNOSIS — Z1231 Encounter for screening mammogram for malignant neoplasm of breast: Secondary | ICD-10-CM

## 2020-03-03 ENCOUNTER — Ambulatory Visit (INDEPENDENT_AMBULATORY_CARE_PROVIDER_SITE_OTHER): Payer: Medicare HMO | Admitting: Podiatry

## 2020-03-03 ENCOUNTER — Other Ambulatory Visit: Payer: Self-pay

## 2020-03-03 ENCOUNTER — Encounter: Payer: Self-pay | Admitting: Podiatry

## 2020-03-03 DIAGNOSIS — B351 Tinea unguium: Secondary | ICD-10-CM | POA: Diagnosis not present

## 2020-03-03 DIAGNOSIS — E0842 Diabetes mellitus due to underlying condition with diabetic polyneuropathy: Secondary | ICD-10-CM

## 2020-03-03 DIAGNOSIS — M79676 Pain in unspecified toe(s): Secondary | ICD-10-CM

## 2020-03-03 DIAGNOSIS — N183 Chronic kidney disease, stage 3 unspecified: Secondary | ICD-10-CM | POA: Diagnosis not present

## 2020-03-03 NOTE — Progress Notes (Signed)
This patient returns to my office for at risk foot care.  This patient requires this care by a professional since this patient will be at risk due to having chronic kidney disease and type 2 diabetes.  This patient is unable to cut nails herself since the patient cannot reach her nails.These nails are painful walking and wearing shoes.  This patient presents for at risk foot care today.  General Appearance  Alert, conversant and in no acute stress.  Vascular  Dorsalis pedis   pulses are weakly  palpable  bilaterally. Posterior tibial pulses are absent  B/L. Capillary return is within normal limits  bilaterally. Temperature is within normal limits  bilaterally.  Neurologic  Senn-Weinstein monofilament wire test diminished   bilaterally. Muscle power within normal limits bilaterally.  Nails Thick disfigured discolored nails with subungual debris  from hallux to fifth toes bilaterally. No evidence of bacterial infection or drainage bilaterally.  Orthopedic  No limitations of motion  feet .  No crepitus or effusions noted.  No bony pathology or digital deformities noted.  Mild  HAV  B/L.  Skin  normotropic skin with no porokeratosis noted bilaterally.  No signs of infections or ulcers noted.     Onychomycosis  Pain in right toes  Pain in left toes  Consent was obtained for treatment procedures.   Mechanical debridement of nails 1-5  bilaterally performed with a nail nipper.  Filed with dremel without incident.    Return office visit   3 months                   Told patient to return for periodic foot care and evaluation due to potential at risk complications.   Helane Gunther DPM

## 2020-03-24 ENCOUNTER — Other Ambulatory Visit: Payer: Self-pay

## 2020-03-24 ENCOUNTER — Encounter: Payer: Self-pay | Admitting: Podiatry

## 2020-03-24 ENCOUNTER — Ambulatory Visit (INDEPENDENT_AMBULATORY_CARE_PROVIDER_SITE_OTHER): Payer: Medicare HMO | Admitting: Podiatry

## 2020-03-24 DIAGNOSIS — L608 Other nail disorders: Secondary | ICD-10-CM

## 2020-03-24 NOTE — Progress Notes (Signed)
This patient returns to my office for at risk foot care.  This patient requires this care by a professional since this patient will be at risk due to having kidney disease and diabetes mellitus type 2.   This patient is unable to cut nails himself since the patient cannot reach his nails.These nails are painful walking and wearing shoes. She has throbbing pain  on and off to her outside border left big toe. This patient presents for at risk foot care today.  General Appearance  Alert, conversant and in no acute stress.  Vascular  Dorsalis pedis  are weakly  palpable  bilaterally. Posterior tibial pulses are absent  B/L. Capillary return is within normal limits  bilaterally. Temperature is within normal limits  bilaterally.  Neurologic  Senn-Weinstein monofilament wire test diminished   bilaterally. Muscle power within normal limits bilaterally.  Nails Thick disfigured discolored nails with subungual debris  from hallux to fifth toes bilaterally. No evidence of bacterial infection or drainage bilaterally.  Marked incurvation lateral border left great toenail since the nail is a pincer nail deformity.  Orthopedic  No limitations of motion  feet .  No crepitus or effusions noted.  No bony pathology or digital deformities noted.  Skin  normotropic skin with no porokeratosis noted bilaterally.  No signs of infections or ulcers noted.     Pincer nail left hallux    Pain in right toes  Pain in left toes  Consent was obtained for treatment procedures.   Incision and drainage of the lateral border left hallux nail.   Return office visit  8 weeks                    Told patient to return for periodic foot care and evaluation due to potential at risk complications.   Helane Gunther DPM

## 2020-04-02 ENCOUNTER — Other Ambulatory Visit: Payer: Self-pay

## 2020-04-02 ENCOUNTER — Ambulatory Visit
Admission: RE | Admit: 2020-04-02 | Discharge: 2020-04-02 | Disposition: A | Discharge status: Home / Self Care | Payer: Medicare HMO | Source: Ambulatory Visit | Attending: Internal Medicine | Admitting: Internal Medicine

## 2020-04-02 DIAGNOSIS — Z1231 Encounter for screening mammogram for malignant neoplasm of breast: Secondary | ICD-10-CM

## 2020-06-02 ENCOUNTER — Ambulatory Visit: Payer: Medicare HMO | Admitting: Podiatry

## 2020-06-09 ENCOUNTER — Encounter: Payer: Self-pay | Admitting: Podiatry

## 2020-06-09 ENCOUNTER — Ambulatory Visit (INDEPENDENT_AMBULATORY_CARE_PROVIDER_SITE_OTHER): Payer: Medicare HMO | Admitting: Podiatry

## 2020-06-09 ENCOUNTER — Other Ambulatory Visit: Payer: Self-pay

## 2020-06-09 ENCOUNTER — Ambulatory Visit: Payer: Medicare HMO | Admitting: Podiatry

## 2020-06-09 DIAGNOSIS — M79676 Pain in unspecified toe(s): Secondary | ICD-10-CM

## 2020-06-09 DIAGNOSIS — B351 Tinea unguium: Secondary | ICD-10-CM | POA: Diagnosis not present

## 2020-06-09 DIAGNOSIS — L608 Other nail disorders: Secondary | ICD-10-CM | POA: Diagnosis not present

## 2020-06-09 DIAGNOSIS — E0842 Diabetes mellitus due to underlying condition with diabetic polyneuropathy: Secondary | ICD-10-CM | POA: Diagnosis not present

## 2020-06-09 DIAGNOSIS — N183 Chronic kidney disease, stage 3 unspecified: Secondary | ICD-10-CM

## 2020-06-09 NOTE — Progress Notes (Signed)
This patient returns to my office for at risk foot care.  This patient requires this care by a professional since this patient will be at risk due to having kidney disease and diabetes mellitus type 2.    This patient is unable to cut nails herself since the patient cannot reach her nails.These nails are painful walking and wearing shoes. . This patient presents for at risk foot care today.  General Appearance  Alert, conversant and in no acute stress.  Vascular  Dorsalis pedis  are weakly  palpable  bilaterally. Posterior tibial pulses are absent  B/L. Capillary return is within normal limits  bilaterally. Temperature is within normal limits  bilaterally.  Neurologic  Senn-Weinstein monofilament wire test diminished   bilaterally. Muscle power within normal limits bilaterally.  Nails Thick disfigured discolored nails with subungual debris  from hallux to fifth toes bilaterally. No evidence of bacterial infection or drainage bilaterally.    Orthopedic  No limitations of motion  feet .  No crepitus or effusions noted.  No bony pathology or digital deformities noted.  Skin  normotropic skin with no porokeratosis noted bilaterally.  No signs of infections or ulcers noted.     Onychomycosis  B/L.    Pain in right toes  Pain in left toes  Consent was obtained for treatment procedures.   Debride nail with nail nipper and followed by dremel tool.   Return office visit  12 weeks                    Told patient to return for periodic foot care and evaluation due to potential at risk complications.   Helane Gunther DPM

## 2020-09-25 ENCOUNTER — Other Ambulatory Visit: Payer: Self-pay

## 2020-09-25 ENCOUNTER — Ambulatory Visit (INDEPENDENT_AMBULATORY_CARE_PROVIDER_SITE_OTHER): Payer: Medicare HMO | Admitting: Podiatry

## 2020-09-25 ENCOUNTER — Encounter: Payer: Self-pay | Admitting: Podiatry

## 2020-09-25 DIAGNOSIS — N183 Chronic kidney disease, stage 3 unspecified: Secondary | ICD-10-CM | POA: Diagnosis not present

## 2020-09-25 DIAGNOSIS — E0842 Diabetes mellitus due to underlying condition with diabetic polyneuropathy: Secondary | ICD-10-CM

## 2020-09-25 DIAGNOSIS — B351 Tinea unguium: Secondary | ICD-10-CM

## 2020-09-25 DIAGNOSIS — M79676 Pain in unspecified toe(s): Secondary | ICD-10-CM

## 2020-09-25 DIAGNOSIS — L608 Other nail disorders: Secondary | ICD-10-CM | POA: Diagnosis not present

## 2020-09-25 NOTE — Progress Notes (Signed)
This patient returns to my office for at risk foot care.  This patient requires this care by a professional since this patient will be at risk due to having kidney disease and diabetes mellitus type 2.    This patient is unable to cut nails herself since the patient cannot reach her nails.These nails are painful walking and wearing shoes. . This patient presents for at risk foot care today.  General Appearance  Alert, conversant and in no acute stress.  Vascular  Dorsalis pedis  are weakly  palpable  bilaterally. Posterior tibial pulses are absent  B/L. Capillary return is within normal limits  bilaterally. Temperature is within normal limits  bilaterally.  Neurologic  Senn-Weinstein monofilament wire test diminished   bilaterally. Muscle power within normal limits bilaterally.  Nails Thick disfigured discolored nails with subungual debris  from hallux to fifth toes bilaterally. No evidence of bacterial infection or drainage bilaterally.    Orthopedic  No limitations of motion  feet .  No crepitus or effusions noted.  No bony pathology or digital deformities noted.  Skin  normotropic skin with no porokeratosis noted bilaterally.  No signs of infections or ulcers noted.     Onychomycosis  B/L.    Pain in right toes  Pain in left toes  Consent was obtained for treatment procedures.   Debride nail with nail nipper and followed by dremel tool.   Return office visit  12 weeks                    Told patient to return for periodic foot care and evaluation due to potential at risk complications.   Helane Gunther DPM

## 2020-09-29 ENCOUNTER — Ambulatory Visit: Payer: Medicare HMO | Admitting: Podiatry

## 2021-01-01 ENCOUNTER — Ambulatory Visit (INDEPENDENT_AMBULATORY_CARE_PROVIDER_SITE_OTHER): Payer: Medicare HMO | Admitting: Podiatry

## 2021-01-01 ENCOUNTER — Encounter: Payer: Self-pay | Admitting: Podiatry

## 2021-01-01 ENCOUNTER — Other Ambulatory Visit: Payer: Self-pay

## 2021-01-01 DIAGNOSIS — B351 Tinea unguium: Secondary | ICD-10-CM

## 2021-01-01 DIAGNOSIS — E0842 Diabetes mellitus due to underlying condition with diabetic polyneuropathy: Secondary | ICD-10-CM

## 2021-01-01 DIAGNOSIS — M79676 Pain in unspecified toe(s): Secondary | ICD-10-CM | POA: Diagnosis not present

## 2021-01-01 DIAGNOSIS — N183 Chronic kidney disease, stage 3 unspecified: Secondary | ICD-10-CM

## 2021-01-01 NOTE — Progress Notes (Signed)
This patient returns to my office for at risk foot care.  This patient requires this care by a professional since this patient will be at risk due to having kidney disease and diabetes mellitus type 2.    This patient is unable to cut nails herself since the patient cannot reach her nails.These nails are painful walking and wearing shoes. . This patient presents for at risk foot care today.  General Appearance  Alert, conversant and in no acute stress.  Vascular  Dorsalis pedis  are weakly  palpable  bilaterally. Posterior tibial pulses are absent  B/L. Capillary return is within normal limits  bilaterally. Temperature is within normal limits  bilaterally.  Neurologic  Senn-Weinstein monofilament wire test diminished   bilaterally. Muscle power within normal limits bilaterally.  Nails Thick disfigured discolored nails with subungual debris  from hallux to fifth toes bilaterally. No evidence of bacterial infection or drainage bilaterally.    Orthopedic  No limitations of motion  feet .  No crepitus or effusions noted.  No bony pathology or digital deformities noted. Swelling in legs  B/l.  Skin  normotropic skin with no porokeratosis noted bilaterally.  No signs of infections or ulcers noted.     Onychomycosis  B/L.    Pain in right toes  Pain in left toes  Consent was obtained for treatment procedures.   Debride nail with nail nipper and followed by dremel tool. Swelling in legs  B/L.   Return office visit  12 weeks                    Told patient to return for periodic foot care and evaluation due to potential at risk complications.   Siriyah Ambrosius DPM  

## 2021-03-12 ENCOUNTER — Encounter: Payer: Self-pay | Admitting: Ophthalmology

## 2021-03-31 NOTE — Discharge Instructions (Signed)

## 2021-04-01 ENCOUNTER — Other Ambulatory Visit: Payer: Self-pay

## 2021-04-01 ENCOUNTER — Ambulatory Visit
Admission: RE | Admit: 2021-04-01 | Discharge: 2021-04-01 | Disposition: A | Discharge status: Home / Self Care | Payer: Medicare HMO | Attending: Ophthalmology | Admitting: Ophthalmology

## 2021-04-01 ENCOUNTER — Ambulatory Visit: Payer: Medicare HMO | Admitting: Anesthesiology

## 2021-04-01 ENCOUNTER — Encounter
Admission: RE | Disposition: A | Discharge status: Home / Self Care | Payer: Self-pay | Source: Home / Self Care | Attending: Ophthalmology

## 2021-04-01 DIAGNOSIS — E1136 Type 2 diabetes mellitus with diabetic cataract: Secondary | ICD-10-CM | POA: Diagnosis not present

## 2021-04-01 DIAGNOSIS — H2511 Age-related nuclear cataract, right eye: Secondary | ICD-10-CM | POA: Insufficient documentation

## 2021-04-01 DIAGNOSIS — H401113 Primary open-angle glaucoma, right eye, severe stage: Secondary | ICD-10-CM | POA: Diagnosis not present

## 2021-04-01 HISTORY — PX: CATARACT EXTRACTION W/PHACO: SHX586

## 2021-04-01 LAB — GLUCOSE, CAPILLARY
Glucose-Capillary: 89 mg/dL (ref 70–99)
Glucose-Capillary: 80 mg/dL (ref 70–99)

## 2021-04-01 SURGERY — PHACOEMULSIFICATION, CATARACT, WITH IOL INSERTION
Anesthesia: Monitor Anesthesia Care | Site: Eye | Laterality: Right

## 2021-04-01 MED ORDER — DEXMEDETOMIDINE (PRECEDEX) IN NS 20 MCG/5ML (4 MCG/ML) IV SYRINGE
PREFILLED_SYRINGE | INTRAVENOUS | Status: DC | PRN
  Administered 2021-04-01 (×2): 5 ug via INTRAVENOUS

## 2021-04-01 MED ORDER — ACETAMINOPHEN 160 MG/5ML PO SOLN: 325.0000 mg | ORAL | Status: DC | PRN

## 2021-04-01 MED ORDER — SIGHTPATH DOSE#1 BSS IO SOLN
INTRAOCULAR | Status: DC | PRN
  Administered 2021-04-01: 15 mL

## 2021-04-01 MED ORDER — SIGHTPATH DOSE#1 NA HYALUR & NA CHOND-NA HYALUR IO KIT
PACK | INTRAOCULAR | Status: DC | PRN
  Administered 2021-04-01: 1 via OPHTHALMIC

## 2021-04-01 MED ORDER — ONDANSETRON HCL 4 MG/2ML IJ SOLN: 4.0000 mg | Freq: Once | INTRAMUSCULAR | Status: DC | PRN

## 2021-04-01 MED ORDER — SIGHTPATH DOSE#1 BSS IO SOLN
INTRAOCULAR | Status: DC | PRN
  Administered 2021-04-01: 88 mL via OPHTHALMIC

## 2021-04-01 MED ORDER — CEFUROXIME OPHTHALMIC INJECTION 1 MG/0.1 ML
INJECTION | OPHTHALMIC | Status: DC | PRN
  Administered 2021-04-01: 0.1 mL via INTRACAMERAL

## 2021-04-01 MED ORDER — TETRACAINE HCL 0.5 % OP SOLN
1.0000 [drp] | OPHTHALMIC | Status: DC | PRN
  Administered 2021-04-01 (×3): 1 [drp] via OPHTHALMIC

## 2021-04-01 MED ORDER — SIGHTPATH DOSE#1 BSS IO SOLN
INTRAOCULAR | Status: DC | PRN
  Administered 2021-04-01: 1 mL

## 2021-04-01 MED ORDER — GLYCOPYRROLATE 0.2 MG/ML IJ SOLN
INTRAMUSCULAR | Status: DC | PRN
  Administered 2021-04-01 (×2): .2 mg via INTRAVENOUS

## 2021-04-01 MED ORDER — SIGHTPATH DOSE#1 NA CHONDROIT SULF-NA HYALURON 20-15 MG/0.5ML IO SOSY
INTRAOCULAR | Status: DC | PRN
  Administered 2021-04-01: .5 mL via INTRAOCULAR

## 2021-04-01 MED ORDER — ARMC OPHTHALMIC DILATING DROPS
1.0000 "application " | OPHTHALMIC | Status: DC | PRN
  Administered 2021-04-01 (×3): 1 via OPHTHALMIC

## 2021-04-01 MED ORDER — ACETAMINOPHEN 325 MG PO TABS: 325.0000 mg | ORAL_TABLET | ORAL | Status: DC | PRN

## 2021-04-01 SURGICAL SUPPLY — 19 items
BLADE DUAL KAHOOK SINGLE USE (BLADE) ×3 IMPLANT
CANNULA ANT/CHMB 27GA (MISCELLANEOUS) IMPLANT
GLOVE SRG 8 PF TXTR STRL LF DI (GLOVE) ×1 IMPLANT
GLOVE SURG ENC TEXT LTX SZ7.5 (GLOVE) ×3 IMPLANT
GLOVE SURG UNDER POLY LF SZ8 (GLOVE) ×3
GOWN STRL REUS W/ TWL LRG LVL3 (GOWN DISPOSABLE) ×2 IMPLANT
GOWN STRL REUS W/TWL LRG LVL3 (GOWN DISPOSABLE) ×6
ICLIP (OPHTHALMIC RELATED) ×3 IMPLANT
LENS IOL TECNIS EYHANCE 22.5 (Intraocular Lens) ×3 IMPLANT
MARKER SKIN DUAL TIP RULER LAB (MISCELLANEOUS) ×3 IMPLANT
NEEDLE CAPSULORHEX 25GA (NEEDLE) IMPLANT
NEEDLE FILTER BLUNT 18X 1/2SAF (NEEDLE) ×4
NEEDLE FILTER BLUNT 18X1 1/2 (NEEDLE) ×2 IMPLANT
PACK EYE AFTER SURG (MISCELLANEOUS) IMPLANT
RING MALYGIN 7.0 (MISCELLANEOUS) ×3 IMPLANT
SYR 3ML LL SCALE MARK (SYRINGE) ×6 IMPLANT
SYR TB 1ML LUER SLIP (SYRINGE) ×3 IMPLANT
WATER STERILE IRR 250ML POUR (IV SOLUTION) ×3 IMPLANT
WIPE NON LINTING 3.25X3.25 (MISCELLANEOUS) ×3 IMPLANT

## 2021-04-01 NOTE — Transfer of Care (Signed)
Immediate Anesthesia Transfer of Care Note  Patient: Haley Mayer  Procedure(s) Performed: CATARACT EXTRACTION PHACO AND INTRAOCULAR LENS PLACEMENT (IOC) RIGHT DIABETIC [place] DUAL BLADE GOINIOTOMY 10.28 01.35.6 (Right: Eye)  Patient Location: PACU  Anesthesia Type: MAC  Level of Consciousness: awake, alert  and patient cooperative  Airway and Oxygen Therapy: Patient Spontanous Breathing and Patient connected to supplemental oxygen  Post-op Assessment: Post-op Vital signs reviewed, Patient's Cardiovascular Status Stable, Respiratory Function Stable, Patent Airway and No signs of Nausea or vomiting  Post-op Vital Signs: Reviewed and stable  Complications: No notable events documented.

## 2021-04-01 NOTE — Anesthesia Procedure Notes (Signed)
Procedure Name: MAC Date/Time: 04/01/2021 10:32 AM Performed by: Jeannene Patella, CRNA Pre-anesthesia Checklist: Patient identified, Emergency Drugs available, Suction available, Timeout performed and Patient being monitored Patient Re-evaluated:Patient Re-evaluated prior to induction Oxygen Delivery Method: Nasal cannula Placement Confirmation: positive ETCO2

## 2021-04-01 NOTE — Op Note (Signed)
  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 Item Serial No. Manufacturer Lot No. LRB No. Used Action  LENS IOL TECNIS EYHANCE 22.5 - [id number] Intraocular Lens LENS IOL TECNIS EYHANCE 22.5 [id number] JOHNSON   Right 1 Implanted    SURGEON:  Deirdre Evener, MD   ANESTHESIA:  Topical with tetracaine drops augmented with 1% preservative-free intracameral lidocaine.    COMPLICATIONS:  None.   DESCRIPTION OF PROCEDURE:  The patient was identified in the holding room and transported to the operating room and placed in the supine position under the operating microscope.  The right eye was identified as the operative eye and it was prepped and draped in the usual sterile ophthalmic fashion.   A 1 millimeter clear-corneal paracentesis was made at the 12:00 position.  0.5 ml of preservative-free 1% lidocaine was injected into the anterior chamber.  The anterior chamber was filled with Viscoat viscoelastic.  A 2.4 millimeter keratome was used to make a near-clear corneal incision at the 9:00 position. The microscope was adjusted and a gonioprism was used to visulaize the trabecular meshwork.  The [facility] Dual Blade was advanced across the anterior chamber under viscoelastic.  The blade was used to mark the trabecular meshwork at the 1:30 position.  The blade was placed two  clock hours clockwise into the meshwork.  Proper postioning was confirmed.  The blade was passed counterclockwise through the meshwork to excise approximately two to three clock-hours of trabecular meshwork.  A Malyugin ring was inserted to enlarge the 59mm pupil to 7 mm   A curvilinear capsulorrhexis was made with a cystotome and capsulorrhexis forceps.  Balanced salt solution was used to hydrodissect and hydrodelineate the nucleus.   Phacoemulsification was then used in stop and chop fashion to remove the lens nucleus and epinucleus.  The remaining cortex was then removed using the irrigation and aspiration handpiece. Provisc was then placed into the capsular bag to distend it for lens placement.  A lens was then injected into the capsular bag.  The malyugin ring was explanted. The remaining viscoelastic was aspirated.   Wounds were hydrated with balanced salt solution.  The anterior chamber was inflated to a physiologic pressure with balanced salt solution.  No wound leaks were noted. Cefuroxime 0.1 ml of a 10mg /ml solution was injected into the anterior chamber for a dose of 1 mg of intracameral antibiotic at the completion of the case.  The patient was taken to the recovery room in stable condition without complications of anesthesia or surgery.

## 2021-04-01 NOTE — Anesthesia Preprocedure Evaluation (Addendum)
Anesthesia Evaluation  Patient identified by MRN, date of birth, ID band Patient awake    Reviewed: Allergy & Precautions, NPO status   Airway Mallampati: II  TM Distance: >3 FB     Dental   Pulmonary    Pulmonary exam normal        Cardiovascular hypertension,  Rhythm:Regular Rate:Normal  LBBB  HLD   Neuro/Psych    GI/Hepatic   Endo/Other  diabetes, Type 2Obesity - BMI > 30  Renal/GU Renal InsufficiencyRenal disease     Musculoskeletal  (+) Arthritis ,   Abdominal   Peds  Hematology   Anesthesia Other Findings   Reproductive/Obstetrics                             Anesthesia Physical Anesthesia Plan  ASA: 3  Anesthesia Plan: MAC   Post-op Pain Management:    Induction: Intravenous  PONV Risk Score and Plan: TIVA, Midazolam and Treatment may vary due to age or medical condition  Airway Management Planned: Natural Airway and Nasal Cannula  Additional Equipment:   Intra-op Plan:   Post-operative Plan:   Informed Consent: I have reviewed the patients History and Physical, chart, labs and discussed the procedure including the risks, benefits and alternatives for the proposed anesthesia with the patient or authorized representative who has indicated his/her understanding and acceptance.       Plan Discussed with: CRNA  Anesthesia Plan Comments:         Anesthesia Quick Evaluation

## 2021-04-01 NOTE — H&P (Signed)
[facility]   Primary Care Physician:  Sherron Monday, MD Ophthalmologist: Dr. Lockie Mola  Pre-Procedure History & Physical: HPI:  Haley Mayer is a 87 y.o. female here for ophthalmic surgery.   Past Medical History:  Diagnosis Date   Arthritis    Chronic kidney disease    function no 100% per patient    Diabetes mellitus without complication (HCC)    Type II   Glaucoma    Hyperlipidemia    Hypertension     Past Surgical History:  Procedure Laterality Date   ABDOMINAL HYSTERECTOMY     APPENDECTOMY     CHOLECYSTECTOMY     OOPHORECTOMY     PARTIAL KNEE ARTHROPLASTY Left 08/07/2014   PARTIAL KNEE ARTHROPLASTY Left 08/07/2014   Procedure: LEFT MEDIAL PARTIAL KNEE REPLACEMENT ;  Surgeon: Samson Frederic, MD;  Location: [facility] OR;  Service: Orthopedics;  Laterality: Left;   TONSILLECTOMY      Prior to Admission medications   Medication Sig Start Date End Date Taking? Authorizing Provider  acetaminophen (TYLENOL) 500 MG tablet Take by mouth.   Yes [provider]  amLODipine (NORVASC) 10 MG tablet Take by mouth. 01/11/20  Yes [provider]  aspirin EC 81 MG tablet Take 1 tablet (81 mg total) by mouth 2 (two) times daily after a meal. 08/09/14  Yes Swinteck, Arlys John, MD  Calcium Carbonate-Vit D-Min (CALCIUM 1200 PO) Take 1 tablet by mouth every morning.   Yes [provider]  Calcium Carbonate-Vitamin D 600-400 MG-UNIT tablet Take by mouth.   Yes [provider]  cholecalciferol (VITAMIN D) 1000 UNITS tablet Take 1,000 Units by mouth every morning.    Yes [provider]  diphenhydrAMINE (BENADRYL) 25 MG tablet Take 50 mg by mouth every 6 (six) hours as needed for itching or allergies.   Yes [provider]  docusate sodium (COLACE) 100 MG capsule Take 1 capsule (100 mg total) by mouth 2 (two) times daily. 08/09/14  Yes Swinteck, Arlys John, MD  gabapentin (NEURONTIN) 300 MG capsule Take 300 mg by mouth 3 (three) times  daily. 01/12/20  Yes [provider]  glimepiride (AMARYL) 2 MG tablet Take by mouth. 12/17/19  Yes [provider]  latanoprost (XALATAN) 0.005 % ophthalmic solution  05/28/16  Yes [provider]  magnesium oxide (MAG-OX) 400 (241.3 Mg) MG tablet TAKE 1 TABLET (400 MG TOTAL) BY MOUTH 2 (TWO) TIMES DAILY. FOR MAGNESIUM 05/04/16  Yes [provider]  magnesium oxide (MAG-OX) 400 MG tablet Take by mouth. 01/11/20  Yes [provider]  multivitamin-iron-minerals-folic acid (CENTRUM) chewable tablet Chew 1 tablet by mouth every morning.    Yes [provider]  OneTouch Delica Lancets 33G MISC Apply topically. 02/21/20  Yes [provider]  senna (SENOKOT) 8.6 MG TABS tablet Take 2 tablets (17.2 mg total) by mouth at bedtime. 08/09/14  Yes Swinteck, Arlys John, MD  simvastatin (ZOCOR) 10 MG tablet Take 10 mg by mouth at bedtime.  05/03/14  Yes [provider]  valsartan-hydrochlorothiazide (DIOVAN-HCT) 320-12.5 MG tablet Take by mouth. 11/12/16 04/01/21 Yes [provider]  bimatoprost (LUMIGAN) 0.01 % SOLN Place 1 drop into both eyes at bedtime.    [provider]  [facility] ULTRA test strip 1 each daily. 02/21/20   [provider]    Allergies as of 03/03/2021 - Review Complete 01/01/2021  Allergen Reaction Noted   Aspirin Nausea And Vomiting 07/09/2014   Enalapril maleate Other (See Comments) 06/27/2015   Iodinated diagnostic  agents Other (See Comments) 06/27/2015   Iohexol Nausea And Vomiting 07/09/2014   Loperamide Hives 06/25/2014   Other Other (See Comments) 07/08/2014   Primidone Other (See Comments) 06/25/2014   Sulfa antibiotics Other (See Comments) 10/11/2013   Vasotec [enalapril]  06/25/2014    Family History  Problem Relation Age of Onset   Breast cancer Mother 40    Social History   Socioeconomic History   Marital status: Widowed    Spouse name: Not on file   Number of children: Not on  file   Years of education: Not on file   Highest education level: Not on file  Occupational History   Not on file  Tobacco Use   Smoking status: Never   Smokeless tobacco: Never  Substance and Sexual Activity   Alcohol use: No    Alcohol/week: 0.0 standard drinks   Drug use: No   Sexual activity: Not on file  Other Topics Concern   Not on file  Social History Narrative   Not on file   Social Determinants of Health   Financial Resource Strain: Not on file  Food Insecurity: Not on file  Transportation Needs: Not on file  Physical Activity: Not on file  Stress: Not on file  Social Connections: Not on file  Intimate Partner Violence: Not on file    Review of Systems: See HPI, otherwise negative ROS  Physical Exam: BP (!) 160/58   Pulse (!) 59   Temp (!) 97.5 F (36.4 C) (Temporal)   Ht 5' 1.5" (1.562 m)   Wt 83 kg   SpO2 100%   BMI 34.02 kg/m  General:   Alert,  pleasant and cooperative in NAD Head:  Normocephalic and atraumatic. Lungs:  Clear to auscultation.    Heart:  Regular rate and rhythm.   Impression/Plan: Haley Mayer is here for ophthalmic surgery.  Risks, benefits, limitations, and alternatives regarding ophthalmic surgery have been reviewed with the patient.  Questions have been answered.  All parties agreeable.   Lockie Mola, MD  04/01/2021, 9:53 AM

## 2021-04-01 NOTE — Anesthesia Postprocedure Evaluation (Signed)
Anesthesia Post Note  Patient: Haley Mayer  Procedure(s) Performed: CATARACT EXTRACTION PHACO AND INTRAOCULAR LENS PLACEMENT (IOC) RIGHT DIABETIC [place] DUAL BLADE GOINIOTOMY 10.28 01.35.6 (Right: Eye)     Patient location during evaluation: PACU Anesthesia Type: MAC Level of consciousness: awake Pain management: pain level controlled Vital Signs Assessment: post-procedure vital signs reviewed and stable Respiratory status: respiratory function stable Cardiovascular status: stable Postop Assessment: no apparent nausea or vomiting Anesthetic complications: no   No notable events documented.  Veda Canning

## 2021-04-02 ENCOUNTER — Encounter: Payer: Self-pay | Admitting: Ophthalmology

## 2021-04-02 ENCOUNTER — Ambulatory Visit: Payer: Medicare HMO | Admitting: Podiatry

## 2021-04-02 ENCOUNTER — Other Ambulatory Visit: Payer: Self-pay

## 2021-04-16 ENCOUNTER — Ambulatory Visit (INDEPENDENT_AMBULATORY_CARE_PROVIDER_SITE_OTHER): Payer: Medicare HMO | Admitting: Podiatry

## 2021-04-16 ENCOUNTER — Encounter: Payer: Self-pay | Admitting: Podiatry

## 2021-04-16 ENCOUNTER — Other Ambulatory Visit: Payer: Self-pay

## 2021-04-16 DIAGNOSIS — B351 Tinea unguium: Secondary | ICD-10-CM | POA: Diagnosis not present

## 2021-04-16 DIAGNOSIS — N183 Chronic kidney disease, stage 3 unspecified: Secondary | ICD-10-CM | POA: Diagnosis not present

## 2021-04-16 DIAGNOSIS — L608 Other nail disorders: Secondary | ICD-10-CM | POA: Diagnosis not present

## 2021-04-16 DIAGNOSIS — E0842 Diabetes mellitus due to underlying condition with diabetic polyneuropathy: Secondary | ICD-10-CM | POA: Diagnosis not present

## 2021-04-16 DIAGNOSIS — M79676 Pain in unspecified toe(s): Secondary | ICD-10-CM

## 2021-04-16 NOTE — Progress Notes (Signed)
This patient returns to my office for at risk foot care.  This patient requires this care by a professional since this patient will be at risk due to having kidney disease and diabetes mellitus type 2.    This patient is unable to cut nails herself since the patient cannot reach her nails.These nails are painful walking and wearing shoes. . This patient presents for at risk foot care today.  General Appearance  Alert, conversant and in no acute stress.  Vascular  Dorsalis pedis  are weakly  palpable  bilaterally. Posterior tibial pulses are absent  B/L. Capillary return is within normal limits  bilaterally. Temperature is within normal limits  bilaterally.  Neurologic  Senn-Weinstein monofilament wire test diminished   bilaterally. Muscle power within normal limits bilaterally.  Nails Thick disfigured discolored nails with subungual debris  from hallux to fifth toes bilaterally. No evidence of bacterial infection or drainage bilaterally.    Orthopedic  No limitations of motion  feet .  No crepitus or effusions noted.  No bony pathology or digital deformities noted. Swelling in legs  B/l.  Skin  normotropic skin with no porokeratosis noted bilaterally.  No signs of infections or ulcers noted.     Onychomycosis  B/L.    Pain in right toes  Pain in left toes  Consent was obtained for treatment procedures.   Debride nail with nail nipper and followed by dremel tool. Swelling in legs  B/L.   Return office visit  12 weeks                    Told patient to return for periodic foot care and evaluation due to potential at risk complications.   Sherry Blackard DPM  

## 2021-04-20 NOTE — Discharge Instructions (Signed)

## 2021-04-22 ENCOUNTER — Ambulatory Visit: Payer: Medicare HMO | Admitting: Anesthesiology

## 2021-04-22 ENCOUNTER — Encounter
Admission: RE | Disposition: A | Discharge status: Home / Self Care | Payer: Self-pay | Source: Home / Self Care | Attending: Ophthalmology

## 2021-04-22 ENCOUNTER — Encounter: Payer: Self-pay | Admitting: Ophthalmology

## 2021-04-22 ENCOUNTER — Ambulatory Visit
Admission: RE | Admit: 2021-04-22 | Discharge: 2021-04-22 | Disposition: A | Discharge status: Home / Self Care | Payer: Medicare HMO | Attending: Ophthalmology | Admitting: Ophthalmology

## 2021-04-22 ENCOUNTER — Other Ambulatory Visit: Payer: Self-pay

## 2021-04-22 DIAGNOSIS — E039 Hypothyroidism, unspecified: Secondary | ICD-10-CM | POA: Diagnosis not present

## 2021-04-22 DIAGNOSIS — H401123 Primary open-angle glaucoma, left eye, severe stage: Secondary | ICD-10-CM | POA: Diagnosis not present

## 2021-04-22 DIAGNOSIS — E1136 Type 2 diabetes mellitus with diabetic cataract: Secondary | ICD-10-CM | POA: Diagnosis not present

## 2021-04-22 DIAGNOSIS — I129 Hypertensive chronic kidney disease with stage 1 through stage 4 chronic kidney disease, or unspecified chronic kidney disease: Secondary | ICD-10-CM | POA: Insufficient documentation

## 2021-04-22 DIAGNOSIS — I4430 Unspecified atrioventricular block: Secondary | ICD-10-CM | POA: Insufficient documentation

## 2021-04-22 DIAGNOSIS — H2512 Age-related nuclear cataract, left eye: Secondary | ICD-10-CM | POA: Diagnosis present

## 2021-04-22 DIAGNOSIS — I447 Left bundle-branch block, unspecified: Secondary | ICD-10-CM | POA: Insufficient documentation

## 2021-04-22 DIAGNOSIS — M1990 Unspecified osteoarthritis, unspecified site: Secondary | ICD-10-CM | POA: Diagnosis not present

## 2021-04-22 DIAGNOSIS — E1122 Type 2 diabetes mellitus with diabetic chronic kidney disease: Secondary | ICD-10-CM | POA: Insufficient documentation

## 2021-04-22 DIAGNOSIS — N189 Chronic kidney disease, unspecified: Secondary | ICD-10-CM | POA: Insufficient documentation

## 2021-04-22 DIAGNOSIS — E669 Obesity, unspecified: Secondary | ICD-10-CM | POA: Insufficient documentation

## 2021-04-22 DIAGNOSIS — Z6831 Body mass index (BMI) 31.0-31.9, adult: Secondary | ICD-10-CM | POA: Diagnosis not present

## 2021-04-22 HISTORY — PX: CATARACT EXTRACTION W/PHACO: SHX586

## 2021-04-22 LAB — GLUCOSE, CAPILLARY
Glucose-Capillary: 88 mg/dL (ref 70–99)
Glucose-Capillary: 99 mg/dL (ref 70–99)

## 2021-04-22 SURGERY — PHACOEMULSIFICATION, CATARACT, WITH IOL INSERTION
Anesthesia: Monitor Anesthesia Care | Site: Eye | Laterality: Left

## 2021-04-22 MED ORDER — GLYCOPYRROLATE 0.2 MG/ML IJ SOLN
INTRAMUSCULAR | Status: DC | PRN
  Administered 2021-04-22: .2 mg via INTRAVENOUS

## 2021-04-22 MED ORDER — CEFUROXIME OPHTHALMIC INJECTION 1 MG/0.1 ML
INJECTION | OPHTHALMIC | Status: DC | PRN
  Administered 2021-04-22: 0.1 mL via INTRACAMERAL

## 2021-04-22 MED ORDER — TETRACAINE HCL 0.5 % OP SOLN
1.0000 [drp] | OPHTHALMIC | Status: DC | PRN
  Administered 2021-04-22 (×3): 1 [drp] via OPHTHALMIC

## 2021-04-22 MED ORDER — SIGHTPATH DOSE#1 NA HYALUR & NA CHOND-NA HYALUR IO KIT
PACK | INTRAOCULAR | Status: DC | PRN
  Administered 2021-04-22: 1 via OPHTHALMIC

## 2021-04-22 MED ORDER — DEXMEDETOMIDINE (PRECEDEX) IN NS 20 MCG/5ML (4 MCG/ML) IV SYRINGE
PREFILLED_SYRINGE | INTRAVENOUS | Status: DC | PRN
  Administered 2021-04-22: 10 ug via INTRAVENOUS

## 2021-04-22 MED ORDER — SIGHTPATH DOSE#1 BSS IO SOLN
INTRAOCULAR | Status: DC | PRN
  Administered 2021-04-22: 1 mL

## 2021-04-22 MED ORDER — SIGHTPATH DOSE#1 NA CHONDROIT SULF-NA HYALURON 20-15 MG/0.5ML IO SOSY
INTRAOCULAR | Status: DC | PRN
  Administered 2021-04-22: .5 mL via INTRAOCULAR

## 2021-04-22 MED ORDER — ARMC OPHTHALMIC DILATING DROPS
1.0000 "application " | OPHTHALMIC | Status: DC | PRN
  Administered 2021-04-22 (×3): 1 via OPHTHALMIC

## 2021-04-22 MED ORDER — ONDANSETRON HCL 4 MG/2ML IJ SOLN: 4.0000 mg | Freq: Once | INTRAMUSCULAR | Status: DC | PRN

## 2021-04-22 MED ORDER — ACETAMINOPHEN 10 MG/ML IV SOLN: 1000.0000 mg | Freq: Once | INTRAVENOUS | Status: DC | PRN

## 2021-04-22 MED ORDER — SIGHTPATH DOSE#1 BSS IO SOLN
INTRAOCULAR | Status: DC | PRN
  Administered 2021-04-22: 15 mL

## 2021-04-22 MED ORDER — LACTATED RINGERS IV SOLN: INTRAVENOUS | Status: DC

## 2021-04-22 SURGICAL SUPPLY — 12 items
GLOVE SRG 8 PF TXTR STRL LF DI (GLOVE) ×1 IMPLANT
GLOVE SURG ENC TEXT LTX SZ7.5 (GLOVE) ×2 IMPLANT
GLOVE SURG UNDER POLY LF SZ8 (GLOVE) ×2
ICLIP (OPHTHALMIC RELATED) ×2 IMPLANT
LENS IOL TECNIS EYHANCE 22.5 (Intraocular Lens) ×2 IMPLANT
NEEDLE FILTER BLUNT 18X 1/2SAF (NEEDLE) ×1
NEEDLE FILTER BLUNT 18X1 1/2 (NEEDLE) ×1 IMPLANT
PACK EYE AFTER SURG (MISCELLANEOUS) ×2 IMPLANT
RING MALYGIN 7.0 (MISCELLANEOUS) ×2 IMPLANT
SYR 3ML LL SCALE MARK (SYRINGE) ×2 IMPLANT
SYSTEM OMNI OPHTHALMIC STRL (OPHTHALMIC) ×2 IMPLANT
WATER STERILE IRR 250ML POUR (IV SOLUTION) ×2 IMPLANT

## 2021-04-22 NOTE — Op Note (Signed)
PREOPERATIVE DIAGNOSIS:  Nuclear sclerotic cataract left eye. H25.12  severe stage Primary Open Angle Glaucoma left eye H40.1123  POSTOPERATIVE DIAGNOSIS:    Nuclear sclerotic cataract left eye with miotic pupil   severe stage Primary Open Angle Glaucoma left eye H40.1123  PROCEDURE:  Phacoemusification with posterior chamber intraocular lens placement of the left eye  OMNI canaloplasty and trabelculotomy eft eye.  Malyugin pupil expansion left eye  Ultrasound time: Procedure(s) with comments: CATARACT EXTRACTION PHACO AND INTRAOCULAR LENS PLACEMENT (IOC) LEFT DIABETIC OMNI CANALOPLASTY AND TRABECULOTOMY 14.83 02:02.9 (Left) - leave last case  LENS:  Implant Name Type Inv. Item Serial No. Manufacturer Lot No. LRB No. Used Action  LENS IOL TECNIS EYHANCE 22.5 - [id number] Intraocular Lens LENS IOL TECNIS EYHANCE 22.5 [id number] JOHNSON   Left 1 Implanted    SURGEON:  Deirdre Evener, MD   ANESTHESIA:  Topical with tetracaine drops augmented with 1% preservative-free intracameral lidocaine.    COMPLICATIONS:  None.   DESCRIPTION OF PROCEDURE:  The patient was identified in the holding room and transported to the operating room and placed in the supine position under the operating microscope.  The left eye was identified as the operative eye and it was prepped and draped in the usual sterile ophthalmic fashion.   A 1 millimeter clear-corneal paracentesis was made at the 5:30 position.  0.5 ml of preservative-free 1% lidocaine was injected into the anterior chamber.  The anterior chamber was filled with Viscoat viscoelastic.  A 2.4 millimeter keratome was used to make a near-clear corneal incision at the 2:30 position. A malyugin ring was placed to expand the miotic pupil to 49mm.  A curvilinear capsulorrhexis was made with a cystotome and capsulorrhexis forceps.  Balanced salt solution was used to hydrodissect and hydrodelineate the nucleus.   Phacoemulsification was then used in stop  and chop fashion to remove the lens nucleus and epinucleus.  The remaining cortex was then removed using the irrigation and aspiration handpiece. Provisc was then placed into the capsular bag to distend it for lens placement.  A lens was then injected into the capsular bag.  The Malyugin ring was removed.Additional Viscoat was placed into the eye.  The microscope was adjusted and a gonioprism was used to visulaize the trabecular meshwork.  The Omni device was inserted and advanced toward the trabecular meshwork.  The cannula tip was used to gain access to Schlemm's canal.  The microcatheter was advanced into the superior angle for 180 degrees.  Upon retraction of the microcatheter, a controlled amount of Provisc was delivered into Schlemm's canal.  The cannula was withdrawn from the anterior chamber and additional Viscoat was placed into the eye.  The Omni was reinserted in the opposite direction to perform 180 degree canaloplasty of the inferior 180 degrees as noted above.  The catheter was used to unroof the canal for approximately 60 degrees by gently withdrawing it while exiting the eye.  The remaining viscoelastic was aspirated.   Wounds were hydrated with balanced salt solution.  The anterior chamber was inflated to a physiologic pressure with balanced salt solution.  No wound leaks were noted. Cefuroxime 0.1 ml of a 10mg /ml solution was injected into the anterior chamber for a dose of 1 mg of intracameral antibiotic at the completion of the case.   The patient was taken to the recovery room in stable condition without complications of anesthesia or surgery.

## 2021-04-22 NOTE — Transfer of Care (Signed)
Immediate Anesthesia Transfer of Care Note  Patient: Haley Mayer  Procedure(s) Performed: CATARACT EXTRACTION PHACO AND INTRAOCULAR LENS PLACEMENT (IOC) LEFT DIABETIC OMNI CANALOPLASTY AND TRABECULOTOMY 14.83 02:02.9 (Left: Eye)  Patient Location: PACU  Anesthesia Type: MAC  Level of Consciousness: awake, alert  and patient cooperative  Airway and Oxygen Therapy: Patient Spontanous Breathing and Patient connected to supplemental oxygen  Post-op Assessment: Post-op Vital signs reviewed, Patient's Cardiovascular Status Stable, Respiratory Function Stable, Patent Airway and No signs of Nausea or vomiting  Post-op Vital Signs: Reviewed and stable  Complications: No notable events documented.

## 2021-04-22 NOTE — H&P (Signed)
[facility]   Primary Care Physician:  Sherron Monday, MD Ophthalmologist: Dr. Lockie Mola  Pre-Procedure History & Physical: HPI:  Haley Mayer is a 87 y.o. female here for ophthalmic surgery.   Past Medical History:  Diagnosis Date   Arthritis    Chronic kidney disease    function no 100% per patient    Diabetes mellitus without complication (HCC)    Type II   Glaucoma    Hyperlipidemia    Hypertension     Past Surgical History:  Procedure Laterality Date   ABDOMINAL HYSTERECTOMY     APPENDECTOMY     CATARACT EXTRACTION W/PHACO Right 04/01/2021   Procedure: CATARACT EXTRACTION PHACO AND INTRAOCULAR LENS PLACEMENT (IOC) RIGHT DIABETIC KAHOOK DUAL BLADE GOINIOTOMY 10.28 01.35.6;  Surgeon: Lockie Mola, MD;  Location: [facility];  Service: Ophthalmology;  Laterality: Right;   CHOLECYSTECTOMY     OOPHORECTOMY     PARTIAL KNEE ARTHROPLASTY Left 08/07/2014   PARTIAL KNEE ARTHROPLASTY Left 08/07/2014   Procedure: LEFT MEDIAL PARTIAL KNEE REPLACEMENT ;  Surgeon: Samson Frederic, MD;  Location: [facility] OR;  Service: Orthopedics;  Laterality: Left;   TONSILLECTOMY      Prior to Admission medications   Medication Sig Start Date End Date Taking? Authorizing Provider  acetaminophen (TYLENOL) 500 MG tablet Take by mouth.   Yes [provider]  amLODipine (NORVASC) 10 MG tablet Take by mouth. 01/11/20  Yes [provider]  aspirin EC 81 MG tablet Take 1 tablet (81 mg total) by mouth 2 (two) times daily after a meal. 08/09/14  Yes Swinteck, Arlys John, MD  bimatoprost (LUMIGAN) 0.01 % SOLN Place 1 drop into both eyes at bedtime.   Yes [provider]  Calcium Carbonate-Vit D-Min (CALCIUM 1200 PO) Take 1 tablet by mouth every morning.   Yes [provider]  Calcium Carbonate-Vitamin D 600-400 MG-UNIT tablet Take by mouth.   Yes [provider]  cholecalciferol (VITAMIN D) 1000 UNITS tablet Take 1,000 Units by mouth  every morning.    Yes [provider]  diphenhydrAMINE (BENADRYL) 25 MG tablet Take 50 mg by mouth every 6 (six) hours as needed for itching or allergies.   Yes [provider]  docusate sodium (COLACE) 100 MG capsule Take 1 capsule (100 mg total) by mouth 2 (two) times daily. 08/09/14  Yes Swinteck, Arlys John, MD  gabapentin (NEURONTIN) 300 MG capsule Take 300 mg by mouth 3 (three) times daily. 01/12/20  Yes [provider]  glimepiride (AMARYL) 2 MG tablet Take by mouth. 12/17/19  Yes [provider]  latanoprost (XALATAN) 0.005 % ophthalmic solution  05/28/16  Yes [provider]  magnesium oxide (MAG-OX) 400 (241.3 Mg) MG tablet TAKE 1 TABLET (400 MG TOTAL) BY MOUTH 2 (TWO) TIMES DAILY. FOR MAGNESIUM 05/04/16  Yes [provider]  magnesium oxide (MAG-OX) 400 MG tablet Take by mouth. 01/11/20  Yes [provider]  multivitamin-iron-minerals-folic acid (CENTRUM) chewable tablet Chew 1 tablet by mouth every morning.    Yes [provider]  OneTouch Delica Lancets 33G MISC Apply topically. 02/21/20  Yes [provider]  ONETOUCH ULTRA test strip 1 each daily. 02/21/20  Yes [provider]  senna (SENOKOT) 8.6 MG TABS tablet Take 2 tablets (17.2 mg total) by mouth at bedtime. 08/09/14  Yes Swinteck, Arlys John, MD  simvastatin (ZOCOR) 10 MG tablet Take 10 mg by mouth at bedtime.  05/03/14  Yes [provider]  valsartan-hydrochlorothiazide (DIOVAN-HCT) 320-12.5 MG tablet Take  by mouth. 11/12/16 04/01/21  [provider]    Allergies as of 03/03/2021 - Review Complete 01/01/2021  Allergen Reaction Noted   Aspirin Nausea And Vomiting 07/09/2014   Enalapril maleate Other (See Comments) 06/27/2015   Iodinated diagnostic agents Other (See Comments) 06/27/2015   Iohexol Nausea And Vomiting 07/09/2014   Loperamide Hives 06/25/2014   Other Other (See Comments) 07/08/2014   Primidone Other (See Comments)  06/25/2014   Sulfa antibiotics Other (See Comments) 10/11/2013   Vasotec [enalapril]  06/25/2014    Family History  Problem Relation Age of Onset   Breast cancer Mother 46    Social History   Socioeconomic History   Marital status: Widowed    Spouse name: Not on file   Number of children: Not on file   Years of education: Not on file   Highest education level: Not on file  Occupational History   Not on file  Tobacco Use   Smoking status: Never   Smokeless tobacco: Never  Substance and Sexual Activity   Alcohol use: No    Alcohol/week: 0.0 standard drinks   Drug use: No   Sexual activity: Not on file  Other Topics Concern   Not on file  Social History Narrative   Not on file   Social Determinants of Health   Financial Resource Strain: Not on file  Food Insecurity: Not on file  Transportation Needs: Not on file  Physical Activity: Not on file  Stress: Not on file  Social Connections: Not on file  Intimate Partner Violence: Not on file    Review of Systems: See HPI, otherwise negative ROS  Physical Exam: BP (!) 137/41   Pulse 62   Temp (!) 97 F (36.1 C) (Temporal)   Wt 81.6 kg   SpO2 100%   BMI 33.46 kg/m  General:   Alert,  pleasant and cooperative in NAD Head:  Normocephalic and atraumatic. Lungs:  Clear to auscultation.    Heart:  Regular rate and rhythm.  Impression/Plan: Haley Mayer is here for ophthalmic surgery.  Risks, benefits, limitations, and alternatives regarding ophthalmic surgery have been reviewed with the patient.  Questions have been answered.  All parties agreeable.   Lockie Mola, MD  04/22/2021, 12:22 PM

## 2021-04-22 NOTE — Anesthesia Postprocedure Evaluation (Signed)
Anesthesia Post Note  Patient: Haley Mayer  Procedure(s) Performed: CATARACT EXTRACTION PHACO AND INTRAOCULAR LENS PLACEMENT (IOC) LEFT DIABETIC OMNI CANALOPLASTY AND TRABECULOTOMY 14.83 02:02.9 (Left: Eye)     Patient location during evaluation: PACU Anesthesia Type: MAC Level of consciousness: awake and alert Pain management: pain level controlled Vital Signs Assessment: post-procedure vital signs reviewed and stable Respiratory status: spontaneous breathing, nonlabored ventilation, respiratory function stable and patient connected to nasal cannula oxygen Cardiovascular status: stable and blood pressure returned to baseline Postop Assessment: no apparent nausea or vomiting Anesthetic complications: no   No notable events documented.  Tamiah Dysart A  [place]

## 2021-04-22 NOTE — Anesthesia Preprocedure Evaluation (Addendum)
Anesthesia Evaluation  Patient identified by MRN, date of birth, ID band Patient awake    Reviewed: Allergy & Precautions, NPO status , Patient's Chart, lab work & pertinent test results, reviewed documented beta blocker date and time   History of Anesthesia Complications Negative for: history of anesthetic complications  Airway Mallampati: III  TM Distance: >3 FB Neck ROM: Limited    Dental   Pulmonary Current SmokerPatient did not abstain from smoking.,    breath sounds clear to auscultation       Cardiovascular hypertension, Pt. on medications and Pt. on home beta blockers (-) angina(-) DOE + dysrhythmias (AV block, LBBB)  Rhythm:Regular Rate:Normal   HLD   Neuro/Psych Anxiety    GI/Hepatic neg GERD  ,  Endo/Other  diabetes, Type 2Hypothyroidism   Renal/GU CRFRenal disease     Musculoskeletal  (+) Arthritis ,   Abdominal (+) + obese (BMI 33),   Peds  Hematology   Anesthesia Other Findings   Reproductive/Obstetrics                            Anesthesia Physical Anesthesia Plan  ASA: 3  Anesthesia Plan: MAC   Post-op Pain Management:    Induction: Intravenous  PONV Risk Score and Plan: 2 and TIVA, Midazolam and Treatment may vary due to age or medical condition  Airway Management Planned: Nasal Cannula  Additional Equipment:   Intra-op Plan:   Post-operative Plan:   Informed Consent: I have reviewed the patients History and Physical, chart, labs and discussed the procedure including the risks, benefits and alternatives for the proposed anesthesia with the patient or authorized representative who has indicated his/her understanding and acceptance.       Plan Discussed with: CRNA and Anesthesiologist  Anesthesia Plan Comments:        Anesthesia Quick Evaluation

## 2021-04-23 ENCOUNTER — Encounter: Payer: Self-pay | Admitting: Ophthalmology

## 2021-05-08 ENCOUNTER — Other Ambulatory Visit: Payer: Self-pay | Admitting: Internal Medicine

## 2021-05-08 DIAGNOSIS — G3184 Mild cognitive impairment, so stated: Secondary | ICD-10-CM

## 2021-05-19 ENCOUNTER — Other Ambulatory Visit: Payer: Self-pay | Admitting: Internal Medicine

## 2021-05-19 DIAGNOSIS — Z1231 Encounter for screening mammogram for malignant neoplasm of breast: Secondary | ICD-10-CM

## 2021-06-02 ENCOUNTER — Other Ambulatory Visit: Payer: Self-pay

## 2021-06-02 ENCOUNTER — Ambulatory Visit
Admission: RE | Admit: 2021-06-02 | Discharge: 2021-06-02 | Disposition: A | Discharge status: Home / Self Care | Payer: Medicare Other | Source: Ambulatory Visit | Attending: Internal Medicine | Admitting: Internal Medicine

## 2021-06-02 DIAGNOSIS — G3184 Mild cognitive impairment, so stated: Secondary | ICD-10-CM | POA: Diagnosis not present

## 2021-06-02 DIAGNOSIS — Z91041 Radiographic dye allergy status: Secondary | ICD-10-CM | POA: Insufficient documentation

## 2021-06-23 ENCOUNTER — Ambulatory Visit
Admission: RE | Admit: 2021-06-23 | Discharge: 2021-06-23 | Disposition: A | Discharge status: Home / Self Care | Payer: Medicare Other | Source: Ambulatory Visit | Attending: Internal Medicine | Admitting: Internal Medicine

## 2021-06-23 ENCOUNTER — Other Ambulatory Visit: Payer: Self-pay

## 2021-06-23 DIAGNOSIS — Z1231 Encounter for screening mammogram for malignant neoplasm of breast: Secondary | ICD-10-CM | POA: Insufficient documentation

## 2021-07-16 ENCOUNTER — Ambulatory Visit (INDEPENDENT_AMBULATORY_CARE_PROVIDER_SITE_OTHER): Payer: Medicare Other | Admitting: Podiatry

## 2021-07-16 ENCOUNTER — Other Ambulatory Visit: Payer: Self-pay

## 2021-07-16 ENCOUNTER — Encounter: Payer: Self-pay | Admitting: Podiatry

## 2021-07-16 DIAGNOSIS — B351 Tinea unguium: Secondary | ICD-10-CM

## 2021-07-16 DIAGNOSIS — M79676 Pain in unspecified toe(s): Secondary | ICD-10-CM | POA: Diagnosis not present

## 2021-07-16 DIAGNOSIS — L608 Other nail disorders: Secondary | ICD-10-CM

## 2021-07-16 DIAGNOSIS — E0842 Diabetes mellitus due to underlying condition with diabetic polyneuropathy: Secondary | ICD-10-CM | POA: Diagnosis not present

## 2021-07-16 DIAGNOSIS — N183 Chronic kidney disease, stage 3 unspecified: Secondary | ICD-10-CM

## 2021-07-16 NOTE — Progress Notes (Signed)
This patient returns to my office for at risk foot care.  This patient requires this care by a professional since this patient will be at risk due to having kidney disease and diabetes mellitus type 2.    This patient is unable to cut nails herself since the patient cannot reach her nails.These nails are painful walking and wearing shoes. . This patient presents for at risk foot care today.  General Appearance  Alert, conversant and in no acute stress.  Vascular  Dorsalis pedis  are weakly  palpable  bilaterally. Posterior tibial pulses are absent  B/L. Capillary return is within normal limits  bilaterally. Temperature is within normal limits  bilaterally.  Neurologic  Senn-Weinstein monofilament wire test diminished   bilaterally. Muscle power within normal limits bilaterally.  Nails Thick disfigured discolored nails with subungual debris  from hallux to fifth toes bilaterally. No evidence of bacterial infection or drainage bilaterally.    Orthopedic  No limitations of motion  feet .  No crepitus or effusions noted.  No bony pathology or digital deformities noted. Swelling in legs  B/l.  Skin  normotropic skin with no porokeratosis noted bilaterally.  No signs of infections or ulcers noted.     Onychomycosis  B/L.    Pain in right toes  Pain in left toes  Consent was obtained for treatment procedures.   Debride nail with nail nipper and followed by dremel tool. Swelling in legs  B/L.   Return office visit  12 weeks                    Told patient to return for periodic foot care and evaluation due to potential at risk complications.   Zelia Yzaguirre DPM  

## 2021-10-22 ENCOUNTER — Ambulatory Visit (INDEPENDENT_AMBULATORY_CARE_PROVIDER_SITE_OTHER): Payer: Medicare Other | Admitting: Podiatry

## 2021-10-22 ENCOUNTER — Encounter: Payer: Self-pay | Admitting: Podiatry

## 2021-10-22 DIAGNOSIS — E0842 Diabetes mellitus due to underlying condition with diabetic polyneuropathy: Secondary | ICD-10-CM | POA: Diagnosis not present

## 2021-10-22 DIAGNOSIS — M79676 Pain in unspecified toe(s): Secondary | ICD-10-CM

## 2021-10-22 DIAGNOSIS — B351 Tinea unguium: Secondary | ICD-10-CM

## 2021-10-22 DIAGNOSIS — L608 Other nail disorders: Secondary | ICD-10-CM | POA: Diagnosis not present

## 2021-10-22 DIAGNOSIS — N183 Chronic kidney disease, stage 3 unspecified: Secondary | ICD-10-CM | POA: Diagnosis not present

## 2021-10-22 NOTE — Progress Notes (Signed)
This patient returns to my office for at risk foot care.  This patient requires this care by a professional since this patient will be at risk due to having kidney disease and diabetes mellitus type 2.    This patient is unable to cut nails herself since the patient cannot reach her nails.These nails are painful walking and wearing shoes. . This patient presents for at risk foot care today.  General Appearance  Alert, conversant and in no acute stress.  Vascular  Dorsalis pedis  are weakly  palpable  bilaterally. Posterior tibial pulses are absent  B/L. Capillary return is within normal limits  bilaterally. Temperature is within normal limits  bilaterally.  Neurologic  Senn-Weinstein monofilament wire test diminished   bilaterally. Muscle power within normal limits bilaterally.  Nails Thick disfigured discolored nails with subungual debris  from hallux to fifth toes bilaterally. No evidence of bacterial infection or drainage bilaterally.    Orthopedic  No limitations of motion  feet .  No crepitus or effusions noted.  No bony pathology or digital deformities noted. Swelling in legs  B/l.  Skin  normotropic skin with no porokeratosis noted bilaterally.  No signs of infections or ulcers noted.     Onychomycosis  B/L.    Pain in right toes  Pain in left toes  Consent was obtained for treatment procedures.   Debride nail with nail nipper and followed by dremel tool. Swelling in legs  B/L.   Return office visit  12 weeks                    Told patient to return for periodic foot care and evaluation due to potential at risk complications.   Gardiner Barefoot DPM

## 2021-10-23 ENCOUNTER — Ambulatory Visit
Admission: RE | Admit: 2021-10-23 | Discharge: 2021-10-23 | Disposition: A | Discharge status: Home / Self Care | Payer: Medicare Other | Source: Ambulatory Visit | Attending: Internal Medicine | Admitting: Internal Medicine

## 2021-10-23 ENCOUNTER — Other Ambulatory Visit: Payer: Self-pay | Admitting: Internal Medicine

## 2021-10-23 DIAGNOSIS — M25551 Pain in right hip: Secondary | ICD-10-CM

## 2021-10-23 DIAGNOSIS — M25552 Pain in left hip: Secondary | ICD-10-CM | POA: Insufficient documentation

## 2021-11-04 ENCOUNTER — Other Ambulatory Visit: Payer: Self-pay | Admitting: Physician Assistant

## 2021-11-04 DIAGNOSIS — R251 Tremor, unspecified: Secondary | ICD-10-CM

## 2021-11-04 DIAGNOSIS — R296 Repeated falls: Secondary | ICD-10-CM

## 2021-11-04 DIAGNOSIS — R2689 Other abnormalities of gait and mobility: Secondary | ICD-10-CM

## 2021-11-04 DIAGNOSIS — R413 Other amnesia: Secondary | ICD-10-CM

## 2021-11-18 ENCOUNTER — Ambulatory Visit
Admission: RE | Admit: 2021-11-18 | Discharge: 2021-11-18 | Disposition: A | Discharge status: Home / Self Care | Payer: Medicare Other | Source: Ambulatory Visit | Attending: Physician Assistant | Admitting: Physician Assistant

## 2021-11-18 DIAGNOSIS — R2689 Other abnormalities of gait and mobility: Secondary | ICD-10-CM | POA: Insufficient documentation

## 2021-11-18 DIAGNOSIS — R413 Other amnesia: Secondary | ICD-10-CM | POA: Diagnosis present

## 2021-11-18 DIAGNOSIS — R296 Repeated falls: Secondary | ICD-10-CM | POA: Diagnosis present

## 2021-11-18 DIAGNOSIS — R251 Tremor, unspecified: Secondary | ICD-10-CM | POA: Diagnosis present

## 2022-01-28 ENCOUNTER — Ambulatory Visit (INDEPENDENT_AMBULATORY_CARE_PROVIDER_SITE_OTHER): Payer: Medicare Other | Admitting: Podiatry

## 2022-01-28 ENCOUNTER — Encounter: Payer: Self-pay | Admitting: Podiatry

## 2022-01-28 DIAGNOSIS — B351 Tinea unguium: Secondary | ICD-10-CM | POA: Diagnosis not present

## 2022-01-28 DIAGNOSIS — M79676 Pain in unspecified toe(s): Secondary | ICD-10-CM | POA: Diagnosis not present

## 2022-01-28 DIAGNOSIS — E0842 Diabetes mellitus due to underlying condition with diabetic polyneuropathy: Secondary | ICD-10-CM | POA: Diagnosis not present

## 2022-01-28 DIAGNOSIS — N183 Chronic kidney disease, stage 3 unspecified: Secondary | ICD-10-CM

## 2022-01-28 DIAGNOSIS — L608 Other nail disorders: Secondary | ICD-10-CM

## 2022-01-28 NOTE — Progress Notes (Signed)
This patient returns to my office for at risk foot care.  This patient requires this care by a professional since this patient will be at risk due to having kidney disease and diabetes mellitus type 2.    This patient is unable to cut nails herself since the patient cannot reach her nails.These nails are painful walking and wearing shoes. . This patient presents for at risk foot care today.  General Appearance  Alert, conversant and in no acute stress.  Vascular  Dorsalis pedis  are weakly  palpable  bilaterally. Posterior tibial pulses are absent  B/L. Capillary return is within normal limits  bilaterally. Temperature is within normal limits  bilaterally.  Neurologic  Senn-Weinstein monofilament wire test diminished   bilaterally. Muscle power within normal limits bilaterally.  Nails Thick disfigured discolored nails with subungual debris  from hallux to fifth toes bilaterally. No evidence of bacterial infection or drainage bilaterally.    Orthopedic  No limitations of motion  feet .  No crepitus or effusions noted.  No bony pathology or digital deformities noted. Swelling in legs  B/l.  Skin  normotropic skin with no porokeratosis noted bilaterally.  No signs of infections or ulcers noted.     Onychomycosis  B/L.    Pain in right toes  Pain in left toes  Consent was obtained for treatment procedures.   Debride nail with nail nipper and followed by dremel tool. Swelling in legs  B/L.   Return office visit  12 weeks                    Told patient to return for periodic foot care and evaluation due to potential at risk complications.   Gardiner Barefoot DPM

## 2022-04-29 ENCOUNTER — Encounter: Payer: Self-pay | Admitting: Podiatry

## 2022-04-29 ENCOUNTER — Ambulatory Visit (INDEPENDENT_AMBULATORY_CARE_PROVIDER_SITE_OTHER): Payer: Medicare Other | Admitting: Podiatry

## 2022-04-29 DIAGNOSIS — M79676 Pain in unspecified toe(s): Secondary | ICD-10-CM

## 2022-04-29 DIAGNOSIS — L608 Other nail disorders: Secondary | ICD-10-CM | POA: Diagnosis not present

## 2022-04-29 DIAGNOSIS — B351 Tinea unguium: Secondary | ICD-10-CM | POA: Diagnosis not present

## 2022-04-29 DIAGNOSIS — N183 Chronic kidney disease, stage 3 unspecified: Secondary | ICD-10-CM

## 2022-04-29 DIAGNOSIS — E0842 Diabetes mellitus due to underlying condition with diabetic polyneuropathy: Secondary | ICD-10-CM | POA: Diagnosis not present

## 2022-04-29 NOTE — Progress Notes (Signed)
This patient returns to my office for at risk foot care.  This patient requires this care by a professional since this patient will be at risk due to having kidney disease and diabetes mellitus type 2.    This patient is unable to cut nails herself since the patient cannot reach her nails.These nails are painful walking and wearing shoes. . This patient presents for at risk foot care today.  General Appearance  Alert, conversant and in no acute stress.  Vascular  Dorsalis pedis  are weakly  palpable  bilaterally. Posterior tibial pulses are absent  B/L. Capillary return is within normal limits  bilaterally. Temperature is within normal limits  bilaterally.  Neurologic  Senn-Weinstein monofilament wire test diminished   bilaterally. Muscle power within normal limits bilaterally.  Nails Thick disfigured discolored nails with subungual debris  from hallux to fifth toes bilaterally. No evidence of bacterial infection or drainage bilaterally.    Orthopedic  No limitations of motion  feet .  No crepitus or effusions noted.  No bony pathology or digital deformities noted. Swelling in legs  B/l.  Skin  normotropic skin with no porokeratosis noted bilaterally.  No signs of infections or ulcers noted.     Onychomycosis  B/L.    Pain in right toes  Pain in left toes  Consent was obtained for treatment procedures.   Debride nail with nail nipper and followed by dremel tool. Swelling in legs  B/L.   Return office visit  12 weeks                    Told patient to return for periodic foot care and evaluation due to potential at risk complications.   Gardiner Barefoot DPM

## 2022-06-01 ENCOUNTER — Other Ambulatory Visit: Payer: Self-pay | Admitting: Internal Medicine

## 2022-06-01 DIAGNOSIS — Z1231 Encounter for screening mammogram for malignant neoplasm of breast: Secondary | ICD-10-CM

## 2022-06-24 ENCOUNTER — Ambulatory Visit
Admission: RE | Admit: 2022-06-24 | Discharge: 2022-06-24 | Disposition: A | Discharge status: Home / Self Care | Payer: Medicare Other | Source: Ambulatory Visit | Attending: Internal Medicine | Admitting: Internal Medicine

## 2022-06-24 DIAGNOSIS — Z1231 Encounter for screening mammogram for malignant neoplasm of breast: Secondary | ICD-10-CM

## 2022-06-29 ENCOUNTER — Other Ambulatory Visit: Payer: Self-pay | Admitting: Internal Medicine

## 2022-06-29 ENCOUNTER — Other Ambulatory Visit: Payer: Medicare Other

## 2022-06-30 LAB — COMPREHENSIVE METABOLIC PANEL
ALT: 14 IU/L (ref 0–32)
AST: 19 IU/L (ref 0–40)
Albumin/Globulin Ratio: 1.2 (ref 1.2–2.2)
Albumin: 3.9 g/dL (ref 3.6–4.6)
Alkaline Phosphatase: 62 IU/L (ref 44–121)
BUN/Creatinine Ratio: 20 (ref 12–28)
BUN: 30 mg/dL (ref 10–36)
Bilirubin Total: 0.6 mg/dL (ref 0.0–1.2)
CO2: 24 mmol/L (ref 20–29)
Calcium: 9.6 mg/dL (ref 8.7–10.3)
Chloride: 100 mmol/L (ref 96–106)
Creatinine, Ser: 1.47 mg/dL — ABNORMAL HIGH (ref 0.57–1.00)
Globulin, Total: 3.3 g/dL (ref 1.5–4.5)
Glucose: 97 mg/dL (ref 70–99)
Potassium: 4.8 mmol/L (ref 3.5–5.2)
Sodium: 140 mmol/L (ref 134–144)
Total Protein: 7.2 g/dL (ref 6.0–8.5)
eGFR: 34 mL/min/{1.73_m2} — ABNORMAL LOW (ref >59)

## 2022-06-30 LAB — LIPID PANEL W/O CHOL/HDL RATIO
Cholesterol, Total: 133 mg/dL (ref 100–199)
HDL: 51 mg/dL (ref >39)
LDL Chol Calc (NIH): 69 mg/dL (ref 0–99)
Triglycerides: 61 mg/dL (ref 0–149)
VLDL Cholesterol Cal: 13 mg/dL (ref 5–40)

## 2022-06-30 LAB — CBC WITH DIFFERENTIAL/PLATELET
Basophils Absolute: 0 10*3/uL (ref 0.0–0.2)
Basos: 1 %
EOS (ABSOLUTE): 0.1 10*3/uL (ref 0.0–0.4)
Eos: 2 %
Hematocrit: 39.3 % (ref 34.0–46.6)
Hemoglobin: 13 g/dL (ref 11.1–15.9)
Immature Grans (Abs): 0 10*3/uL (ref 0.0–0.1)
Immature Granulocytes: 0 %
Lymphocytes Absolute: 1.6 10*3/uL (ref 0.7–3.1)
Lymphs: 41 %
MCH: 31.2 pg (ref 26.6–33.0)
MCHC: 33.1 g/dL (ref 31.5–35.7)
MCV: 94 fL (ref 79–97)
Monocytes Absolute: 0.5 10*3/uL (ref 0.1–0.9)
Monocytes: 12 %
Neutrophils Absolute: 1.8 10*3/uL (ref 1.4–7.0)
Neutrophils: 44 %
Platelets: 138 10*3/uL — ABNORMAL LOW (ref 150–450)
RBC: 4.17 x10E6/uL (ref 3.77–5.28)
RDW: 13.9 % (ref 11.7–15.4)
WBC: 3.9 10*3/uL (ref 3.4–10.8)

## 2022-06-30 LAB — HGB A1C W/O EAG: Hgb A1c MFr Bld: 6.3 % — ABNORMAL HIGH (ref 4.8–5.6)

## 2022-07-01 ENCOUNTER — Other Ambulatory Visit: Payer: Self-pay | Admitting: Internal Medicine

## 2022-07-01 ENCOUNTER — Other Ambulatory Visit: Payer: Self-pay

## 2022-07-02 ENCOUNTER — Other Ambulatory Visit: Payer: Self-pay | Admitting: Internal Medicine

## 2022-07-02 ENCOUNTER — Ambulatory Visit (INDEPENDENT_AMBULATORY_CARE_PROVIDER_SITE_OTHER): Payer: Medicare Other | Admitting: Internal Medicine

## 2022-07-02 ENCOUNTER — Encounter: Payer: Self-pay | Admitting: Internal Medicine

## 2022-07-02 VITALS — BP 138/70 | HR 81 | Ht 61.5 in | Wt 169.4 lb

## 2022-07-02 DIAGNOSIS — I10 Essential (primary) hypertension: Secondary | ICD-10-CM | POA: Diagnosis not present

## 2022-07-02 DIAGNOSIS — G250 Essential tremor: Secondary | ICD-10-CM

## 2022-07-02 DIAGNOSIS — Z0001 Encounter for general adult medical examination with abnormal findings: Secondary | ICD-10-CM | POA: Diagnosis not present

## 2022-07-02 DIAGNOSIS — E1169 Type 2 diabetes mellitus with other specified complication: Secondary | ICD-10-CM | POA: Diagnosis not present

## 2022-07-02 DIAGNOSIS — Z1331 Encounter for screening for depression: Secondary | ICD-10-CM | POA: Diagnosis not present

## 2022-07-02 DIAGNOSIS — N1832 Chronic kidney disease, stage 3b: Secondary | ICD-10-CM

## 2022-07-02 DIAGNOSIS — E785 Hyperlipidemia, unspecified: Secondary | ICD-10-CM

## 2022-07-02 DIAGNOSIS — E119 Type 2 diabetes mellitus without complications: Secondary | ICD-10-CM

## 2022-07-02 DIAGNOSIS — R4181 Age-related cognitive decline: Secondary | ICD-10-CM | POA: Diagnosis not present

## 2022-07-02 LAB — GLUCOSE, POCT (MANUAL RESULT ENTRY): POC Glucose: 137 mg/dl — AB (ref 70–99)

## 2022-07-02 MED ORDER — DIVALPROEX SODIUM 125 MG PO DR TAB: 125.0000 mg | DELAYED_RELEASE_TABLET | Freq: Every day | ORAL | 3 refills | Status: DC

## 2022-07-02 NOTE — Progress Notes (Signed)
Established Patient Office Visit  Subjective:  Patient ID: Haley Mayer, female    DOB: 11/02/1929  Age: 89 y.o. MRN: [id number]  Chief Complaint  Patient presents with   Follow-up    AWV/3 month follow up    No new complaints, here for AWV and refer to scanned documents. Labs reviewed and notable for slight deterioration in GFR but well controlled diabetes while LDL is at target. C/o insomnia with difficulty staying in asleep.     Past Medical History:  Diagnosis Date   Arthritis    Chronic kidney disease    function no 100% per patient    Diabetes mellitus without complication ([employer])    Type II   Glaucoma    Hyperlipidemia    Hypertension     Social History   Socioeconomic History   Marital status: Widowed    Spouse name: Not on file   Number of children: Not on file   Years of education: Not on file   Highest education level: Not on file  Occupational History   Not on file  Tobacco Use   Smoking status: Never   Smokeless tobacco: Never  Substance and Sexual Activity   Alcohol use: No    Alcohol/week: 0.0 standard drinks of alcohol   Drug use: No   Sexual activity: Not on file  Other Topics Concern   Not on file  Social History Narrative   Not on file   Social Determinants of Health   Financial Resource Strain: Not on file  Food Insecurity: Not on file  Transportation Needs: Not on file  Physical Activity: Not on file  Stress: Not on file  Social Connections: Not on file  Intimate Partner Violence: Not on file    Family History  Problem Relation Age of Onset   Breast cancer Mother 40    Allergies  Allergen Reactions   Aspirin Nausea And Vomiting    REGULAR STRENGTH= Also upset stomach    Enalapril Maleate Other (See Comments)    angioedema   Iodinated Contrast Media Other (See Comments)    Vomiting and hives   Iohexol Nausea And Vomiting   Loperamide Hives   Other Other (See Comments)    Tomatoes--acid   Primidone Other (See  Comments)    unknown unknown   Sulfa Antibiotics Other (See Comments)    Vomiting and hives   Vasotec [Enalapril]     unknown    Review of Systems  Constitutional: Negative.   HENT: Negative.    Eyes: Negative.   Respiratory: Negative.    Cardiovascular: Negative.   Gastrointestinal: Negative.   Genitourinary: Negative.   Musculoskeletal:        Left shoulder pain that is exacerbated by lying on that side.  Skin: Negative.   Neurological: Negative.   Endo/Heme/Allergies: Negative.   Psychiatric/Behavioral:  The patient has insomnia.        Objective:   BP 138/70   Pulse 81   Ht 5' 1.5" (1.562 m)   Wt 169 lb 6.4 oz (76.8 kg)   SpO2 97%   BMI 31.49 kg/m   Vitals:   07/02/22 1402  BP: 138/70  Pulse: 81  Height: 5' 1.5" (1.562 m)  Weight: 169 lb 6.4 oz (76.8 kg)  SpO2: 97%  BMI (Calculated): 31.49    Physical Exam Vitals reviewed.  Constitutional:      General: She is not in acute distress. HENT:     Head: Normocephalic.     Nose:  Nose normal.     Mouth/Throat:     Mouth: Mucous membranes are moist.  Eyes:     Extraocular Movements: Extraocular movements intact.     Pupils: Pupils are equal, round, and reactive to light.  Cardiovascular:     Rate and Rhythm: Normal rate and regular rhythm.     Heart sounds: No murmur heard. Pulmonary:     Effort: Pulmonary effort is normal.     Breath sounds: No rhonchi or rales.  Abdominal:     General: Abdomen is flat.     Palpations: There is no hepatomegaly, splenomegaly or mass.  Musculoskeletal:        General: Normal range of motion.     Cervical back: Normal range of motion. No tenderness.  Skin:    General: Skin is warm and dry.  Neurological:     General: No focal deficit present.     Mental Status: She is alert and oriented to person, place, and time.     Cranial Nerves: No cranial nerve deficit.     Motor: No weakness.  Psychiatric:        Mood and Affect: Mood normal.        Behavior: Behavior  normal.      Results for orders placed or performed in visit on 07/02/22  POCT Glucose (CBG)  Result Value Ref Range   POC Glucose 137 (A) 70 - 99 mg/dl    Recent Results (from the past 2160 hour(Ellyana Crigler))  CBC with Differential/Platelet     Status: Abnormal   Collection Time: 06/29/22  9:42 AM  Result Value Ref Range   WBC 3.9 3.4 - 10.8 x10E3/uL   RBC 4.17 3.77 - 5.28 x10E6/uL   Hemoglobin 13.0 11.1 - 15.9 g/dL   Hematocrit 39.3 34.0 - 46.6 %   MCV 94 79 - 97 fL   MCH 31.2 26.6 - 33.0 pg   MCHC 33.1 31.5 - 35.7 g/dL   RDW 13.9 11.7 - 15.4 %   Platelets 138 (L) 150 - 450 x10E3/uL   Neutrophils 44 Not Estab. %   Lymphs 41 Not Estab. %   Monocytes 12 Not Estab. %   Eos 2 Not Estab. %   Basos 1 Not Estab. %   Neutrophils Absolute 1.8 1.4 - 7.0 x10E3/uL   Lymphocytes Absolute 1.6 0.7 - 3.1 x10E3/uL   Monocytes Absolute 0.5 0.1 - 0.9 x10E3/uL   EOS (ABSOLUTE) 0.1 0.0 - 0.4 x10E3/uL   Basophils Absolute 0.0 0.0 - 0.2 x10E3/uL   Immature Granulocytes 0 Not Estab. %   Immature Grans (Abs) 0.0 0.0 - 0.1 x10E3/uL  Comprehensive metabolic panel     Status: Abnormal   Collection Time: 06/29/22  9:42 AM  Result Value Ref Range   Glucose 97 70 - 99 mg/dL   BUN 30 10 - 36 mg/dL   Creatinine, Ser 1.47 (H) 0.57 - 1.00 mg/dL   eGFR 34 (L) >59 mL/min/1.73   BUN/Creatinine Ratio 20 12 - 28   Sodium 140 134 - 144 mmol/L   Potassium 4.8 3.5 - 5.2 mmol/L   Chloride 100 96 - 106 mmol/L   CO2 24 20 - 29 mmol/L   Calcium 9.6 8.7 - 10.3 mg/dL   Total Protein 7.2 6.0 - 8.5 g/dL   Albumin 3.9 3.6 - 4.6 g/dL   Globulin, Total 3.3 1.5 - 4.5 g/dL   Albumin/Globulin Ratio 1.2 1.2 - 2.2   Bilirubin Total 0.6 0.0 - 1.2 mg/dL   Alkaline Phosphatase  62 44 - 121 IU/L   AST 19 0 - 40 IU/L   ALT 14 0 - 32 IU/L  Lipid Panel w/o Chol/HDL Ratio     Status: None   Collection Time: 06/29/22  9:42 AM  Result Value Ref Range   Cholesterol, Total 133 100 - 199 mg/dL   Triglycerides 61 0 - 149 mg/dL   HDL 51  >39 mg/dL   VLDL Cholesterol Cal 13 5 - 40 mg/dL   LDL Chol Calc (NIH) 69 0 - 99 mg/dL  Hgb A1c w/o eAG     Status: Abnormal   Collection Time: 06/29/22  9:42 AM  Result Value Ref Range   Hgb A1c MFr Bld 6.3 (H) 4.8 - 5.6 %    Comment:          Prediabetes: 5.7 - 6.4          Diabetes: >6.4          Glycemic control for adults with diabetes: <7.0   POCT Glucose (CBG)     Status: Abnormal   Collection Time: 07/02/22  2:19 PM  Result Value Ref Range   POC Glucose 137 (A) 70 - 99 mg/dl      Assessment & Plan:   Problem List Items Addressed This Visit   None Visit Diagnoses     Type 2 diabetes mellitus without complication, without long-term current use of insulin (HCC)    -  Primary   Relevant Medications   INVOKANA 100 MG TABS tablet   Other Relevant Orders   POCT Glucose (CBG) (Completed)     1. Type 2 diabetes mellitus without complication, without long-term current use of insulin (HCC) - POCT Glucose (CBG) - Hemoglobin A1c; Future  2. Stage 3b chronic kidney disease ([place])  3. Essential hypertension - Comprehensive metabolic panel  4. Hyperlipidemia associated with type 2 diabetes mellitus ([place]) - Lipid panel; Future  5. Benign essential tremor    No follow-ups on file.   Total time spent: 35 minutes  Volanda Napoleon, MD  07/02/2022

## 2022-08-05 ENCOUNTER — Ambulatory Visit (INDEPENDENT_AMBULATORY_CARE_PROVIDER_SITE_OTHER): Payer: Medicare Other | Admitting: Podiatry

## 2022-08-05 ENCOUNTER — Encounter: Payer: Self-pay | Admitting: Podiatry

## 2022-08-05 VITALS — BP 140/57 | HR 68

## 2022-08-05 DIAGNOSIS — B351 Tinea unguium: Secondary | ICD-10-CM

## 2022-08-05 DIAGNOSIS — M79676 Pain in unspecified toe(s): Secondary | ICD-10-CM | POA: Diagnosis not present

## 2022-08-05 DIAGNOSIS — E0842 Diabetes mellitus due to underlying condition with diabetic polyneuropathy: Secondary | ICD-10-CM

## 2022-08-05 NOTE — Progress Notes (Signed)
This patient returns to my office for at risk foot care.  This patient requires this care by a professional since this patient will be at risk due to having kidney disease and diabetes mellitus type 2.    This patient is unable to cut nails herself since the patient cannot reach her nails.These nails are painful walking and wearing shoes. . This patient presents for at risk foot care today.  General Appearance  Alert, conversant and in no acute stress.  Vascular  Dorsalis pedis  are weakly  palpable  bilaterally. Posterior tibial pulses are absent  B/L. Capillary return is within normal limits  bilaterally. Temperature is within normal limits  bilaterally.  Neurologic  Senn-Weinstein monofilament wire test diminished   bilaterally. Muscle power within normal limits bilaterally.  Nails Thick disfigured discolored nails with subungual debris  from hallux to fifth toes bilaterally. No evidence of bacterial infection or drainage bilaterally.    Orthopedic  No limitations of motion  feet .  No crepitus or effusions noted.  No bony pathology or digital deformities noted. Swelling in legs  B/l.  Skin  normotropic skin with no porokeratosis noted bilaterally.  No signs of infections or ulcers noted.     Onychomycosis  B/L.    Pain in right toes  Pain in left toes  Consent was obtained for treatment procedures.   Debride nail with nail nipper and followed by dremel tool. Swelling in legs  B/L.   Return office visit  12 weeks                    Told patient to return for periodic foot care and evaluation due to potential at risk complications.   Gardiner Barefoot DPM

## 2022-08-24 ENCOUNTER — Other Ambulatory Visit: Payer: Self-pay | Admitting: Internal Medicine

## 2022-08-25 ENCOUNTER — Other Ambulatory Visit: Payer: Self-pay | Admitting: Internal Medicine

## 2022-10-01 ENCOUNTER — Ambulatory Visit (INDEPENDENT_AMBULATORY_CARE_PROVIDER_SITE_OTHER): Payer: Medicare Other | Admitting: Internal Medicine

## 2022-10-01 ENCOUNTER — Encounter: Payer: Self-pay | Admitting: Internal Medicine

## 2022-10-01 VITALS — BP 124/84 | Ht 62.0 in | Wt 166.6 lb

## 2022-10-01 DIAGNOSIS — E1169 Type 2 diabetes mellitus with other specified complication: Secondary | ICD-10-CM | POA: Diagnosis not present

## 2022-10-01 DIAGNOSIS — I10 Essential (primary) hypertension: Secondary | ICD-10-CM

## 2022-10-01 DIAGNOSIS — N1832 Chronic kidney disease, stage 3b: Secondary | ICD-10-CM | POA: Diagnosis not present

## 2022-10-01 DIAGNOSIS — E785 Hyperlipidemia, unspecified: Secondary | ICD-10-CM

## 2022-10-01 DIAGNOSIS — E119 Type 2 diabetes mellitus without complications: Secondary | ICD-10-CM

## 2022-10-01 NOTE — Progress Notes (Signed)
Established Patient Office Visit  Subjective:  Patient ID: Haley Mayer, female    DOB: 01/01/1930  Age: 89 y.o. MRN: [id number]  Chief Complaint  Patient presents with   Follow-up    3 month follow up, discuss lab results.    No new complaints, here for lab review and medication refills. Failed to have previsit labs done. Home bg readings have been well controlled overall.    No other concerns at this time.   Past Medical History:  Diagnosis Date   Arthritis    Chronic kidney disease    function no 100% per patient    Diabetes mellitus without complication (HCC)    Type II   Glaucoma    Hyperlipidemia    Hypertension     Past Surgical History:  Procedure Laterality Date   ABDOMINAL HYSTERECTOMY     APPENDECTOMY     CATARACT EXTRACTION W/PHACO Right 04/01/2021   Procedure: CATARACT EXTRACTION PHACO AND INTRAOCULAR LENS PLACEMENT (IOC) RIGHT DIABETIC KAHOOK DUAL BLADE GOINIOTOMY 10.28 01.35.6;  Surgeon: Lockie Mola, MD;  Location: [facility];  Service: Ophthalmology;  Laterality: Right;   CATARACT EXTRACTION W/PHACO Left 04/22/2021   Procedure: CATARACT EXTRACTION PHACO AND INTRAOCULAR LENS PLACEMENT (IOC) LEFT DIABETIC OMNI CANALOPLASTY AND TRABECULOTOMY 14.83 02:02.9;  Surgeon: Lockie Mola, MD;  Location: [facility];  Service: Ophthalmology;  Laterality: Left;  leave last case   CHOLECYSTECTOMY     OOPHORECTOMY     PARTIAL KNEE ARTHROPLASTY Left 08/07/2014   PARTIAL KNEE ARTHROPLASTY Left 08/07/2014   Procedure: LEFT MEDIAL PARTIAL KNEE REPLACEMENT ;  Surgeon: Samson Frederic, MD;  Location: [facility];  Service: Orthopedics;  Laterality: Left;   TONSILLECTOMY      Social History   Socioeconomic History   Marital status: Widowed    Spouse name: Not on file   Number of children: Not on file   Years of education: Not on file   Highest education level: Not on file  Occupational History   Not on file  Tobacco Use   Smoking status:  Never   Smokeless tobacco: Never  Substance and Sexual Activity   Alcohol use: No    Alcohol/week: 0.0 standard drinks of alcohol   Drug use: No   Sexual activity: Not on file  Other Topics Concern   Not on file  Social History Narrative   Not on file   Social Determinants of Health   Financial Resource Strain: Not on file  Food Insecurity: Not on file  Transportation Needs: Not on file  Physical Activity: Not on file  Stress: Not on file  Social Connections: Not on file  Intimate Partner Violence: Not on file    Family History  Problem Relation Age of Onset   Breast cancer Mother 91    Allergies  Allergen Reactions   Aspirin Nausea And Vomiting    REGULAR STRENGTH= Also upset stomach    Enalapril Maleate Other (See Comments)    angioedema   Iodinated Contrast Media Other (See Comments)    Vomiting and hives   Iohexol Nausea And Vomiting   Loperamide Hives   Other Other (See Comments)    Tomatoes--acid   Primidone Other (See Comments)    unknown unknown   Sulfa Antibiotics Other (See Comments)    Vomiting and hives   Vasotec [Enalapril]     unknown    Review of Systems  Constitutional: Negative.   HENT: Negative.    Eyes: Negative.   Respiratory: Negative.  Cardiovascular: Negative.   Gastrointestinal: Negative.   Genitourinary: Negative.   Musculoskeletal:        Left shoulder pain that is exacerbated by lying on that side.  Skin: Negative.   Neurological: Negative.   Endo/Heme/Allergies: Negative.   Psychiatric/Behavioral:  The patient has insomnia.        Objective:   BP 124/84   Ht 5\' 2"  (1.575 m)   Wt 166 lb 9.6 oz (75.6 kg)   BMI 30.47 kg/m   Vitals:   10/01/22 1134  BP: 124/84  Height: 5\' 2"  (1.575 m)  Weight: 166 lb 9.6 oz (75.6 kg)  BMI (Calculated): 30.46    Physical Exam Vitals reviewed.  Constitutional:      General: She is not in acute distress. HENT:     Head: Normocephalic.     Nose: Nose normal.      Mouth/Throat:     Mouth: Mucous membranes are moist.  Eyes:     Extraocular Movements: Extraocular movements intact.     Pupils: Pupils are equal, round, and reactive to light.  Cardiovascular:     Rate and Rhythm: Normal rate and regular rhythm.     Heart sounds: No murmur heard. Pulmonary:     Effort: Pulmonary effort is normal.     Breath sounds: No rhonchi or rales.  Abdominal:     General: Abdomen is flat.     Palpations: There is no hepatomegaly, splenomegaly or mass.  Musculoskeletal:        General: Normal range of motion.     Cervical back: Normal range of motion. No tenderness.  Skin:    General: Skin is warm and dry.  Neurological:     General: No focal deficit present.     Mental Status: She is alert and oriented to person, place, and time.     Cranial Nerves: No cranial nerve deficit.     Motor: No weakness.  Psychiatric:        Mood and Affect: Mood normal.        Behavior: Behavior normal.      No results found for any visits on 10/01/22.  No results found for this or any previous visit (from the past 2160 hour(Artur Winningham)).    Assessment & Plan:   Problem List Items Addressed This Visit   None   No follow-ups on file.   Total time spent: 20 minutes  Luna Fuse, MD  10/01/2022   This document may have been prepared by [facility] Voice Recognition software and as such may include unintentional dictation errors.

## 2022-10-08 ENCOUNTER — Other Ambulatory Visit: Payer: Medicare Other

## 2022-10-19 ENCOUNTER — Other Ambulatory Visit: Payer: Medicare Other

## 2022-10-20 LAB — COMPREHENSIVE METABOLIC PANEL
ALT: 11 IU/L (ref 0–32)
AST: 18 IU/L (ref 0–40)
Albumin/Globulin Ratio: 1.2 (ref 1.2–2.2)
Albumin: 3.9 g/dL (ref 3.6–4.6)
Alkaline Phosphatase: 65 IU/L (ref 44–121)
BUN/Creatinine Ratio: 20 (ref 12–28)
BUN: 30 mg/dL (ref 10–36)
Bilirubin Total: 0.5 mg/dL (ref 0.0–1.2)
CO2: 22 mmol/L (ref 20–29)
Calcium: 9.3 mg/dL (ref 8.7–10.3)
Chloride: 95 mmol/L — ABNORMAL LOW (ref 96–106)
Creatinine, Ser: 1.51 mg/dL — ABNORMAL HIGH (ref 0.57–1.00)
Globulin, Total: 3.3 g/dL (ref 1.5–4.5)
Glucose: 98 mg/dL (ref 70–99)
Potassium: 4.8 mmol/L (ref 3.5–5.2)
Sodium: 131 mmol/L — ABNORMAL LOW (ref 134–144)
Total Protein: 7.2 g/dL (ref 6.0–8.5)
eGFR: 33 mL/min/{1.73_m2} — ABNORMAL LOW (ref >59)

## 2022-10-28 NOTE — Progress Notes (Signed)
Patient notified

## 2022-10-29 ENCOUNTER — Telehealth: Payer: Self-pay | Admitting: Internal Medicine

## 2022-10-29 NOTE — Telephone Encounter (Signed)
Patient daughter left VM that she has questions about why TJ wants patient to be drinking Gatorade G2.

## 2022-11-01 NOTE — Telephone Encounter (Signed)
Patient's daughter, Massie Bougie left VM wanting to know how much and how often she should be drinking the Gatorade G2? Please advise.

## 2022-11-08 ENCOUNTER — Ambulatory Visit (INDEPENDENT_AMBULATORY_CARE_PROVIDER_SITE_OTHER): Payer: Medicare Other | Admitting: Podiatry

## 2022-11-08 ENCOUNTER — Encounter: Payer: Self-pay | Admitting: Podiatry

## 2022-11-08 VITALS — BP 143/58 | HR 65

## 2022-11-08 DIAGNOSIS — E0842 Diabetes mellitus due to underlying condition with diabetic polyneuropathy: Secondary | ICD-10-CM | POA: Diagnosis not present

## 2022-11-08 DIAGNOSIS — B351 Tinea unguium: Secondary | ICD-10-CM | POA: Diagnosis not present

## 2022-11-08 DIAGNOSIS — M79676 Pain in unspecified toe(s): Secondary | ICD-10-CM | POA: Diagnosis not present

## 2022-11-12 ENCOUNTER — Encounter: Payer: Self-pay | Admitting: Podiatry

## 2022-11-12 NOTE — Progress Notes (Unsigned)
  Subjective:  Patient ID: Haley Mayer, female    DOB: March 22, 1930,  MRN: [id number]  Haley Mayer presents to clinic today for: at risk foot care with history of diabetic neuropathy  Chief Complaint  Patient presents with   Diabetes    "Check my feet."  Dr. Ellsworth Lennox - 10/01/2022, glucose - 98 mg/dl    PCP is Haley Monday, MD.  Allergies  Allergen Reactions   Aspirin Nausea And Vomiting    REGULAR STRENGTH= Also upset stomach    Enalapril Maleate Other (See Comments)    angioedema   Iodinated Contrast Media Other (See Comments)    Vomiting and hives   Iohexol Nausea And Vomiting   Loperamide Hives   Other Other (See Comments)    Tomatoes--acid   Primidone Other (See Comments)    unknown unknown   Sulfa Antibiotics Other (See Comments)    Vomiting and hives   Vasotec [Enalapril]     unknown    Review of Systems: Negative except as noted in the HPI.  Objective: No changes noted in today's physical examination. Vitals:   11/08/22 1506  BP: (!) 143/58  Pulse: 65    Haley Mayer is a pleasant 89 y.o. female in NAD. AAO x 3.  Vascular Examination: Capillary refill time <3 seconds b/l LE. Palpable DP pulses b/l LE; nonpalpable PT pulses b/l. Digital hair absent b/l. Dependent edema b/l LE. Skin temperature gradient WNL b/l. No varicosities b/l.  Dermatological Examination: Pedal skin with normal turgor, texture and tone b/l. No open wounds. No interdigital macerations b/l. Toenails 1-5 b/l thickened, discolored, dystrophic with subungual debris. There is pain on palpation to dorsal aspect of nailplates. No hyperkeratotic nor porokeratotic lesions present on today's visit.Marland Kitchen  Neurological Examination: Protective sensation diminished with 10g monofilament b/l.  Musculoskeletal Examination: Muscle strength 5/5 to all lower extremity muscle groups bilaterally. No pain, crepitus or joint limitation noted with ROM bilateral LE.     Latest Ref Rng & Units  06/29/2022    9:42 AM  Hemoglobin A1C  Hemoglobin-A1c 4.8 - 5.6 % 6.3    Assessment/Plan: 1. Pain due to onychomycosis of toenail   2. Diabetes mellitus due to underlying condition with diabetic polyneuropathy, unspecified whether long term insulin use (HCC)     Patient was evaluated and treated. All patient's and/or POA's questions/concerns addressed on today's visit. Toenails 1-5 debrided in length and girth without incident. Continue soft, supportive shoe gear daily. Report any pedal injuries to medical professional. Call office if there are any questions/concerns. -Patient/POA to call should there be question/concern in the interim.   Return in about 3 months (around 02/08/2023).  Freddie Breech, DPM

## 2022-12-15 ENCOUNTER — Other Ambulatory Visit: Payer: Self-pay | Admitting: Internal Medicine

## 2023-01-05 ENCOUNTER — Encounter: Payer: Self-pay | Admitting: Internal Medicine

## 2023-01-12 ENCOUNTER — Ambulatory Visit: Payer: Medicare Other | Admitting: Internal Medicine

## 2023-01-18 ENCOUNTER — Ambulatory Visit (INDEPENDENT_AMBULATORY_CARE_PROVIDER_SITE_OTHER): Payer: Medicare Other | Admitting: Internal Medicine

## 2023-01-18 VITALS — BP 150/70 | Ht 62.0 in | Wt 171.6 lb

## 2023-01-18 DIAGNOSIS — E119 Type 2 diabetes mellitus without complications: Secondary | ICD-10-CM | POA: Diagnosis not present

## 2023-01-18 LAB — POCT CBG (FASTING - GLUCOSE)-MANUAL ENTRY: Glucose Fasting, POC: 130 mg/dL — AB (ref 70–99)

## 2023-01-18 NOTE — Progress Notes (Signed)
Established Patient Office Visit  Subjective:  Patient ID: Haley Mayer, female    DOB: Mar 16, 1930  Age: 89 y.o. MRN: [id number]  Chief Complaint  Patient presents with   Follow-up    3 mo f/u with lab results    No new complaints, here for lab review and medication refills. Failed to have previsit labs done but home bg readings have been at target without any hypoglycemic episodes.   No other concerns at this time.   Past Medical History:  Diagnosis Date   Arthritis    Chronic kidney disease    function no 100% per patient    Diabetes mellitus without complication (HCC)    Type II   Glaucoma    Hyperlipidemia    Hypertension     Past Surgical History:  Procedure Laterality Date   ABDOMINAL HYSTERECTOMY     APPENDECTOMY     CATARACT EXTRACTION W/PHACO Right 04/01/2021   Procedure: CATARACT EXTRACTION PHACO AND INTRAOCULAR LENS PLACEMENT (IOC) RIGHT DIABETIC KAHOOK DUAL BLADE GOINIOTOMY 10.28 01.35.6;  Surgeon: Lockie Mola, MD;  Location: [facility];  Service: Ophthalmology;  Laterality: Right;   CATARACT EXTRACTION W/PHACO Left 04/22/2021   Procedure: CATARACT EXTRACTION PHACO AND INTRAOCULAR LENS PLACEMENT (IOC) LEFT DIABETIC OMNI CANALOPLASTY AND TRABECULOTOMY 14.83 02:02.9;  Surgeon: Lockie Mola, MD;  Location: [facility];  Service: Ophthalmology;  Laterality: Left;  leave last case   CHOLECYSTECTOMY     OOPHORECTOMY     PARTIAL KNEE ARTHROPLASTY Left 08/07/2014   PARTIAL KNEE ARTHROPLASTY Left 08/07/2014   Procedure: LEFT MEDIAL PARTIAL KNEE REPLACEMENT ;  Surgeon: Samson Frederic, MD;  Location: [facility];  Service: Orthopedics;  Laterality: Left;   TONSILLECTOMY      Social History   Socioeconomic History   Marital status: Widowed    Spouse name: Not on file   Number of children: Not on file   Years of education: Not on file   Highest education level: Not on file  Occupational History   Not on file  Tobacco Use   Smoking  status: Never   Smokeless tobacco: Never  Substance and Sexual Activity   Alcohol use: No    Alcohol/week: 0.0 standard drinks of alcohol   Drug use: No   Sexual activity: Not on file  Other Topics Concern   Not on file  Social History Narrative   Not on file   Social Determinants of Health   Financial Resource Strain: Not on file  Food Insecurity: Not on file  Transportation Needs: Not on file  Physical Activity: Not on file  Stress: Not on file  Social Connections: Not on file  Intimate Partner Violence: Not on file    Family History  Problem Relation Age of Onset   Breast cancer Mother 31    Allergies  Allergen Reactions   Aspirin Nausea And Vomiting    REGULAR STRENGTH= Also upset stomach    Enalapril Maleate Other (See Comments)    angioedema   Iodinated Contrast Media Other (See Comments)    Vomiting and hives   Iohexol Nausea And Vomiting   Loperamide Hives   Other Other (See Comments)    Tomatoes--acid   Primidone Other (See Comments)    unknown unknown   Sulfa Antibiotics Other (See Comments)    Vomiting and hives   Vasotec [Enalapril]     unknown    Review of Systems  Constitutional: Negative.   HENT: Negative.    Eyes: Negative.   Respiratory:  Negative.    Cardiovascular: Negative.   Gastrointestinal: Negative.   Genitourinary: Negative.   Musculoskeletal:        Left shoulder pain that is exacerbated by lying on that side.  Skin: Negative.   Neurological: Negative.   Endo/Heme/Allergies: Negative.   Psychiatric/Behavioral:  The patient has insomnia.        Objective:   BP (!) 150/70   Ht 5\' 2"  (1.575 m)   Wt 171 lb 9.6 oz (77.8 kg)   BMI 31.39 kg/m   Vitals:   01/18/23 1510  BP: (!) 150/70  Height: 5\' 2"  (1.575 m)  Weight: 171 lb 9.6 oz (77.8 kg)  BMI (Calculated): 31.38    Physical Exam Vitals reviewed.  Constitutional:      General: She is not in acute distress. HENT:     Head: Normocephalic.     Nose: Nose normal.      Mouth/Throat:     Mouth: Mucous membranes are moist.  Eyes:     Extraocular Movements: Extraocular movements intact.     Pupils: Pupils are equal, round, and reactive to light.  Cardiovascular:     Rate and Rhythm: Normal rate and regular rhythm.     Heart sounds: No murmur heard. Pulmonary:     Effort: Pulmonary effort is normal.     Breath sounds: No rhonchi or rales.  Abdominal:     General: Abdomen is flat.     Palpations: There is no hepatomegaly, splenomegaly or mass.  Musculoskeletal:        General: Normal range of motion.     Cervical back: Normal range of motion. No tenderness.  Skin:    General: Skin is warm and dry.  Neurological:     General: No focal deficit present.     Mental Status: She is alert and oriented to person, place, and time.     Cranial Nerves: No cranial nerve deficit.     Motor: No weakness.  Psychiatric:        Mood and Affect: Mood normal.        Behavior: Behavior normal.      Results for orders placed or performed in visit on 01/18/23  POCT CBG (Fasting - Glucose)  Result Value Ref Range   Glucose Fasting, POC 130 (A) 70 - 99 mg/dL    Recent Results (from the past 2160 hour(Kwana Ringel))  POCT CBG (Fasting - Glucose)     Status: Abnormal   Collection Time: 01/18/23  3:18 PM  Result Value Ref Range   Glucose Fasting, POC 130 (A) 70 - 99 mg/dL      Assessment & Plan:  As per problem list  Problem List Items Addressed This Visit   None Visit Diagnoses     Type 2 diabetes mellitus without complication, without long-term current use of insulin (HCC)    -  Primary   Relevant Orders   POCT CBG (Fasting - Glucose) (Completed)       Return in about 3 months (around 04/19/2023) for fu with labs on day of appt.   Total time spent: 20 minutes  Luna Fuse, MD  01/18/2023   This document may have been prepared by [facility] Voice Recognition software and as such may include unintentional dictation errors.

## 2023-01-20 ENCOUNTER — Other Ambulatory Visit: Payer: Medicare Other

## 2023-01-20 DIAGNOSIS — E1169 Type 2 diabetes mellitus with other specified complication: Secondary | ICD-10-CM

## 2023-01-20 DIAGNOSIS — N1832 Chronic kidney disease, stage 3b: Secondary | ICD-10-CM

## 2023-01-20 DIAGNOSIS — E119 Type 2 diabetes mellitus without complications: Secondary | ICD-10-CM

## 2023-01-21 LAB — COMPREHENSIVE METABOLIC PANEL
ALT: 12 IU/L (ref 0–32)
AST: 20 IU/L (ref 0–40)
Albumin: 4.3 g/dL (ref 3.6–4.6)
Alkaline Phosphatase: 69 IU/L (ref 44–121)
BUN/Creatinine Ratio: 15 (ref 12–28)
BUN: 21 mg/dL (ref 10–36)
Bilirubin Total: 0.5 mg/dL (ref 0.0–1.2)
CO2: 25 mmol/L (ref 20–29)
Calcium: 10.1 mg/dL (ref 8.7–10.3)
Chloride: 94 mmol/L — ABNORMAL LOW (ref 96–106)
Creatinine, Ser: 1.39 mg/dL — ABNORMAL HIGH (ref 0.57–1.00)
Globulin, Total: 4.1 g/dL (ref 1.5–4.5)
Glucose: 98 mg/dL (ref 70–99)
Potassium: 5.4 mmol/L — ABNORMAL HIGH (ref 3.5–5.2)
Sodium: 131 mmol/L — ABNORMAL LOW (ref 134–144)
Total Protein: 8.4 g/dL (ref 6.0–8.5)
eGFR: 36 mL/min/{1.73_m2} — ABNORMAL LOW (ref >59)

## 2023-01-21 LAB — LIPID PANEL
Chol/HDL Ratio: 2.2 ratio (ref 0.0–4.4)
Cholesterol, Total: 135 mg/dL (ref 100–199)
HDL: 62 mg/dL (ref >39)
LDL Chol Calc (NIH): 61 mg/dL (ref 0–99)
Triglycerides: 52 mg/dL (ref 0–149)
VLDL Cholesterol Cal: 12 mg/dL (ref 5–40)

## 2023-01-21 LAB — HEMOGLOBIN A1C
Est. average glucose Bld gHb Est-mCnc: 123 mg/dL
Hgb A1c MFr Bld: 5.9 % — ABNORMAL HIGH (ref 4.8–5.6)

## 2023-01-25 NOTE — Progress Notes (Signed)
Patient and patient daughter notified and verbalized understanding.

## 2023-01-27 ENCOUNTER — Other Ambulatory Visit: Payer: Medicare Other

## 2023-01-28 LAB — POTASSIUM: Potassium: 4.4 mmol/L (ref 3.5–5.2)

## 2023-02-18 ENCOUNTER — Encounter: Payer: Self-pay | Admitting: Podiatry

## 2023-02-18 ENCOUNTER — Ambulatory Visit (INDEPENDENT_AMBULATORY_CARE_PROVIDER_SITE_OTHER): Payer: Medicare Other | Admitting: Podiatry

## 2023-02-18 DIAGNOSIS — E0842 Diabetes mellitus due to underlying condition with diabetic polyneuropathy: Secondary | ICD-10-CM

## 2023-02-18 DIAGNOSIS — B351 Tinea unguium: Secondary | ICD-10-CM

## 2023-02-18 DIAGNOSIS — M79676 Pain in unspecified toe(s): Secondary | ICD-10-CM | POA: Diagnosis not present

## 2023-02-24 NOTE — Progress Notes (Signed)
Subjective:  Patient ID: Haley Mayer, female    DOB: [birth year],  MRN: [id number]  89 y.o. female presents at risk foot care with history of diabetic neuropathy and painful thick toenails that are difficult to trim. Pain interferes with ambulation. Aggravating factors include wearing enclosed shoe gear. Pain is relieved with periodic professional debridement.  New problem(s): None   PCP is Sherron Monday, MD , and last visit was January 18, 2023.  Allergies  Allergen Reactions   Aspirin Nausea And Vomiting    REGULAR STRENGTH= Also upset stomach    Enalapril Maleate Other (See Comments)    angioedema   Iodinated Contrast Media Other (See Comments)    Vomiting and hives   Iohexol Nausea And Vomiting   Loperamide Hives   Other Other (See Comments)    Tomatoes--acid   Primidone Other (See Comments)    unknown unknown   Sulfa Antibiotics Other (See Comments)    Vomiting and hives   Vasotec [Enalapril]     unknown    Review of Systems: Negative except as noted in the HPI.   Objective:  Haley Mayer is a pleasant 89 y.o. female WD, WN in NAD.Marland Kitchen AAO x 3.  Vascular Examination: Vascular status with palpable DP pulses. Nonpalpable PT pulses. CFT <3 seconds b/l. Pedal hair diminished b/l. Trace edema b/l LE. No pain with calf compression b/l. Skin temperature gradient WNL b/l. No cyanosis or clubbing noted.  Neurological Examination: Pedal sensation diminished with 5.07 monofilament.  Dermatological Examination: Pedal skin with normal turgor, texture and tone b/l. No open wounds nor interdigital macerations noted. Toenails 1-5 b/l thick, discolored, elongated with subungual debris and pain on dorsal palpation. No hyperkeratotic lesions noted b/l.   Musculoskeletal Examination: Muscle strength 5/5 to b/l LE.  No pain, crepitus noted b/l. Uses rollator for mobility assistance.  Radiographs: None  Last A1c:      Latest Ref Rng & Units 01/20/2023    9:40 AM 06/29/2022     9:42 AM  Hemoglobin A1C  Hemoglobin-A1c 4.8 - 5.6 % 5.9  6.3     Assessment:   1. Pain due to onychomycosis of toenail   2. Diabetes mellitus due to underlying condition with diabetic polyneuropathy, unspecified whether long term insulin use (HCC)     Plan:  -Consent given for treatment as described below: -Examined patient. -Continue supportive shoe gear daily. -Mycotic toenails 1-5 bilaterally were debrided in length and girth with sterile nail nippers and dremel without incident. -Patient/POA to call should there be question/concern in the interim.  Return in about 3 months (around 05/21/2023).  Freddie Breech, DPM

## 2023-04-20 ENCOUNTER — Ambulatory Visit (INDEPENDENT_AMBULATORY_CARE_PROVIDER_SITE_OTHER): Payer: Medicare Other | Admitting: Internal Medicine

## 2023-04-20 ENCOUNTER — Encounter: Payer: Self-pay | Admitting: Internal Medicine

## 2023-04-20 VITALS — BP 136/64 | Ht 62.0 in | Wt 167.4 lb

## 2023-04-20 DIAGNOSIS — Z0130 Encounter for examination of blood pressure without abnormal findings: Secondary | ICD-10-CM

## 2023-04-20 DIAGNOSIS — Z23 Encounter for immunization: Secondary | ICD-10-CM

## 2023-04-20 DIAGNOSIS — R634 Abnormal weight loss: Secondary | ICD-10-CM | POA: Diagnosis not present

## 2023-04-20 DIAGNOSIS — E1169 Type 2 diabetes mellitus with other specified complication: Secondary | ICD-10-CM

## 2023-04-20 DIAGNOSIS — E785 Hyperlipidemia, unspecified: Secondary | ICD-10-CM | POA: Diagnosis not present

## 2023-04-20 DIAGNOSIS — E119 Type 2 diabetes mellitus without complications: Secondary | ICD-10-CM | POA: Insufficient documentation

## 2023-04-20 LAB — POCT CBG (FASTING - GLUCOSE)-MANUAL ENTRY: Glucose Fasting, POC: 90 mg/dL (ref 70–99)

## 2023-04-20 NOTE — Progress Notes (Signed)
Established Patient Office Visit  Subjective:  Patient ID: Haley Mayer, female    DOB: [birth year]  Age: 89 y.o. MRN: [id number]  Chief Complaint  Patient presents with   Follow-up    3 month follow up    No new complaints, here for lab review and medication refills. Lost weight since last visit and endorses good appetite but has been eating fewer calories per her daughter. Home bg readings have been satisfactory.    No other concerns at this time.   Past Medical History:  Diagnosis Date   Arthritis    Chronic kidney disease    function no 100% per patient    Diabetes mellitus without complication (HCC)    Type II   Glaucoma    Hyperlipidemia    Hypertension     Past Surgical History:  Procedure Laterality Date   ABDOMINAL HYSTERECTOMY     APPENDECTOMY     CATARACT EXTRACTION W/PHACO Right 04/01/2021   Procedure: CATARACT EXTRACTION PHACO AND INTRAOCULAR LENS PLACEMENT (IOC) RIGHT DIABETIC KAHOOK DUAL BLADE GOINIOTOMY 10.28 01.35.6;  Surgeon: Lockie Mola, MD;  Location: [facility];  Service: Ophthalmology;  Laterality: Right;   CATARACT EXTRACTION W/PHACO Left 04/22/2021   Procedure: CATARACT EXTRACTION PHACO AND INTRAOCULAR LENS PLACEMENT (IOC) LEFT DIABETIC OMNI CANALOPLASTY AND TRABECULOTOMY 14.83 02:02.9;  Surgeon: Lockie Mola, MD;  Location: [facility];  Service: Ophthalmology;  Laterality: Left;  leave last case   CHOLECYSTECTOMY     OOPHORECTOMY     PARTIAL KNEE ARTHROPLASTY Left 08/07/2014   PARTIAL KNEE ARTHROPLASTY Left 08/07/2014   Procedure: LEFT MEDIAL PARTIAL KNEE REPLACEMENT ;  Surgeon: Samson Frederic, MD;  Location: [facility];  Service: Orthopedics;  Laterality: Left;   TONSILLECTOMY      Social History   Socioeconomic History   Marital status: Widowed    Spouse name: Not on file   Number of children: Not on file   Years of education: Not on file   Highest education level: Not on file  Occupational History   Not  on file  Tobacco Use   Smoking status: Never   Smokeless tobacco: Never  Substance and Sexual Activity   Alcohol use: No    Alcohol/week: 0.0 standard drinks of alcohol   Drug use: No   Sexual activity: Not on file  Other Topics Concern   Not on file  Social History Narrative   Not on file   Social Determinants of Health   Financial Resource Strain: Not on file  Food Insecurity: Not on file  Transportation Needs: Not on file  Physical Activity: Not on file  Stress: Not on file  Social Connections: Not on file  Intimate Partner Violence: Not on file    Family History  Problem Relation Age of Onset   Breast cancer Mother 10    Allergies  Allergen Reactions   Aspirin Nausea And Vomiting    REGULAR STRENGTH= Also upset stomach    Enalapril Maleate Other (See Comments)    angioedema   Iodinated Contrast Media Other (See Comments)    Vomiting and hives   Iohexol Nausea And Vomiting   Loperamide Hives   Other Other (See Comments)    Tomatoes--acid   Primidone Other (See Comments)    unknown unknown   Sulfa Antibiotics Other (See Comments)    Vomiting and hives   Vasotec [Enalapril]     unknown    Outpatient Medications Prior to Visit  Medication Sig   acetaminophen (TYLENOL) 500  MG tablet Take by mouth.   amLODipine (NORVASC) 10 MG tablet TAKE 1 TABLET BY MOUTH ONCE  DAILY   aspirin EC 81 MG tablet Take 1 tablet (81 mg total) by mouth 2 (two) times daily after a meal.   bimatoprost (LUMIGAN) 0.01 % SOLN Place 1 drop into both eyes at bedtime.   Calcium Carbonate-Vit D-Min (CALCIUM 1200 PO) Take 1 tablet by mouth every morning.   Calcium Carbonate-Vitamin D 600-400 MG-UNIT tablet Take by mouth.   cholecalciferol (VITAMIN D) 1000 UNITS tablet Take 1,000 Units by mouth every morning.    diphenhydrAMINE (BENADRYL) 25 MG tablet Take 50 mg by mouth every 6 (six) hours as needed for itching or allergies.   divalproex (DEPAKOTE) 125 MG DR tablet Take 125 mg by mouth  daily.   docusate sodium (COLACE) 100 MG capsule Take 1 capsule (100 mg total) by mouth 2 (two) times daily.   gabapentin (NEURONTIN) 300 MG capsule Take 300 mg by mouth 3 (three) times daily.   glimepiride (AMARYL) 2 MG tablet Take by mouth.   INVOKANA 100 MG TABS tablet TAKE 1 TABLET BY MOUTH IN THE  MORNING   latanoprost (XALATAN) 0.005 % ophthalmic solution    magnesium oxide (MAG-OX) 400 (241.3 Mg) MG tablet    magnesium oxide (MAG-OX) 400 MG tablet Take by mouth.   multivitamin-iron-minerals-folic acid (CENTRUM) chewable tablet Chew 1 tablet by mouth every morning.    OneTouch Delica Lancets 33G MISC Apply topically.   ONETOUCH ULTRA test strip 1 each daily.   senna (SENOKOT) 8.6 MG TABS tablet Take 2 tablets (17.2 mg total) by mouth at bedtime.   simvastatin (ZOCOR) 10 MG tablet TAKE 1 TABLET BY MOUTH EVERY  NIGHT AT BEDTIME   valsartan-hydrochlorothiazide (DIOVAN-HCT) 320-12.5 MG tablet TAKE 1 TABLET BY MOUTH IN THE  MORNING   amLODipine (NORVASC) 10 MG tablet Take by mouth. (Patient not taking: Reported on 04/20/2023)   Facility-Administered Medications Prior to Visit  Medication Dose Route Frequency Provider   dexamethasone (DECADRON) injection 4 mg  4 mg Intravenous Once Samson Frederic, MD   ondansetron (ZOFRAN) 4 mg in sodium chloride 0.9 % 50 mL IVPB  4 mg Intravenous Once Samson Frederic, MD    Review of Systems  Constitutional: Negative.   HENT: Negative.    Eyes: Negative.   Respiratory: Negative.    Cardiovascular: Negative.   Gastrointestinal: Negative.  Negative for blood in stool, constipation and heartburn.  Genitourinary: Negative.   Musculoskeletal:        Left shoulder pain resolved  Skin: Negative.   Neurological: Negative.   Endo/Heme/Allergies: Negative.   Psychiatric/Behavioral:  The patient has insomnia.        Objective:   BP 136/64   Ht 5\' 2"  (1.575 m)   Wt 167 lb 6.4 oz (75.9 kg)   BMI 30.62 kg/m   Vitals:   04/20/23 0856  BP: 136/64   Height: 5\' 2"  (1.575 m)  Weight: 167 lb 6.4 oz (75.9 kg)  BMI (Calculated): 30.61    Physical Exam Vitals reviewed.  Constitutional:      General: She is not in acute distress. HENT:     Head: Normocephalic.     Nose: Nose normal.     Mouth/Throat:     Mouth: Mucous membranes are moist.  Eyes:     Extraocular Movements: Extraocular movements intact.     Pupils: Pupils are equal, round, and reactive to light.  Cardiovascular:     Rate and Rhythm: Normal  rate and regular rhythm.     Heart sounds: No murmur heard. Pulmonary:     Effort: Pulmonary effort is normal.     Breath sounds: No rhonchi or rales.  Abdominal:     General: Abdomen is flat.     Palpations: There is no hepatomegaly, splenomegaly or mass.  Musculoskeletal:        General: Normal range of motion.     Cervical back: Normal range of motion. No tenderness.  Skin:    General: Skin is warm and dry.  Neurological:     General: No focal deficit present.     Mental Status: She is alert and oriented to person, place, and time.     Cranial Nerves: No cranial nerve deficit.     Motor: No weakness.  Psychiatric:        Mood and Affect: Mood normal.        Behavior: Behavior normal.      Results for orders placed or performed in visit on 04/20/23  POCT CBG (Fasting - Glucose)  Result Value Ref Range   Glucose Fasting, POC 90 70 - 99 mg/dL    Recent Results (from the past 2160 hour(Tylyn Stankovich))  Potassium     Status: None   Collection Time: 01/27/23  1:10 PM  Result Value Ref Range   Potassium 4.4 3.5 - 5.2 mmol/L  POCT CBG (Fasting - Glucose)     Status: None   Collection Time: 04/20/23  9:02 AM  Result Value Ref Range   Glucose Fasting, POC 90 70 - 99 mg/dL      Assessment & Plan:  As per problem list  Problem List Items Addressed This Visit       Endocrine   Hyperlipidemia associated with type 2 diabetes mellitus (HCC)   Relevant Orders   Lipid panel   Type 2 diabetes mellitus without complication,  without long-term current use of insulin (HCC) - Primary   Relevant Orders   POCT CBG (Fasting - Glucose) (Completed)   Hemoglobin A1c   Hemoglobin A1c     Other   Weight loss   Relevant Orders   CBC With Diff/Platelet   Comprehensive metabolic panel    Return in about 3 months (around 07/19/2023) for awv with labs prior.   Total time spent: 20 minutes  Luna Fuse, MD  04/20/2023   This document may have been prepared by [facility] Voice Recognition software and as such may include unintentional dictation errors.

## 2023-04-21 LAB — COMPREHENSIVE METABOLIC PANEL
ALT: 13 [IU]/L (ref 0–32)
AST: 22 [IU]/L (ref 0–40)
Albumin: 4.1 g/dL (ref 3.6–4.6)
Alkaline Phosphatase: 76 [IU]/L (ref 44–121)
BUN/Creatinine Ratio: 19 (ref 12–28)
BUN: 28 mg/dL (ref 10–36)
Bilirubin Total: 0.7 mg/dL (ref 0.0–1.2)
CO2: 24 mmol/L (ref 20–29)
Calcium: 9.6 mg/dL (ref 8.7–10.3)
Chloride: 97 mmol/L (ref 96–106)
Creatinine, Ser: 1.45 mg/dL — ABNORMAL HIGH (ref 0.57–1.00)
Globulin, Total: 4.1 g/dL (ref 1.5–4.5)
Glucose: 104 mg/dL — ABNORMAL HIGH (ref 70–99)
Potassium: 5.1 mmol/L (ref 3.5–5.2)
Sodium: 133 mmol/L — ABNORMAL LOW (ref 134–144)
Total Protein: 8.2 g/dL (ref 6.0–8.5)
eGFR: 34 mL/min/{1.73_m2} — ABNORMAL LOW (ref >59)

## 2023-04-21 LAB — CBC WITH DIFF/PLATELET
Basophils Absolute: 0 10*3/uL (ref 0.0–0.2)
Basos: 1 %
EOS (ABSOLUTE): 0.1 10*3/uL (ref 0.0–0.4)
Eos: 2 %
Hematocrit: 39.6 % (ref 34.0–46.6)
Hemoglobin: 13.4 g/dL (ref 11.1–15.9)
Immature Grans (Abs): 0 10*3/uL (ref 0.0–0.1)
Immature Granulocytes: 0 %
Lymphocytes Absolute: 1.2 10*3/uL (ref 0.7–3.1)
Lymphs: 41 %
MCH: 32.1 pg (ref 26.6–33.0)
MCHC: 33.8 g/dL (ref 31.5–35.7)
MCV: 95 fL (ref 79–97)
Monocytes Absolute: 0.4 10*3/uL (ref 0.1–0.9)
Monocytes: 15 %
Neutrophils Absolute: 1.2 10*3/uL — ABNORMAL LOW (ref 1.4–7.0)
Neutrophils: 41 %
Platelets: 159 10*3/uL (ref 150–450)
RBC: 4.18 x10E6/uL (ref 3.77–5.28)
RDW: 13.5 % (ref 11.7–15.4)
WBC: 3 10*3/uL — ABNORMAL LOW (ref 3.4–10.8)

## 2023-04-21 LAB — HEMOGLOBIN A1C
Est. average glucose Bld gHb Est-mCnc: 126 mg/dL
Hgb A1c MFr Bld: 6 % — ABNORMAL HIGH (ref 4.8–5.6)

## 2023-04-21 LAB — LIPID PANEL
Chol/HDL Ratio: 2.5 {ratio} (ref 0.0–4.4)
Cholesterol, Total: 148 mg/dL (ref 100–199)
HDL: 59 mg/dL (ref >39)
LDL Chol Calc (NIH): 78 mg/dL (ref 0–99)
Triglycerides: 49 mg/dL (ref 0–149)
VLDL Cholesterol Cal: 11 mg/dL (ref 5–40)

## 2023-04-26 ENCOUNTER — Other Ambulatory Visit: Payer: Self-pay | Admitting: Internal Medicine

## 2023-05-13 ENCOUNTER — Other Ambulatory Visit: Payer: Self-pay | Admitting: Internal Medicine

## 2023-05-26 ENCOUNTER — Ambulatory Visit (INDEPENDENT_AMBULATORY_CARE_PROVIDER_SITE_OTHER): Payer: Medicare Other | Admitting: Podiatry

## 2023-05-26 ENCOUNTER — Encounter: Payer: Self-pay | Admitting: Podiatry

## 2023-05-26 VITALS — Ht 62.0 in | Wt 167.4 lb

## 2023-05-26 DIAGNOSIS — M79676 Pain in unspecified toe(s): Secondary | ICD-10-CM | POA: Diagnosis not present

## 2023-05-26 DIAGNOSIS — E0842 Diabetes mellitus due to underlying condition with diabetic polyneuropathy: Secondary | ICD-10-CM | POA: Diagnosis not present

## 2023-05-26 DIAGNOSIS — B351 Tinea unguium: Secondary | ICD-10-CM | POA: Diagnosis not present

## 2023-05-26 DIAGNOSIS — E119 Type 2 diabetes mellitus without complications: Secondary | ICD-10-CM

## 2023-06-02 ENCOUNTER — Other Ambulatory Visit: Payer: Self-pay | Admitting: Internal Medicine

## 2023-06-03 ENCOUNTER — Encounter: Payer: Self-pay | Admitting: Podiatry

## 2023-06-03 NOTE — Progress Notes (Signed)
Subjective:  Patient ID: Haley Mayer, female    DOB: 1929/06/23,  MRN: [id number]  BAHJA DORING presents to clinic today for for annual diabetic foot examination  Chief Complaint  Patient presents with   Nail Problem    Pt is here for [facility] PT is unsure of last A1C PCP is Dr Ellsworth Lennox and LOV was in December.   New problem(s): None.   PCP is Sherron Monday, MD.  Allergies  Allergen Reactions   Aspirin Nausea And Vomiting    REGULAR STRENGTH= Also upset stomach    Enalapril Maleate Other (See Comments)    angioedema   Iodinated Contrast Media Other (See Comments)    Vomiting and hives   Iohexol Nausea And Vomiting   Loperamide Hives   Other Other (See Comments)    Tomatoes--acid   Primidone Other (See Comments)    unknown unknown   Sulfa Antibiotics Other (See Comments)    Vomiting and hives   Vasotec [Enalapril]     unknown    Review of Systems: Negative except as noted in the HPI.  Objective: No changes noted in today's physical examination. There were no vitals filed for this visit. Haley Mayer is a pleasant 89 y.o. female WD, WN in NAD. AAO x 3.   Title   Diabetic Foot Exam - detailed Date & Time: 05/26/2023 11:15 AM Diabetic Foot exam was performed with the following findings: Yes  Visual Foot Exam completed.: Yes  Is there a history of foot ulcer?: No Is there a foot ulcer now?: No Is there swelling?: Yes Is there elevated skin temperature?: No Is there abnormal foot shape?: No Is there a claw toe deformity?: No Are the toenails long?: Yes Are the toenails thick?: Yes Are the toenails ingrown?: Yes Is the skin thin, fragile, shiny and hairless?": No Normal Range of Motion?: Yes Is there foot or ankle muscle weakness?: No Do you have pain in calf while walking?: No Are the shoes appropriate in style and fit?: Yes Can the patient see the bottom of their feet?: No Pulse Foot Exam completed.: Yes   Right Posterior Tibialis: Absent Left posterior  Tibialis: Absent   Right Dorsalis Pedis: Present Left Dorsalis Pedis: Present     Sensory Foot Exam Completed.: Yes Semmes-Weinstein Monofilament Test "+" means "has sensation" and "-" means "no sensation"  R Foot Test Control: Neg L Foot Test Control: Neg   R Site 1-Great Toe: Neg L Site 1-Great Toe: Neg   R Site 4: Neg L Site 4: Neg   R site 5: Neg L Site 5: Neg  R Site 6: Neg L Site 6: Neg     Image components are not supported.   Image components are not supported. Image components are not supported.  T[place] Right vibratory: diminished Left vibratory: diminished  Comments Muscle strength 5/5 to all lower extremity muscle groups bilaterally. No pain, crepitus or joint limitation noted with ROM bilateral LE. No gross bony deformities bilaterally.      Assessment/Plan: 1. Pain due to onychomycosis of toenail   2. Diabetes mellitus due to underlying condition with diabetic polyneuropathy, unspecified whether long term insulin use (HCC)   3. Encounter for diabetic foot exam [facility])    -Patient was evaluated today. All questions/concerns addressed on today's visit. -Diabetic foot examination performed today. -Continue diabetic foot care principles: inspect feet daily, monitor glucose as recommended by PCP and/or Endocrinologist, and follow prescribed diet per PCP, Endocrinologist and/or dietician. -Toenails 1-5  b/l were debrided in length and girth with sterile nail nippers and dremel without iatrogenic bleeding.  -Patient/POA to call should there be question/concern in the interim.   Return in about 3 months (around 08/24/2023).  Haley Mayer, DPM      [place] LOCATION: [address] [place] [address]                   Office [phone]   [facility] LOCATION: [address] [place], [place] [address] Office [phone]

## 2023-07-18 ENCOUNTER — Other Ambulatory Visit: Payer: Self-pay

## 2023-07-18 MED ORDER — CANAGLIFLOZIN 100 MG PO TABS: 100.0000 mg | ORAL_TABLET | Freq: Every morning | ORAL | 1 refills | Status: DC

## 2023-07-19 ENCOUNTER — Ambulatory Visit: Payer: Medicare Other | Admitting: Internal Medicine

## 2023-07-22 ENCOUNTER — Ambulatory Visit: Payer: Medicare Other | Admitting: Internal Medicine

## 2023-07-22 ENCOUNTER — Encounter: Payer: Self-pay | Admitting: Internal Medicine

## 2023-07-22 VITALS — BP 118/67 | HR 60 | Ht 62.0 in | Wt 165.0 lb

## 2023-07-22 DIAGNOSIS — E1169 Type 2 diabetes mellitus with other specified complication: Secondary | ICD-10-CM

## 2023-07-22 DIAGNOSIS — I10 Essential (primary) hypertension: Secondary | ICD-10-CM | POA: Diagnosis not present

## 2023-07-22 DIAGNOSIS — E119 Type 2 diabetes mellitus without complications: Secondary | ICD-10-CM

## 2023-07-22 DIAGNOSIS — Z1331 Encounter for screening for depression: Secondary | ICD-10-CM | POA: Diagnosis not present

## 2023-07-22 DIAGNOSIS — N1832 Chronic kidney disease, stage 3b: Secondary | ICD-10-CM

## 2023-07-22 DIAGNOSIS — Z0001 Encounter for general adult medical examination with abnormal findings: Secondary | ICD-10-CM | POA: Diagnosis not present

## 2023-07-22 DIAGNOSIS — E785 Hyperlipidemia, unspecified: Secondary | ICD-10-CM

## 2023-07-22 LAB — POCT CBG (FASTING - GLUCOSE)-MANUAL ENTRY: Glucose Fasting, POC: 109 mg/dL — AB (ref 70–99)

## 2023-07-22 NOTE — Progress Notes (Signed)
 Established Patient Office Visit  Subjective:  Patient ID: Haley Mayer, female    DOB: 22-Apr-1930  Age: 89 y.o. MRN: [id number]  Chief Complaint  Patient presents with   Follow-up    3 month follow up     No new complaints, here for AWV refer to quality metrics and scanned documents.  Reports flare of chronic left shoulder pain which is worse at night and relieved by otc tylenol. Lost weight with lower calorie intake.     No other concerns at this time.   Past Medical History:  Diagnosis Date   Arthritis    Chronic kidney disease    function no 100% per patient    Diabetes mellitus without complication (HCC)    Type II   Glaucoma    Hyperlipidemia    Hypertension     Past Surgical History:  Procedure Laterality Date   ABDOMINAL HYSTERECTOMY     APPENDECTOMY     CATARACT EXTRACTION W/PHACO Right 04/01/2021   Procedure: CATARACT EXTRACTION PHACO AND INTRAOCULAR LENS PLACEMENT (IOC) RIGHT DIABETIC KAHOOK DUAL BLADE GOINIOTOMY 10.28 01.35.6;  Surgeon: Lockie Mola, MD;  Location: [facility];  Service: Ophthalmology;  Laterality: Right;   CATARACT EXTRACTION W/PHACO Left 04/22/2021   Procedure: CATARACT EXTRACTION PHACO AND INTRAOCULAR LENS PLACEMENT (IOC) LEFT DIABETIC OMNI CANALOPLASTY AND TRABECULOTOMY 14.83 02:02.9;  Surgeon: Lockie Mola, MD;  Location: [facility];  Service: Ophthalmology;  Laterality: Left;  leave last case   CHOLECYSTECTOMY     OOPHORECTOMY     PARTIAL KNEE ARTHROPLASTY Left 08/07/2014   PARTIAL KNEE ARTHROPLASTY Left 08/07/2014   Procedure: LEFT MEDIAL PARTIAL KNEE REPLACEMENT ;  Surgeon: Samson Frederic, MD;  Location: [facility] OR;  Service: Orthopedics;  Laterality: Left;   TONSILLECTOMY      Social History   Socioeconomic History   Marital status: Widowed    Spouse name: Not on file   Number of children: Not on file   Years of education: Not on file   Highest education level: Not on file  Occupational History    Not on file  Tobacco Use   Smoking status: Never   Smokeless tobacco: Never  Substance and Sexual Activity   Alcohol use: No    Alcohol/week: 0.0 standard drinks of alcohol   Drug use: No   Sexual activity: Not on file  Other Topics Concern   Not on file  Social History Narrative   Not on file   Social Drivers of Health   Financial Resource Strain: Not on file  Food Insecurity: Not on file  Transportation Needs: Not on file  Physical Activity: Not on file  Stress: Not on file  Social Connections: Not on file  Intimate Partner Violence: Not on file    Family History  Problem Relation Age of Onset   Breast cancer Mother 59    Allergies  Allergen Reactions   Aspirin Nausea And Vomiting    REGULAR STRENGTH= Also upset stomach    Enalapril Maleate Other (See Comments)    angioedema   Iodinated Contrast Media Other (See Comments)    Vomiting and hives   Iohexol Nausea And Vomiting   Loperamide Hives   Other Other (See Comments)    Tomatoes--acid   Primidone Other (See Comments)    unknown unknown   Sulfa Antibiotics Other (See Comments)    Vomiting and hives   Vasotec [Enalapril]     unknown    Outpatient Medications Prior to Visit  Medication Sig  acetaminophen (TYLENOL) 500 MG tablet Take by mouth.   amLODipine (NORVASC) 10 MG tablet TAKE 1 TABLET BY MOUTH ONCE  DAILY   aspirin EC 81 MG tablet Take 1 tablet (81 mg total) by mouth 2 (two) times daily after a meal.   bimatoprost (LUMIGAN) 0.01 % SOLN Place 1 drop into both eyes at bedtime.   Calcium Carbonate-Vit D-Min (CALCIUM 1200 PO) Take 1 tablet by mouth every morning.   Calcium Carbonate-Vitamin D 600-400 MG-UNIT tablet Take by mouth.   canagliflozin (INVOKANA) 100 MG TABS tablet Take 1 tablet (100 mg total) by mouth every morning.   cholecalciferol (VITAMIN D) 1000 UNITS tablet Take 1,000 Units by mouth every morning.    diphenhydrAMINE (BENADRYL) 25 MG tablet Take 50 mg by mouth every 6 (six) hours  as needed for itching or allergies.   divalproex (DEPAKOTE) 125 MG DR tablet Take 125 mg by mouth daily.   docusate sodium (COLACE) 100 MG capsule Take 1 capsule (100 mg total) by mouth 2 (two) times daily.   gabapentin (NEURONTIN) 300 MG capsule Take 300 mg by mouth 3 (three) times daily.   glimepiride (AMARYL) 2 MG tablet Take by mouth.   latanoprost (XALATAN) 0.005 % ophthalmic solution    magnesium oxide (MAG-OX) 400 (241.3 Mg) MG tablet    magnesium oxide (MAG-OX) 400 MG tablet Take by mouth.   multivitamin-iron-minerals-folic acid (CENTRUM) chewable tablet Chew 1 tablet by mouth every morning.    OneTouch Delica Lancets 33G MISC Apply topically.   ONETOUCH ULTRA test strip 1 each daily.   senna (SENOKOT) 8.6 MG TABS tablet Take 2 tablets (17.2 mg total) by mouth at bedtime.   simvastatin (ZOCOR) 10 MG tablet TAKE 1 TABLET BY MOUTH EVERY  NIGHT AT BEDTIME   valsartan-hydrochlorothiazide (DIOVAN-HCT) 320-12.5 MG tablet TAKE 1 TABLET BY MOUTH IN THE  MORNING   Facility-Administered Medications Prior to Visit  Medication Dose Route Frequency Provider   dexamethasone (DECADRON) injection 4 mg  4 mg Intravenous Once Swinteck, Arlys John, MD   ondansetron (ZOFRAN) 4 mg in sodium chloride 0.9 % 50 mL IVPB  4 mg Intravenous Once Samson Frederic, MD    Review of Systems  Constitutional: Negative.   HENT: Negative.    Eyes: Negative.   Respiratory: Negative.    Cardiovascular: Negative.   Gastrointestinal: Negative.  Negative for blood in stool, constipation and heartburn.  Genitourinary: Negative.   Musculoskeletal:        Left shoulder pain resolved  Skin: Negative.   Neurological: Negative.   Endo/Heme/Allergies: Negative.   Psychiatric/Behavioral:  The patient has insomnia.        Objective:   BP 118/67   Pulse 60   Ht 5\' 2"  (1.575 m)   Wt 165 lb (74.8 kg)   SpO2 99%   BMI 30.18 kg/m   Vitals:   07/22/23 1122  BP: 118/67  Pulse: 60  Height: 5\' 2"  (1.575 m)  Weight: 165  lb (74.8 kg)  SpO2: 99%  BMI (Calculated): 30.17    Physical Exam Vitals reviewed.  Constitutional:      General: She is not in acute distress. HENT:     Head: Normocephalic.     Nose: Nose normal.     Mouth/Throat:     Mouth: Mucous membranes are moist.  Eyes:     Extraocular Movements: Extraocular movements intact.     Pupils: Pupils are equal, round, and reactive to light.  Cardiovascular:     Rate and Rhythm: Normal rate  and regular rhythm.     Heart sounds: No murmur heard. Pulmonary:     Effort: Pulmonary effort is normal.     Breath sounds: No rhonchi or rales.  Abdominal:     General: Abdomen is flat.     Palpations: There is no hepatomegaly, splenomegaly or mass.  Musculoskeletal:        General: Normal range of motion.     Cervical back: Normal range of motion. No tenderness.  Skin:    General: Skin is warm and dry.  Neurological:     General: No focal deficit present.     Mental Status: She is alert and oriented to person, place, and time.     Cranial Nerves: No cranial nerve deficit.     Motor: No weakness.  Psychiatric:        Mood and Affect: Mood normal.        Behavior: Behavior normal.      Results for orders placed or performed in visit on 07/22/23  POCT CBG (Fasting - Glucose)  Result Value Ref Range   Glucose Fasting, POC 109 (A) 70 - 99 mg/dL    Recent Results (from the past 2160 hours)  POCT CBG (Fasting - Glucose)     Status: Abnormal   Collection Time: 07/22/23 11:35 AM  Result Value Ref Range   Glucose Fasting, POC 109 (A) 70 - 99 mg/dL      Assessment & Plan:  As per problem list  Problem List Items Addressed This Visit       Endocrine   Type 2 diabetes mellitus without complication, without long-term current use of insulin (HCC) - Primary   Relevant Orders   POCT CBG (Fasting - Glucose) (Completed)    No follow-ups on file.   Total time spent: 30 minutes  Luna Fuse, MD  07/22/2023   This document may  have been prepared by [facility] Voice Recognition software and as such may include unintentional dictation errors.

## 2023-07-24 ENCOUNTER — Inpatient Hospital Stay
Admission: EM | Admit: 2023-07-24 | Discharge: 2023-07-25 | DRG: 309 | Disposition: A | Discharge status: Other Acute Inpatient Hospital | Attending: Internal Medicine | Admitting: Internal Medicine

## 2023-07-24 ENCOUNTER — Emergency Department

## 2023-07-24 ENCOUNTER — Other Ambulatory Visit: Payer: Self-pay

## 2023-07-24 ENCOUNTER — Inpatient Hospital Stay

## 2023-07-24 DIAGNOSIS — E785 Hyperlipidemia, unspecified: Secondary | ICD-10-CM | POA: Diagnosis present

## 2023-07-24 DIAGNOSIS — E1122 Type 2 diabetes mellitus with diabetic chronic kidney disease: Secondary | ICD-10-CM | POA: Diagnosis present

## 2023-07-24 DIAGNOSIS — Z7984 Long term (current) use of oral hypoglycemic drugs: Secondary | ICD-10-CM

## 2023-07-24 DIAGNOSIS — G20A1 Parkinson's disease without dyskinesia, without mention of fluctuations: Secondary | ICD-10-CM | POA: Diagnosis present

## 2023-07-24 DIAGNOSIS — Z91041 Radiographic dye allergy status: Secondary | ICD-10-CM | POA: Diagnosis not present

## 2023-07-24 DIAGNOSIS — Z90710 Acquired absence of both cervix and uterus: Secondary | ICD-10-CM

## 2023-07-24 DIAGNOSIS — R296 Repeated falls: Secondary | ICD-10-CM | POA: Diagnosis present

## 2023-07-24 DIAGNOSIS — R55 Syncope and collapse: Secondary | ICD-10-CM | POA: Diagnosis not present

## 2023-07-24 DIAGNOSIS — Z7982 Long term (current) use of aspirin: Secondary | ICD-10-CM

## 2023-07-24 DIAGNOSIS — I447 Left bundle-branch block, unspecified: Secondary | ICD-10-CM | POA: Diagnosis present

## 2023-07-24 DIAGNOSIS — Z882 Allergy status to sulfonamides status: Secondary | ICD-10-CM | POA: Diagnosis not present

## 2023-07-24 DIAGNOSIS — Z888 Allergy status to other drugs, medicaments and biological substances status: Secondary | ICD-10-CM

## 2023-07-24 DIAGNOSIS — I129 Hypertensive chronic kidney disease with stage 1 through stage 4 chronic kidney disease, or unspecified chronic kidney disease: Secondary | ICD-10-CM | POA: Diagnosis present

## 2023-07-24 DIAGNOSIS — Z91018 Allergy to other foods: Secondary | ICD-10-CM | POA: Diagnosis not present

## 2023-07-24 DIAGNOSIS — N1832 Chronic kidney disease, stage 3b: Secondary | ICD-10-CM | POA: Diagnosis present

## 2023-07-24 DIAGNOSIS — Z886 Allergy status to analgesic agent status: Secondary | ICD-10-CM

## 2023-07-24 DIAGNOSIS — I493 Ventricular premature depolarization: Secondary | ICD-10-CM | POA: Diagnosis present

## 2023-07-24 DIAGNOSIS — I441 Atrioventricular block, second degree: Secondary | ICD-10-CM | POA: Diagnosis present

## 2023-07-24 DIAGNOSIS — E871 Hypo-osmolality and hyponatremia: Secondary | ICD-10-CM | POA: Diagnosis present

## 2023-07-24 DIAGNOSIS — D72818 Other decreased white blood cell count: Secondary | ICD-10-CM | POA: Diagnosis present

## 2023-07-24 DIAGNOSIS — Z9049 Acquired absence of other specified parts of digestive tract: Secondary | ICD-10-CM | POA: Diagnosis not present

## 2023-07-24 DIAGNOSIS — Z96652 Presence of left artificial knee joint: Secondary | ICD-10-CM | POA: Diagnosis present

## 2023-07-24 DIAGNOSIS — Z79899 Other long term (current) drug therapy: Secondary | ICD-10-CM

## 2023-07-24 DIAGNOSIS — Z9181 History of falling: Secondary | ICD-10-CM | POA: Diagnosis not present

## 2023-07-24 LAB — RESP PANEL BY RT-PCR (RSV, FLU A&B, COVID)  RVPGX2
Influenza A by PCR: NEGATIVE
Influenza B by PCR: NEGATIVE
Resp Syncytial Virus by PCR: NEGATIVE
SARS Coronavirus 2 by RT PCR: NEGATIVE

## 2023-07-24 LAB — URINALYSIS, W/ REFLEX TO CULTURE (INFECTION SUSPECTED)
Bilirubin Urine: NEGATIVE
Glucose, UA: >=500 mg/dL — AB
Hgb urine dipstick: NEGATIVE
Ketones, ur: NEGATIVE mg/dL
Leukocytes,Ua: NEGATIVE
Nitrite: NEGATIVE
Protein, ur: NEGATIVE mg/dL
Specific Gravity, Urine: 1.01 (ref 1.005–1.030)
WBC, UA: 0 WBC/hpf (ref 0–5)
pH: 6 (ref 5.0–8.0)

## 2023-07-24 LAB — BASIC METABOLIC PANEL
Anion gap: 8 (ref 5–15)
BUN: 32 mg/dL — ABNORMAL HIGH (ref 8–23)
CO2: 24 mmol/L (ref 22–32)
Calcium: 9.5 mg/dL (ref 8.9–10.3)
Chloride: 100 mmol/L (ref 98–111)
Creatinine, Ser: 1.44 mg/dL — ABNORMAL HIGH (ref 0.44–1.00)
GFR, Estimated: 34 mL/min — ABNORMAL LOW (ref >60)
Glucose, Bld: 115 mg/dL — ABNORMAL HIGH (ref 70–99)
Potassium: 5.4 mmol/L — ABNORMAL HIGH (ref 3.5–5.1)
Sodium: 132 mmol/L — ABNORMAL LOW (ref 135–145)

## 2023-07-24 LAB — CBC
HCT: 39.9 % (ref 36.0–46.0)
Hemoglobin: 13.4 g/dL (ref 12.0–15.0)
MCH: 32.3 pg (ref 26.0–34.0)
MCHC: 33.6 g/dL (ref 30.0–36.0)
MCV: 96.1 fL (ref 80.0–100.0)
Platelets: 152 10*3/uL (ref 150–400)
RBC: 4.15 MIL/uL (ref 3.87–5.11)
RDW: 13.4 % (ref 11.5–15.5)
WBC: 2.8 10*3/uL — ABNORMAL LOW (ref 4.0–10.5)
nRBC: 0 % (ref 0.0–0.2)

## 2023-07-24 LAB — CBG MONITORING, ED
Glucose-Capillary: 110 mg/dL — ABNORMAL HIGH (ref 70–99)
Glucose-Capillary: 106 mg/dL — ABNORMAL HIGH (ref 70–99)

## 2023-07-24 LAB — TSH: TSH: 3.826 u[IU]/mL (ref 0.350–4.500)

## 2023-07-24 LAB — PHOSPHORUS: Phosphorus: 3.8 mg/dL (ref 2.5–4.6)

## 2023-07-24 LAB — TROPONIN I (HIGH SENSITIVITY)
Troponin I (High Sensitivity): 14 ng/L (ref <18)
Troponin I (High Sensitivity): 15 ng/L (ref <18)

## 2023-07-24 LAB — MAGNESIUM: Magnesium: 2.3 mg/dL (ref 1.7–2.4)

## 2023-07-24 LAB — CORTISOL: Cortisol, Plasma: 10.2 ug/dL

## 2023-07-24 MED ORDER — HYDRALAZINE HCL 20 MG/ML IJ SOLN: 5.0000 mg | Freq: Four times a day (QID) | INTRAMUSCULAR | Status: DC | PRN

## 2023-07-24 MED ORDER — LATANOPROST 0.005 % OP SOLN
1.0000 [drp] | Freq: Every day | OPHTHALMIC | Status: DC
  Administered 2023-07-24: 1 [drp] via OPHTHALMIC
  Filled 2023-07-24 (×2): qty 2.5

## 2023-07-24 MED ORDER — ACETAMINOPHEN 325 MG PO TABS: 650.0000 mg | ORAL_TABLET | Freq: Four times a day (QID) | ORAL | Status: DC | PRN

## 2023-07-24 MED ORDER — INSULIN ASPART 100 UNIT/ML IJ SOLN: 0.0000 [IU] | Freq: Three times a day (TID) | INTRAMUSCULAR | Status: DC

## 2023-07-24 MED ORDER — SODIUM CHLORIDE 0.9% FLUSH
3.0000 mL | Freq: Two times a day (BID) | INTRAVENOUS | Status: DC
  Administered 2023-07-24 – 2023-07-25 (×2): 3 mL via INTRAVENOUS

## 2023-07-24 MED ORDER — LATANOPROST 0.005 % OP SOLN: 1.0000 [drp] | Freq: Every day | OPHTHALMIC | Status: DC

## 2023-07-24 MED ORDER — ASPIRIN 81 MG PO TBEC
81.0000 mg | DELAYED_RELEASE_TABLET | Freq: Every day | ORAL | Status: DC
  Administered 2023-07-24 – 2023-07-25 (×2): 81 mg via ORAL
  Filled 2023-07-24 (×2): qty 1

## 2023-07-24 MED ORDER — SIMVASTATIN 10 MG PO TABS
10.0000 mg | ORAL_TABLET | Freq: Every day | ORAL | Status: DC
  Administered 2023-07-24: 10 mg via ORAL
  Filled 2023-07-24 (×2): qty 1

## 2023-07-24 MED ORDER — DIPHENHYDRAMINE HCL 25 MG PO TABS: 50.0000 mg | ORAL_TABLET | Freq: Four times a day (QID) | ORAL | Status: DC | PRN

## 2023-07-24 MED ORDER — SENNA 8.6 MG PO TABS
2.0000 | ORAL_TABLET | Freq: Every day | ORAL | Status: DC
  Administered 2023-07-24: 17.2 mg via ORAL
  Filled 2023-07-24 (×3): qty 2

## 2023-07-24 MED ORDER — CANAGLIFLOZIN 100 MG PO TABS
100.0000 mg | ORAL_TABLET | Freq: Every morning | ORAL | Status: DC
  Administered 2023-07-25: 100 mg via ORAL
  Filled 2023-07-24: qty 1

## 2023-07-24 MED ORDER — HEPARIN SODIUM (PORCINE) 5000 UNIT/ML IJ SOLN
5000.0000 [IU] | Freq: Two times a day (BID) | INTRAMUSCULAR | Status: DC
  Administered 2023-07-24 – 2023-07-25 (×2): 5000 [IU] via SUBCUTANEOUS
  Filled 2023-07-24 (×2): qty 1

## 2023-07-24 MED ORDER — DOCUSATE SODIUM 100 MG PO CAPS
100.0000 mg | ORAL_CAPSULE | Freq: Two times a day (BID) | ORAL | Status: DC
  Administered 2023-07-24 – 2023-07-25 (×2): 100 mg via ORAL
  Filled 2023-07-24 (×2): qty 1

## 2023-07-24 MED ORDER — ATROPINE SULFATE 1 MG/10ML IJ SOSY
0.5000 mg | PREFILLED_SYRINGE | Freq: Once | INTRAMUSCULAR | Status: DC | PRN
  Filled 2023-07-24: qty 10

## 2023-07-24 MED ORDER — DIVALPROEX SODIUM 125 MG PO DR TAB: 125.0000 mg | DELAYED_RELEASE_TABLET | Freq: Every day | ORAL | Status: DC

## 2023-07-24 MED ORDER — GLIMEPIRIDE 2 MG PO TABS: 2.0000 mg | ORAL_TABLET | Freq: Every day | ORAL | Status: DC

## 2023-07-24 MED ORDER — GABAPENTIN 300 MG PO CAPS: 300.0000 mg | ORAL_CAPSULE | Freq: Three times a day (TID) | ORAL | Status: DC

## 2023-07-24 MED ORDER — MAGNESIUM OXIDE 400 MG PO TABS
200.0000 mg | ORAL_TABLET | Freq: Every day | ORAL | Status: DC
  Administered 2023-07-25: 200 mg via ORAL
  Filled 2023-07-24: qty 1
  Filled 2023-07-24: qty 0.5

## 2023-07-24 NOTE — ED Notes (Signed)
 Attempt to call Dr. Chipper Herb, Secure chat sent in regards to pt HR dropping to the high 30's low 40's. Pt O2 saturations maintaining high 90's.

## 2023-07-24 NOTE — ED Notes (Signed)
 Pt was able to ambulate to restroom using her walker from home. Pt had steady gate and returned to bed without incident. Pt is currently at CT.

## 2023-07-24 NOTE — ED Notes (Signed)
 Pt placed on purewick

## 2023-07-24 NOTE — ED Triage Notes (Addendum)
 Pt comes via EMS from [place]. Pt had 3 episodes at church with tremors and her eyes rolled back while in Sunday School class. Pt states she doesn't remember anything and denies any kind of pain.   Pt denies any cp, sob or dizziness. Pt states she feels fine now and hasn't been sick. Pt also just had PCP visit yesterday per family and everything checked out ok.

## 2023-07-24 NOTE — H&P (Addendum)
 History and Physical    Haley Mayer NWG:[id number] DOB: [birth year] DOA: 07/24/2023  PCP: Sherron Monday, MD (Confirm with patient/family/NH records and if not entered, this has to be entered at [facility] point of entry) Patient coming from: Home  I have personally briefly reviewed patient's old medical records in [facility]  Chief Complaint: I feel fine  HPI: Haley Mayer is a 89 y.o. female with medical history significant of type I second-degree AV block 2016, HTN, HLD, CKD stage IIIb, IIDM, Parkinson's disease presented with syncope  Patient was at church and sitting, and bystander at her side found that she suddenly falls and staring forward and unresponsive for about 3 to 5 seconds, no fall.  She recovered consciousness herself but then soon she developed another 2 similar episode.  No loss control urine or bowel movement.  Patient denied any prodromes of lightheadedness blurry vision palpitations.  ED Course: Sinus rhythm, blood pressure 140/50, O2 saturation 100% on room air on telemonitoring ED patient has had episode of bradycardia and sinus pauses.  CT head negative for acute findings.  EKG showed Mobitz type II second-degree AV block versus third-degree AV block.  Blood work showed sodium 132, potassium 5.4, creatinine 1.4 BUN 32.  WBC 2.8, hemoglobin 13.4.  Review of Systems: As per HPI otherwise 14 point review of systems negative.    Past Medical History:  Diagnosis Date   Arthritis    Chronic kidney disease    function no 100% per patient    Diabetes mellitus without complication (HCC)    Type II   Glaucoma    Hyperlipidemia    Hypertension     Past Surgical History:  Procedure Laterality Date   ABDOMINAL HYSTERECTOMY     APPENDECTOMY     CATARACT EXTRACTION W/PHACO Right 04/01/2021   Procedure: CATARACT EXTRACTION PHACO AND INTRAOCULAR LENS PLACEMENT (IOC) RIGHT DIABETIC KAHOOK DUAL BLADE GOINIOTOMY 10.28 01.35.6;  Surgeon: Lockie Mola, MD;   Location: [facility];  Service: Ophthalmology;  Laterality: Right;   CATARACT EXTRACTION W/PHACO Left 04/22/2021   Procedure: CATARACT EXTRACTION PHACO AND INTRAOCULAR LENS PLACEMENT (IOC) LEFT DIABETIC OMNI CANALOPLASTY AND TRABECULOTOMY 14.83 02:02.9;  Surgeon: Lockie Mola, MD;  Location: [facility];  Service: Ophthalmology;  Laterality: Left;  leave last case   CHOLECYSTECTOMY     OOPHORECTOMY     PARTIAL KNEE ARTHROPLASTY Left 08/07/2014   PARTIAL KNEE ARTHROPLASTY Left 08/07/2014   Procedure: LEFT MEDIAL PARTIAL KNEE REPLACEMENT ;  Surgeon: Samson Frederic, MD;  Location: [facility] OR;  Service: Orthopedics;  Laterality: Left;   TONSILLECTOMY       reports that she has never smoked. She has never used smokeless tobacco. She reports that she does not drink alcohol and does not use drugs.  Allergies  Allergen Reactions   Aspirin Nausea And Vomiting    REGULAR STRENGTH= Also upset stomach    Enalapril Maleate Other (See Comments)    angioedema   Iodinated Contrast Media Other (See Comments)    Vomiting and hives   Iohexol Nausea And Vomiting   Loperamide Hives   Other Other (See Comments)    Tomatoes--acid   Primidone Other (See Comments)    unknown unknown   Sulfa Antibiotics Other (See Comments)    Vomiting and hives   Vasotec [Enalapril]     unknown    Family History  Problem Relation Age of Onset   Breast cancer Mother 29     Prior to Admission medications  Medication Sig Start Date End Date Taking? Authorizing Provider  amLODipine (NORVASC) 10 MG tablet TAKE 1 TABLET BY MOUTH ONCE  DAILY 05/16/23  Yes Sherron Monday, MD  aspirin EC 81 MG tablet Take 1 tablet (81 mg total) by mouth 2 (two) times daily after a meal. 08/09/14  Yes Swinteck, Arlys John, MD  Calcium Carbonate-Vitamin D 600-400 MG-UNIT tablet Take by mouth.   Yes [provider]  canagliflozin (INVOKANA) 100 MG TABS tablet Take 1 tablet (100 mg total) by mouth every morning.  07/18/23  Yes Tejan-Sie, Marcelino Freestone, MD  simvastatin (ZOCOR) 10 MG tablet TAKE 1 TABLET BY MOUTH EVERY  NIGHT AT BEDTIME 06/03/23  Yes Tejan-Sie, Marcelino Freestone, MD  valsartan-hydrochlorothiazide (DIOVAN-HCT) 320-12.5 MG tablet TAKE 1 TABLET BY MOUTH IN THE  MORNING 08/26/22  Yes Boswell, Gareth Morgan, NP  acetaminophen (TYLENOL) 500 MG tablet Take by mouth.    [provider]  bimatoprost (LUMIGAN) 0.01 % SOLN Place 1 drop into both eyes at bedtime.    [provider]  Calcium Carbonate-Vit D-Min (CALCIUM 1200 PO) Take 1 tablet by mouth every morning.    [provider]  cholecalciferol (VITAMIN D) 1000 UNITS tablet Take 1,000 Units by mouth every morning.     [provider]  diphenhydrAMINE (BENADRYL) 25 MG tablet Take 50 mg by mouth every 6 (six) hours as needed for itching or allergies.    [provider]  divalproex (DEPAKOTE) 125 MG DR tablet Take 125 mg by mouth daily. 07/02/22   [provider]  docusate sodium (COLACE) 100 MG capsule Take 1 capsule (100 mg total) by mouth 2 (two) times daily. 08/09/14   Swinteck, Arlys John, MD  gabapentin (NEURONTIN) 300 MG capsule Take 300 mg by mouth 3 (three) times daily. 01/12/20   [provider]  glimepiride (AMARYL) 2 MG tablet Take by mouth. 12/17/19   [provider]  latanoprost (XALATAN) 0.005 % ophthalmic solution  05/28/16   [provider]  magnesium oxide (MAG-OX) 400 (241.3 Mg) MG tablet  05/04/16   [provider]  magnesium oxide (MAG-OX) 400 MG tablet Take by mouth. 01/11/20   [provider]  multivitamin-iron-minerals-folic acid (CENTRUM) chewable tablet Chew 1 tablet by mouth every morning.     [provider]  OneTouch Delica Lancets 33G MISC Apply topically. 02/21/20   [provider]  [facility] ULTRA test strip 1 each daily. 02/21/20   [provider]  senna (SENOKOT) 8.6 MG TABS tablet Take 2 tablets (17.2 mg total) by mouth at bedtime.  08/09/14   Samson Frederic, MD    Physical Exam: Vitals:   07/24/23 1050 07/24/23 1311 07/24/23 1430 07/24/23 1550  BP: (!) 146/56  (!) 146/69 129/65  Pulse: 60   67  Resp: 18  19 17   Temp: 98.1 F (36.7 C)     SpO2: 100% 100%  100%    Constitutional: NAD, calm, comfortable Vitals:   07/24/23 1050 07/24/23 1311 07/24/23 1430 07/24/23 1550  BP: (!) 146/56  (!) 146/69 129/65  Pulse: 60   67  Resp: 18  19 17   Temp: 98.1 F (36.7 C)     SpO2: 100% 100%  100%   Eyes: PERRL, lids and conjunctivae normal ENMT: Mucous membranes are moist. Posterior pharynx clear of any exudate or lesions.Normal dentition.  Neck: normal, supple, no masses, no thyromegaly Respiratory: clear to auscultation bilaterally, no wheezing, no crackles. Normal respiratory effort. No accessory muscle use.  Cardiovascular: Regular rate and rhythm, no  murmurs / rubs / gallops. No extremity edema. 2+ pedal pulses. No carotid bruits.  Abdomen: no tenderness, no masses palpated. No hepatosplenomegaly. Bowel sounds positive.  Musculoskeletal: no clubbing / cyanosis. No joint deformity upper and lower extremities. Good ROM, no contractures. Normal muscle tone.  Skin: no rashes, lesions, ulcers. No induration Neurologic: CN 2-12 grossly intact. Sensation intact, DTR normal. Strength 5/5 in all 4.  Psychiatric: Normal judgment and insight. Alert and oriented x 3. Normal mood.     Labs on Admission: I have personally reviewed following labs and imaging studies  CBC: Recent Labs  Lab 07/24/23 1128  WBC 2.8*  HGB 13.4  HCT 39.9  MCV 96.1  PLT 152   Basic Metabolic Panel: Recent Labs  Lab 07/24/23 1128  NA 132*  K 5.4*  CL 100  CO2 24  GLUCOSE 115*  BUN 32*  CREATININE 1.44*  CALCIUM 9.5  MG 2.3   GFR: Estimated Creatinine Clearance: 24.1 mL/min (A) (by C-G formula based on SCr of 1.44 mg/dL (H)). Liver Function Tests: No results for input(s): "AST", "ALT", "ALKPHOS", "BILITOT", "PROT", "ALBUMIN" in  the last 168 hours. No results for input(s): "LIPASE", "AMYLASE" in the last 168 hours. No results for input(s): "AMMONIA" in the last 168 hours. Coagulation Profile: No results for input(s): "INR", "PROTIME" in the last 168 hours. Cardiac Enzymes: No results for input(s): "CKTOTAL", "CKMB", "CKMBINDEX", "TROPONINI" in the last 168 hours. BNP (last 3 results) No results for input(s): "PROBNP" in the last 8760 hours. HbA1C: No results for input(s): "HGBA1C" in the last 72 hours. CBG: No results for input(s): "GLUCAP" in the last 168 hours. Lipid Profile: No results for input(s): "CHOL", "HDL", "LDLCALC", "TRIG", "CHOLHDL", "LDLDIRECT" in the last 72 hours. Thyroid Function Tests: No results for input(s): "TSH", "T4TOTAL", "FREET4", "T3FREE", "THYROIDAB" in the last 72 hours. Anemia Panel: No results for input(s): "VITAMINB12", "FOLATE", "FERRITIN", "TIBC", "IRON", "RETICCTPCT" in the last 72 hours. Urine analysis:    Component Value Date/Time   COLORURINE YELLOW (A) 07/24/2023 1343   APPEARANCEUR CLEAR (A) 07/24/2023 1343   LABSPEC 1.010 07/24/2023 1343   PHURINE 6.0 07/24/2023 1343   GLUCOSEU >=500 (A) 07/24/2023 1343   HGBUR NEGATIVE 07/24/2023 1343   BILIRUBINUR NEGATIVE 07/24/2023 1343   KETONESUR NEGATIVE 07/24/2023 1343   PROTEINUR NEGATIVE 07/24/2023 1343   UROBILINOGEN 0.2 07/09/2014 1051   NITRITE NEGATIVE 07/24/2023 1343   LEUKOCYTESUR NEGATIVE 07/24/2023 1343    Radiological Exams on Admission: CT Head Wo Contrast Result Date: 07/24/2023 CLINICAL DATA:  Syncope. EXAM: CT HEAD WITHOUT CONTRAST TECHNIQUE: Contiguous axial images were obtained from the base of the skull through the vertex without intravenous contrast. RADIATION DOSE REDUCTION: This exam was performed according to the departmental dose-optimization program which includes automated exposure control, adjustment of the mA and/or kV according to patient size and/or use of iterative reconstruction technique.  COMPARISON:  11/18/2021 FINDINGS: Brain: No evidence of intracranial hemorrhage, acute infarction, hydrocephalus, extra-axial collection, or mass lesion/mass effect. Mild chronic small vessel disease, without significant change. Vascular:  No hyperdense vessel or other acute findings. Skull: No evidence of fracture or other significant bone abnormality. Sinuses/Orbits:  No acute findings. Other: None. IMPRESSION: No acute intracranial abnormality. Mild chronic small vessel disease. Electronically Signed   By: Danae Orleans M.D.   On: 07/24/2023 15:19    EKG: Independently reviewed.  Sinus rhythm, second-degree AV block with sinus pauses  Assessment/Plan Principal Problem:   Syncope Active Problems:   Second degree AV block  Syncope, cardiogenic  (please populate well all problems here in Problem List. (For example, if patient is on BP meds at home and you resume or decide to hold them, it is a problem that needs to be her. Same for CAD, COPD, HLD and so on)  Cardiogenic syncope Mobitz type II second-degree AV block -Admitted to PCU -Patient remains asymptomatic during ED stay, will hold off atropine. -Messaged cardiology for possible PPM evaluation -Discussed with patient and her family at bedside, patient prefers full code. Check TSH and cortisol level -Other DDx, low suspicion for seizure disorder  HTN -Hold off all BP meds -Start as needed hydralazine  IIDM -No history of hypoglycemia recently -Check A1c -SSI for now  CKD stage IIIb -Euvolemic and creatinine level stable  Parkinson's disease -Outpatient follow-up with neurology  DVT prophylaxis: Heparin subcu Code Status: Full code Family Communication: Daughter at bedside Disposition Plan: Patient is sick with advanced AV block and syncope requiring inpatient pacemaker evaluation, expect more than 2 midnight hospital stay Consults called: Cardiology Admission status: PCU admit   Emeline General MD [facility] Pager [phone]  07/24/2023, 4:16 PM

## 2023-07-24 NOTE — ED Provider Notes (Signed)
 [facility] Provider Note    Event Date/Time   First MD Initiated Contact with Patient 07/24/23 1230     (approximate)   History   Tremors   HPI  Haley Mayer is a 89 y.o. female past with history significant for CKD, diabetes, hypertension, hyperlipidemia, arthritis, who presents to the emergency department with episode of syncope.  Patient had 3 episodes of witnessed syncope while at church today.  Patient states that she felt like she may need to go to the bathroom but does not recall the events of today.  States that she was in Sunday school and then her eyes rolled back in her head and she had tremors and shaking, episode lasted for approximately 1 minute and then quickly resolved.  Had 2 other episodes that immediately followed.  She does not recall the events of this.  No head injury or loss of consciousness.  Denies any history of seizure.  No new medications.  Denies chest pain, nausea, vomiting.  No preceding dizziness or lightheadedness.     Physical Exam   Triage Vital Signs: ED Triage Vitals  Encounter Vitals Group     BP 07/24/23 1050 (!) 146/56     Systolic BP Percentile --      Diastolic BP Percentile --      Pulse Rate 07/24/23 1050 60     Resp 07/24/23 1050 18     Temp 07/24/23 1050 98.1 F (36.7 C)     Temp src --      SpO2 07/24/23 1050 100 %     Weight --      Height --      Head Circumference --      Peak Flow --      Pain Score 07/24/23 1048 0     Pain Loc --      Pain Education --      Exclude from Growth Chart --     Most recent vital signs: Vitals:   07/25/23 0935 07/25/23 1000  BP:  (!) 138/57  Pulse:  (!) 59  Resp:  (!) 22  Temp: 97.7 F (36.5 C)   SpO2:  100%    Physical Exam Constitutional:      Appearance: She is well-developed.  HENT:     Head: Atraumatic.  Eyes:     Extraocular Movements: Extraocular movements intact.     Conjunctiva/sclera: Conjunctivae normal.     Pupils: Pupils are equal,  round, and reactive to light.  Cardiovascular:     Rate and Rhythm: Regular rhythm.     Heart sounds: No murmur heard. Pulmonary:     Effort: No respiratory distress.  Abdominal:     General: There is no distension.     Tenderness: There is no abdominal tenderness.  Musculoskeletal:        General: Normal range of motion.     Cervical back: Normal range of motion.  Skin:    General: Skin is warm.  Neurological:     Mental Status: She is alert. Mental status is at baseline.     GCS: GCS eye subscore is 4. GCS verbal subscore is 5. GCS motor subscore is 6.     Cranial Nerves: Cranial nerves 2-12 are intact.     Sensory: Sensation is intact.     Motor: Motor function is intact.     Coordination: Coordination is intact.     IMPRESSION / MDM / ASSESSMENT AND PLAN / ED COURSE  I reviewed the triage vital signs and the nursing notes.  Differential diagnosis including syncope, seizure, vasovagal episode, intracranial hemorrhage, ACS, anemia, viral illness including COVID/influenza  EKG  I, Corena Herter, the attending physician, personally viewed and interpreted this ECG.  EKG with 2 long sinus pauses.  No significant ST elevation or depression.  Left bundle branch block.  No significant change compared to prior EKG.   Repeat EKG obtained at time of admission - concern for 2nd degree type II - discussed with hospitalist who will discuss with cardiology on call.  Currently normotensive.   RADIOLOGY CT head read as no acute findings.  LABS (all labs ordered are listed, but only abnormal results are displayed) Labs interpreted as -    Labs Reviewed  CBC - Abnormal; Notable for the following components:      Result Value   WBC 2.8 (*)    All other components within normal limits  BASIC METABOLIC PANEL - Abnormal; Notable for the following components:   Sodium 132 (*)    Potassium 5.4 (*)    Glucose, Bld 115 (*)    BUN 32 (*)    Creatinine, Ser 1.44 (*)    GFR, Estimated 34  (*)    All other components within normal limits  URINALYSIS, W/ REFLEX TO CULTURE (INFECTION SUSPECTED) - Abnormal; Notable for the following components:   Color, Urine YELLOW (*)    APPearance CLEAR (*)    Glucose, UA >=500 (*)    Bacteria, UA RARE (*)    All other components within normal limits  CBC WITH DIFFERENTIAL/PLATELET - Abnormal; Notable for the following components:   WBC 2.9 (*)    Platelets 144 (*)    Neutro Abs 1.3 (*)    All other components within normal limits  BASIC METABOLIC PANEL - Abnormal; Notable for the following components:   BUN 28 (*)    Creatinine, Ser 1.24 (*)    GFR, Estimated 41 (*)    All other components within normal limits  CBG MONITORING, ED - Abnormal; Notable for the following components:   Glucose-Capillary 110 (*)    All other components within normal limits  CBG MONITORING, ED - Abnormal; Notable for the following components:   Glucose-Capillary 106 (*)    All other components within normal limits  RESP PANEL BY RT-PCR (RSV, FLU A&B, COVID)  RVPGX2  MAGNESIUM  TSH  PHOSPHORUS  CORTISOL  CBG MONITORING, ED  TROPONIN I (HIGH SENSITIVITY)  TROPONIN I (HIGH SENSITIVITY)     MDM  Patient with leukopenia that appears to be chronic.  Creatinine appears to be at baseline.  Does have a mildly elevated potassium which also appears to be chronically elevated.  Potassium 5.4.  Mild hyponatremia at 132.  Initial troponin is negative.  No signs of urinary tract infection.  CT scan of the head with no signs of intracranial hemorrhage and patient has a nonfocal exam, no concern for CVA.  Have a low suspicion for seizure.  Given 3 syncopal episodes today concern for possible dysrhythmia, high risk based on age and risk factors, consulted hospitalist for admission prior syncope.  No medications on review to cause bradycardia.  Repeat EKG at time of admission for decreased HR - concern for 2nd degree, type II - Hospitalist team to reach out to  cardiology to discuss possible PPM.        PROCEDURES:  Critical Care performed: yes  .Critical Care  Performed by: Corena Herter, MD Authorized by: Arnoldo Morale  Carollee Herter, MD   Critical care provider statement:    Critical care time (minutes):  30   Critical care was necessary to treat or prevent imminent or life-threatening deterioration of the following conditions:  Cardiac failure   Critical care was time spent personally by me on the following activities:  Development of treatment plan with patient or surrogate, discussions with consultants, evaluation of patient's response to treatment, examination of patient, ordering and review of laboratory studies, ordering and review of radiographic studies, ordering and performing treatments and interventions, pulse oximetry, re-evaluation of patient's condition and review of old charts   Patient's presentation is most consistent with acute presentation with potential threat to life or bodily function.   MEDICATIONS ORDERED IN ED: Medications  acetaminophen (TYLENOL) tablet 650 mg (has no administration in time range)  aspirin EC tablet 81 mg (81 mg Oral Given 07/24/23 1643)  simvastatin (ZOCOR) tablet 10 mg (10 mg Oral Given 07/24/23 2209)  hydrALAZINE (APRESOLINE) injection 5 mg (has no administration in time range)  canagliflozin (INVOKANA) tablet 100 mg (has no administration in time range)  docusate sodium (COLACE) capsule 100 mg (100 mg Oral Given 07/24/23 2210)  magnesium oxide (MAG-OX) tablet 200 mg (has no administration in time range)  senna (SENOKOT) tablet 17.2 mg (17.2 mg Oral Given 07/24/23 2210)  diphenhydrAMINE (BENADRYL) tablet 50 mg (has no administration in time range)  latanoprost (XALATAN) 0.005 % ophthalmic solution 1 drop (1 drop Both Eyes Given 07/24/23 2211)  sodium chloride flush (NS) 0.9 % injection 3 mL (3 mLs Intravenous Given 07/24/23 2211)  heparin injection 5,000 Units (5,000 Units Subcutaneous Given 07/24/23 2212)  insulin  aspart (novoLOG) injection 0-9 Units ( Subcutaneous Not Given 07/25/23 0830)  atropine 1 MG/10ML injection 0.5 mg (has no administration in time range)  traZODone (DESYREL) tablet 25 mg (has no administration in time range)    FINAL CLINICAL IMPRESSION(S) / ED DIAGNOSES   Final diagnoses:  Syncope, unspecified syncope type  AV block, Mobitz II     Rx / DC Orders   ED Discharge Orders     None        Note:  This document was prepared using Dragon voice recognition software and may include unintentional dictation errors.   Corena Herter, MD 07/24/23 1539    Corena Herter, MD 07/25/23 1039

## 2023-07-24 NOTE — ED Notes (Signed)
 Lab called and on the way to collect blood.

## 2023-07-25 ENCOUNTER — Inpatient Hospital Stay (HOSPITAL_COMMUNITY)
Admission: EM | Admit: 2023-07-25 | Discharge: 2023-07-27 | DRG: 243 | Disposition: A | Discharge status: Home / Self Care | Source: Other Acute Inpatient Hospital | Attending: Internal Medicine | Admitting: Internal Medicine

## 2023-07-25 ENCOUNTER — Other Ambulatory Visit: Payer: Self-pay

## 2023-07-25 ENCOUNTER — Encounter (HOSPITAL_COMMUNITY): Payer: Self-pay | Admitting: Internal Medicine

## 2023-07-25 ENCOUNTER — Telehealth: Payer: Self-pay

## 2023-07-25 ENCOUNTER — Inpatient Hospital Stay (HOSPITAL_COMMUNITY)
Admit: 2023-07-25 | Discharge: 2023-07-25 | Disposition: A | Discharge status: Home / Self Care | Attending: Internal Medicine | Admitting: Internal Medicine

## 2023-07-25 ENCOUNTER — Encounter (HOSPITAL_COMMUNITY): Payer: Self-pay

## 2023-07-25 DIAGNOSIS — I129 Hypertensive chronic kidney disease with stage 1 through stage 4 chronic kidney disease, or unspecified chronic kidney disease: Secondary | ICD-10-CM | POA: Diagnosis present

## 2023-07-25 DIAGNOSIS — D72819 Decreased white blood cell count, unspecified: Secondary | ICD-10-CM | POA: Diagnosis present

## 2023-07-25 DIAGNOSIS — R55 Syncope and collapse: Secondary | ICD-10-CM | POA: Diagnosis present

## 2023-07-25 DIAGNOSIS — E875 Hyperkalemia: Secondary | ICD-10-CM | POA: Diagnosis present

## 2023-07-25 DIAGNOSIS — G20A1 Parkinson's disease without dyskinesia, without mention of fluctuations: Secondary | ICD-10-CM | POA: Diagnosis present

## 2023-07-25 DIAGNOSIS — Z91041 Radiographic dye allergy status: Secondary | ICD-10-CM | POA: Diagnosis not present

## 2023-07-25 DIAGNOSIS — N183 Chronic kidney disease, stage 3 unspecified: Secondary | ICD-10-CM | POA: Diagnosis present

## 2023-07-25 DIAGNOSIS — Z90710 Acquired absence of both cervix and uterus: Secondary | ICD-10-CM | POA: Diagnosis not present

## 2023-07-25 DIAGNOSIS — R001 Bradycardia, unspecified: Secondary | ICD-10-CM | POA: Diagnosis present

## 2023-07-25 DIAGNOSIS — Z96652 Presence of left artificial knee joint: Secondary | ICD-10-CM | POA: Diagnosis present

## 2023-07-25 DIAGNOSIS — I441 Atrioventricular block, second degree: Principal | ICD-10-CM | POA: Diagnosis present

## 2023-07-25 DIAGNOSIS — Z6830 Body mass index (BMI) 30.0-30.9, adult: Secondary | ICD-10-CM

## 2023-07-25 DIAGNOSIS — E669 Obesity, unspecified: Secondary | ICD-10-CM | POA: Diagnosis present

## 2023-07-25 DIAGNOSIS — Z79899 Other long term (current) drug therapy: Secondary | ICD-10-CM | POA: Diagnosis not present

## 2023-07-25 DIAGNOSIS — Z888 Allergy status to other drugs, medicaments and biological substances status: Secondary | ICD-10-CM

## 2023-07-25 DIAGNOSIS — Z7982 Long term (current) use of aspirin: Secondary | ICD-10-CM | POA: Diagnosis not present

## 2023-07-25 DIAGNOSIS — E871 Hypo-osmolality and hyponatremia: Secondary | ICD-10-CM | POA: Diagnosis present

## 2023-07-25 DIAGNOSIS — Z882 Allergy status to sulfonamides status: Secondary | ICD-10-CM | POA: Diagnosis not present

## 2023-07-25 DIAGNOSIS — E1122 Type 2 diabetes mellitus with diabetic chronic kidney disease: Secondary | ICD-10-CM | POA: Diagnosis present

## 2023-07-25 DIAGNOSIS — N184 Chronic kidney disease, stage 4 (severe): Secondary | ICD-10-CM | POA: Diagnosis present

## 2023-07-25 DIAGNOSIS — E785 Hyperlipidemia, unspecified: Secondary | ICD-10-CM | POA: Diagnosis present

## 2023-07-25 DIAGNOSIS — N1832 Chronic kidney disease, stage 3b: Secondary | ICD-10-CM | POA: Diagnosis present

## 2023-07-25 DIAGNOSIS — E1169 Type 2 diabetes mellitus with other specified complication: Secondary | ICD-10-CM | POA: Diagnosis present

## 2023-07-25 DIAGNOSIS — I447 Left bundle-branch block, unspecified: Secondary | ICD-10-CM | POA: Diagnosis present

## 2023-07-25 DIAGNOSIS — Z886 Allergy status to analgesic agent status: Secondary | ICD-10-CM | POA: Diagnosis not present

## 2023-07-25 DIAGNOSIS — I10 Essential (primary) hypertension: Secondary | ICD-10-CM | POA: Diagnosis present

## 2023-07-25 DIAGNOSIS — Z803 Family history of malignant neoplasm of breast: Secondary | ICD-10-CM | POA: Diagnosis not present

## 2023-07-25 DIAGNOSIS — Z1152 Encounter for screening for COVID-19: Secondary | ICD-10-CM

## 2023-07-25 LAB — CBC WITH DIFFERENTIAL/PLATELET
Abs Immature Granulocytes: 0 10*3/uL (ref 0.00–0.07)
Basophils Absolute: 0 10*3/uL (ref 0.0–0.1)
Basophils Relative: 0 %
Eosinophils Absolute: 0.1 10*3/uL (ref 0.0–0.5)
Eosinophils Relative: 2 %
HCT: 38.8 % (ref 36.0–46.0)
Hemoglobin: 13.2 g/dL (ref 12.0–15.0)
Immature Granulocytes: 0 %
Lymphocytes Relative: 40 %
Lymphs Abs: 1.2 10*3/uL (ref 0.7–4.0)
MCH: 32.4 pg (ref 26.0–34.0)
MCHC: 34 g/dL (ref 30.0–36.0)
MCV: 95.1 fL (ref 80.0–100.0)
Monocytes Absolute: 0.4 10*3/uL (ref 0.1–1.0)
Monocytes Relative: 15 %
Neutro Abs: 1.3 10*3/uL — ABNORMAL LOW (ref 1.7–7.7)
Neutrophils Relative %: 43 %
Platelets: 144 10*3/uL — ABNORMAL LOW (ref 150–400)
RBC: 4.08 MIL/uL (ref 3.87–5.11)
RDW: 13.3 % (ref 11.5–15.5)
WBC: 2.9 10*3/uL — ABNORMAL LOW (ref 4.0–10.5)
nRBC: 0 % (ref 0.0–0.2)

## 2023-07-25 LAB — GLUCOSE, CAPILLARY: Glucose-Capillary: 137 mg/dL — ABNORMAL HIGH (ref 70–99)

## 2023-07-25 LAB — BASIC METABOLIC PANEL
Anion gap: 8 (ref 5–15)
BUN: 28 mg/dL — ABNORMAL HIGH (ref 8–23)
CO2: 25 mmol/L (ref 22–32)
Calcium: 8.9 mg/dL (ref 8.9–10.3)
Chloride: 102 mmol/L (ref 98–111)
Creatinine, Ser: 1.24 mg/dL — ABNORMAL HIGH (ref 0.44–1.00)
GFR, Estimated: 41 mL/min — ABNORMAL LOW (ref >60)
Glucose, Bld: 92 mg/dL (ref 70–99)
Potassium: 4 mmol/L (ref 3.5–5.1)
Sodium: 135 mmol/L (ref 135–145)

## 2023-07-25 LAB — VITAMIN B12: Vitamin B-12: 795 pg/mL (ref 180–914)

## 2023-07-25 LAB — CBG MONITORING, ED
Glucose-Capillary: 74 mg/dL (ref 70–99)
Glucose-Capillary: 117 mg/dL — ABNORMAL HIGH (ref 70–99)
Glucose-Capillary: 66 mg/dL — ABNORMAL LOW (ref 70–99)

## 2023-07-25 MED ORDER — INSULIN ASPART 100 UNIT/ML IJ SOLN: 0.0000 [IU] | Freq: Three times a day (TID) | INTRAMUSCULAR | Status: DC

## 2023-07-25 MED ORDER — DEXTROSE 50 % IV SOLN
12.5000 g | Freq: Once | INTRAVENOUS | Status: AC
  Administered 2023-07-25: 12.5 g via INTRAVENOUS
  Filled 2023-07-25: qty 50

## 2023-07-25 MED ORDER — HYDROCHLOROTHIAZIDE 12.5 MG PO TABS
12.5000 mg | ORAL_TABLET | Freq: Every day | ORAL | Status: DC
  Administered 2023-07-25: 12.5 mg via ORAL
  Filled 2023-07-25: qty 1

## 2023-07-25 MED ORDER — ACETAMINOPHEN 325 MG PO TABS: 650.0000 mg | ORAL_TABLET | Freq: Four times a day (QID) | ORAL | Status: DC | PRN

## 2023-07-25 MED ORDER — ONDANSETRON HCL 4 MG PO TABS: 4.0000 mg | ORAL_TABLET | Freq: Four times a day (QID) | ORAL | Status: DC | PRN

## 2023-07-25 MED ORDER — VALSARTAN-HYDROCHLOROTHIAZIDE 320-12.5 MG PO TABS: 1.0000 | ORAL_TABLET | Freq: Every morning | ORAL | Status: DC

## 2023-07-25 MED ORDER — ACETAMINOPHEN 650 MG RE SUPP: 650.0000 mg | Freq: Four times a day (QID) | RECTAL | Status: DC | PRN

## 2023-07-25 MED ORDER — HYDRALAZINE HCL 20 MG/ML IJ SOLN: 5.0000 mg | Freq: Four times a day (QID) | INTRAMUSCULAR | Status: DC | PRN

## 2023-07-25 MED ORDER — DOCUSATE SODIUM 100 MG PO CAPS
100.0000 mg | ORAL_CAPSULE | Freq: Two times a day (BID) | ORAL | Status: DC
  Administered 2023-07-25 – 2023-07-26 (×2): 100 mg via ORAL
  Filled 2023-07-25 (×2): qty 1

## 2023-07-25 MED ORDER — HEPARIN SODIUM (PORCINE) 5000 UNIT/ML IJ SOLN
5000.0000 [IU] | Freq: Three times a day (TID) | INTRAMUSCULAR | Status: DC
  Administered 2023-07-25 – 2023-07-27 (×4): 5000 [IU] via SUBCUTANEOUS
  Filled 2023-07-25 (×4): qty 1

## 2023-07-25 MED ORDER — ONDANSETRON HCL 4 MG/2ML IJ SOLN: 4.0000 mg | Freq: Four times a day (QID) | INTRAMUSCULAR | Status: DC | PRN

## 2023-07-25 MED ORDER — IRBESARTAN 150 MG PO TABS
300.0000 mg | ORAL_TABLET | Freq: Every day | ORAL | Status: DC
  Administered 2023-07-25: 300 mg via ORAL
  Filled 2023-07-25: qty 2

## 2023-07-25 MED ORDER — LACTATED RINGERS IV SOLN: INTRAVENOUS | Status: AC

## 2023-07-25 MED ORDER — SIMVASTATIN 20 MG PO TABS
10.0000 mg | ORAL_TABLET | Freq: Every day | ORAL | Status: DC
  Administered 2023-07-25 – 2023-07-26 (×2): 10 mg via ORAL
  Filled 2023-07-25 (×2): qty 1

## 2023-07-25 MED ORDER — TRAZODONE HCL 50 MG PO TABS: 25.0000 mg | ORAL_TABLET | Freq: Every evening | ORAL | Status: DC | PRN

## 2023-07-25 NOTE — ED Notes (Signed)
 Pt CBG 66. Hypoglcemia Algorithm initiated.

## 2023-07-25 NOTE — Progress Notes (Signed)
*  PRELIMINARY RESULTS* Echocardiogram 2D Echocardiogram has been performed.  Carolyne Fiscal 07/25/2023, 3:06 PM

## 2023-07-25 NOTE — Progress Notes (Signed)
 OT Cancellation Note  Patient Details Name: Haley Mayer MRN: [id number] DOB: 03/13/1930   Cancelled Treatment:    Reason Eval/Treat Not Completed: OT screened, no needs identified, will sign off. Per chart review, pt is at baseline level of functioning for self care and mobility tasks. OT to complete orders at this time.   Jackquline Denmark, MS, OTR/L , CBIS ascom [phone]  07/25/23, 3:08 PM

## 2023-07-25 NOTE — ED Notes (Signed)
 Assumed patient care at approximately 0715 and received report from the previous nurse.

## 2023-07-25 NOTE — Consult Note (Signed)
   Electrophysiology Consultation   Patient ID: Haley Mayer MRN: [id number]; DOB: 08/21/1929  Admit date: 07/25/2023 Date of Consult: 07/25/2023  PCP:  Haley Monday, MD   History of Present Illness:   Ms. Haley Mayer is a 89 year old woman who I am seeing today for an evaluation of syncope at the request of Dr. Flora Mayer.  The patient has a history of left bundle-branch block, hyperlipidemia, hypertension, CKD and type 2 diabetes.  According to her daughter and family in the room, she was seated at church when she experienced 3 syncopal episodes back-to-back.  The daughter confirms multiple times that she was seated at the time of the syncopal episode.  Bystanders saw her eyes rolled back in her head and then her whole body twitch.  The symptoms lasted for a few seconds and then she slowly regained consciousness.  After the third episode, she slowly returned to her normal self.  She denies feeling poorly recently.  No changes in medications recently.  She denies other syncopal episodes.  She was seen at [facility] by cardiology who diagnosed high degree AV block.  There were no reversible causes identified.  She was transferred to [facility] for possible pacemaker implant.  At the time of my evaluation she is without complaint.  Family is at the bedside.   Past medical, surgical, social and family history reviewed.  ROS:  Please see the history of present illness.  All other ROS reviewed and negative.     Physical Exam/Data:   Vitals:   07/25/23 1710  BP: (!) 167/60  Pulse: 69  Resp: 16  Temp: 97.7 F (36.5 C)  TempSrc: Oral  SpO2: 100%    General:  Well nourished, well developed, in no acute distress.  Elderly. Cardiac:  normal S1, S2; RRR; no murmur  Lungs:  clear to auscultation bilaterally, no wheezing, rhonchi or rales  Psych:  Normal affect   EKG:  The EKG was personally reviewed and demonstrates: Mobitz 2 AV block.  Left bundle branch block.  First-degree AV  delay.  Sinus rhythm. Telemetry:  Telemetry was personally reviewed and demonstrates: Sinus rhythm with Mobitz 2 AV block.  Left bundle branch block.  Echocardiogram from today personally reviewed.  Septal dyssynchrony consistent with left bundle-branch block.  Normal ejection fraction.   Assessment and Plan:   #Syncope #Left bundle branch block #Mobitz 2 The patient presents with multiple syncopal episodes.  I am concerned that her syncope was arrhythmic in origin given the patient's significant conduction system disease.  She will require permanent pacemaker implant.  I have discussed the pacemaker implant procedure in detail with the patient and her family who is at the bedside.  I discussed the risks, recovery.  She wishes to proceed.  Will keep her n.p.o. after midnight in case we are able to add her onto the schedule tomorrow.  Echo reviewed and shows normal EF, formal read pending.  Plan for dual-chamber permanent pacemaker with left bundle area lead..   Risks, benefits, alternatives to PPM implantation were discussed in detail with the patient today. The patient understands that the risks include but are not limited to bleeding, infection, pneumothorax, perforation, tamponade, vascular damage, renal failure, MI, stroke, death, and lead dislodgement and wishes to proceed.  We will therefore schedule device implantation at the next available time.    Sheria Lang T. Lalla Brothers, MD, [facility], [facility] Cardiac Electrophysiology

## 2023-07-25 NOTE — Telephone Encounter (Signed)
 Patients daughter  LM to inform you that the patient went to the ED 07/24/23, they kept her and they are transferring her to to [facility] in [place] to have a [employer] placed

## 2023-07-25 NOTE — Consult Note (Signed)
 Cardiology Consultation   Patient ID: KEILEIGH VAHEY MRN: [id number]; DOB: 07-23-1929  Admit date: 07/24/2023 Date of Consult: 07/25/2023  PCP:  Sherron Monday, MD   [facility] Providers Cardiologist: [place] remotely  Patient Profile:   Haley Mayer is a 89 y.o. female with a hx of CKD, diabetes, hypertension, LBBB, hyperlipidemia, arthritis who is being seen 07/25/2023 for the evaluation of syncope at the request of Dr Georgeann Oppenheim.  History of Present Illness:   Haley Mayer was seen in 2016 for bradycardia.  Postsurgery she was noted to have bradycardia.  Hospital notes indicated type I second-degree heart block, rare PACs.  She was asymptomatic with all episodes.  In follow-up April 2016 she was noted to have normal sinus rhythm, heart rate 85 bpm, LBBB.  Echo showed normal LV function, mild to moderate TR, MR, moderate LVH.  Stress test with a Myoview showed no ischemia-this was done in outside facility.  Patient presented to the ER 07/24/2023 with syncope.  She had 3 witnessed episodes at church.  She felt like she needed to go to the bathroom but does not recall the events of the day.  She was in Sunday school sitting down when her eyes rolled back and she had tremors and shaking.  Episode lasted for approximately 1 minute then quickly resolved.  Had 2 other episodes that immediately followed.  No head injury or loss of consciousness.  No history of seizure.  No chest pain, nausea, vomiting.  No dizziness or lightheadedness. Eating and drinking normally. Lives with daughter and husband.  She uses a cane and walker as needed. She has history of falls.   In the ER blood pressure 146/56, pulse rate 60 bpm, respiratory rate 18, afebrile, 100% O2.  Labs showed WBC 2.8, sodium 132, potassium 5.4, blood glucose 115, serum creatinine 1.44, BUN 32, GFR 34. HS trop negative x 2. UTI negative.  CT of the head with no intracranial hemorrhage, no CVA.EKG showed normal sinus rhythm, PRI  281mg  with intermittent Mobitz Type 2, PVCs, LBBB. Cardiology was asked to see.   Past Medical History:  Diagnosis Date   Arthritis    Chronic kidney disease    function no 100% per patient    Diabetes mellitus without complication (HCC)    Type II   Glaucoma    Hyperlipidemia    Hypertension     Past Surgical History:  Procedure Laterality Date   ABDOMINAL HYSTERECTOMY     APPENDECTOMY     CATARACT EXTRACTION W/PHACO Right 04/01/2021   Procedure: CATARACT EXTRACTION PHACO AND INTRAOCULAR LENS PLACEMENT (IOC) RIGHT DIABETIC KAHOOK DUAL BLADE GOINIOTOMY 10.28 01.35.6;  Surgeon: Lockie Mola, MD;  Location: [facility];  Service: Ophthalmology;  Laterality: Right;   CATARACT EXTRACTION W/PHACO Left 04/22/2021   Procedure: CATARACT EXTRACTION PHACO AND INTRAOCULAR LENS PLACEMENT (IOC) LEFT DIABETIC OMNI CANALOPLASTY AND TRABECULOTOMY 14.83 02:02.9;  Surgeon: Lockie Mola, MD;  Location: [facility];  Service: Ophthalmology;  Laterality: Left;  leave last case   CHOLECYSTECTOMY     OOPHORECTOMY     PARTIAL KNEE ARTHROPLASTY Left 08/07/2014   PARTIAL KNEE ARTHROPLASTY Left 08/07/2014   Procedure: LEFT MEDIAL PARTIAL KNEE REPLACEMENT ;  Surgeon: Samson Frederic, MD;  Location: [facility];  Service: Orthopedics;  Laterality: Left;   TONSILLECTOMY       Home Medications:  Prior to Admission medications   Medication Sig Start Date End Date Taking? Authorizing Provider  acetaminophen (TYLENOL) 500 MG tablet Take 500  mg by mouth every 6 (six) hours as needed.   Yes [provider]  amLODipine (NORVASC) 10 MG tablet TAKE 1 TABLET BY MOUTH ONCE  DAILY 05/16/23  Yes Sherron Monday, MD  aspirin EC 81 MG tablet Take 1 tablet (81 mg total) by mouth 2 (two) times daily after a meal. Patient taking differently: Take 81 mg by mouth daily. 08/09/14  Yes Swinteck, Arlys John, MD  Calcium Carbonate-Vit D-Min (CALCIUM 1200 PO) Take 1 tablet by mouth every morning.   Yes  [provider]  canagliflozin (INVOKANA) 100 MG TABS tablet Take 1 tablet (100 mg total) by mouth every morning. 07/18/23  Yes Sherron Monday, MD  diphenhydrAMINE (BENADRYL) 25 MG tablet Take 50 mg by mouth every 6 (six) hours as needed for itching or allergies.   Yes [provider]  docusate sodium (COLACE) 100 MG capsule Take 1 capsule (100 mg total) by mouth 2 (two) times daily. 08/09/14  Yes Swinteck, Arlys John, MD  OneTouch Delica Lancets 33G MISC Apply topically. 02/21/20  Yes [provider]  ONETOUCH ULTRA test strip 1 each daily. 02/21/20  Yes [provider]  simvastatin (ZOCOR) 10 MG tablet TAKE 1 TABLET BY MOUTH EVERY  NIGHT AT BEDTIME 06/03/23  Yes Tejan-Sie, Marcelino Freestone, MD  valsartan-hydrochlorothiazide (DIOVAN-HCT) 320-12.5 MG tablet TAKE 1 TABLET BY MOUTH IN THE  MORNING 08/26/22  Yes Boswell, Gareth Morgan, NP  senna (SENOKOT) 8.6 MG TABS tablet Take 2 tablets (17.2 mg total) by mouth at bedtime. Patient not taking: Reported on 07/24/2023 08/09/14   Samson Frederic, MD    Inpatient Medications: Scheduled Meds:  aspirin EC  81 mg Oral Daily   canagliflozin  100 mg Oral q morning   docusate sodium  100 mg Oral BID   heparin  5,000 Units Subcutaneous Q12H   insulin aspart  0-9 Units Subcutaneous TID WC   latanoprost  1 drop Both Eyes QHS   magnesium oxide  200 mg Oral Daily   senna  2 tablet Oral QHS   simvastatin  10 mg Oral QHS   sodium chloride flush  3 mL Intravenous Q12H   Continuous Infusions:  PRN Meds: acetaminophen, atropine, diphenhydrAMINE, hydrALAZINE, traZODone  Allergies:    Allergies  Allergen Reactions   Aspirin Nausea And Vomiting    REGULAR STRENGTH= Also upset stomach    Enalapril Maleate Other (See Comments)    angioedema   Iodinated Contrast Media Other (See Comments)    Vomiting and hives   Iohexol Nausea And Vomiting   Loperamide Hives   Other Other (See Comments)    Tomatoes--acid   Primidone Other (See Comments)     unknown unknown   Sulfa Antibiotics Other (See Comments)    Vomiting and hives   Vasotec [Enalapril]     unknown    Social History:   Social History   Socioeconomic History   Marital status: Widowed    Spouse name: Not on file   Number of children: Not on file   Years of education: Not on file   Highest education level: Not on file  Occupational History   Not on file  Tobacco Use   Smoking status: Never   Smokeless tobacco: Never  Substance and Sexual Activity   Alcohol use: No    Alcohol/week: 0.0 standard drinks of alcohol   Drug use: No   Sexual activity: Not on file  Other Topics Concern   Not on file  Social History Narrative   Not on file  Social Drivers of Corporate investment banker Strain: Not on file  Food Insecurity: Not on file  Transportation Needs: Not on file  Physical Activity: Not on file  Stress: Not on file  Social Connections: Not on file  Intimate Partner Violence: Not on file    Family History:    Family History  Problem Relation Age of Onset   Breast cancer Mother 47     ROS:  Please see the history of present illness.   All other ROS reviewed and negative.     Physical Exam/Data:   Vitals:   07/25/23 0500 07/25/23 0538 07/25/23 0539 07/25/23 0700  BP: (!) 139/44   (!) 154/40  Pulse: (!) 43 60 (!) 50 (!) 44  Resp: 13 13 14 12   Temp:   97.9 F (36.6 C)   TempSrc:   Oral   SpO2: 100% 100% 100% 99%  Weight:      Height:       No intake or output data in the 24 hours ending 07/25/23 0758    07/24/2023    6:26 PM 07/22/2023   11:22 AM 05/26/2023   11:22 AM  Last 3 Weights  Weight (lbs) 164 lb 14.5 oz 165 lb 167 lb 6.4 oz  Weight (kg) 74.8 kg 74.844 kg 75.932 kg     Body mass index is 30.16 kg/m.  General:  Well nourished, well developed, in no acute distress HEENT: normal Neck: no JVD Vascular: No carotid bruits; Distal pulses 2+ bilaterally Cardiac:  normal S1, S2; bradycardia, RR; no murmur  Lungs:  clear to  auscultation bilaterally, no wheezing, rhonchi or rales  Abd: soft, nontender, no hepatomegaly  Ext: no edema Musculoskeletal:  No deformities, BUE and BLE strength normal and equal Skin: warm and dry  Neuro:  CNs 2-12 intact, no focal abnormalities noted Psych:  Normal affect    Telemetry:  Telemetry was personally reviewed and demonstrates:  SB Hr 30-50s, intermittent Mobitz 2, 1st degree AV block  Relevant CV Studies:  Echo 2016 showed normal LVEF  Laboratory Data:  High Sensitivity Troponin:   Recent Labs  Lab 07/24/23 1128 07/24/23 1443  TROPONINIHS 14 15     Chemistry Recent Labs  Lab 07/24/23 1128  NA 132*  K 5.4*  CL 100  CO2 24  GLUCOSE 115*  BUN 32*  CREATININE 1.44*  CALCIUM 9.5  MG 2.3  GFRNONAA 34*  ANIONGAP 8    No results for input(s): "PROT", "ALBUMIN", "AST", "ALT", "ALKPHOS", "BILITOT" in the last 168 hours. Lipids No results for input(s): "CHOL", "TRIG", "HDL", "LABVLDL", "LDLCALC", "CHOLHDL" in the last 168 hours.  Hematology Recent Labs  Lab 07/24/23 1128  WBC 2.8*  RBC 4.15  HGB 13.4  HCT 39.9  MCV 96.1  MCH 32.3  MCHC 33.6  RDW 13.4  PLT 152   Thyroid  Recent Labs  Lab 07/24/23 1400  TSH 3.826    BNPNo results for input(s): "BNP", "PROBNP" in the last 168 hours.  DDimer No results for input(s): "DDIMER" in the last 168 hours.   Radiology/Studies:  X-ray chest PA and lateral Result Date: 07/24/2023 CLINICAL DATA:  Syncope. EXAM: CHEST - 2 VIEW COMPARISON:  September 05, 2007. FINDINGS: The heart size and mediastinal contours are within normal limits. Hypoinflation of the lungs is noted. Both lungs are clear. The visualized skeletal structures are unremarkable. IMPRESSION: No active cardiopulmonary disease. Electronically Signed   By: Lupita Raider M.D.   On: 07/24/2023 16:52  CT Head Wo Contrast Result Date: 07/24/2023 CLINICAL DATA:  Syncope. EXAM: CT HEAD WITHOUT CONTRAST TECHNIQUE: Contiguous axial images were obtained from  the base of the skull through the vertex without intravenous contrast. RADIATION DOSE REDUCTION: This exam was performed according to the departmental dose-optimization program which includes automated exposure control, adjustment of the mA and/or kV according to patient size and/or use of iterative reconstruction technique. COMPARISON:  11/18/2021 FINDINGS: Brain: No evidence of intracranial hemorrhage, acute infarction, hydrocephalus, extra-axial collection, or mass lesion/mass effect. Mild chronic small vessel disease, without significant change. Vascular:  No hyperdense vessel or other acute findings. Skull: No evidence of fracture or other significant bone abnormality. Sinuses/Orbits:  No acute findings. Other: None. IMPRESSION: No acute intracranial abnormality. Mild chronic small vessel disease. Electronically Signed   By: Danae Orleans M.D.   On: 07/24/2023 15:19     Assessment and Plan:   syncope High grade heart block - presented with syncope with no prodrome. Patient has no memory of this. She was sitting when she had syncope x 3 witnessed by people at church - EKG shows NSR/SB with Mobitz Type 2, LBBB - tele shows SB HR 40-50s, Mobitz type 2 - asymptomatic at this time - Keep Mag>2, K>4 - TSH wnl - not on rate lower medication at baseline - check echo - Patient seems to have good mental status, but poor baseline function. She will likely require PPM with transfer to [facility]. Can see EP who can further discuss with patient and family  HTN - BP meds held (amlodipine and valsartan-hydrochlorothiazide)  DM2 - per IM  CKD stage 3 - Scr stable, Scr 1.24  For questions or updates, please contact [facility] Please consult [email or web address] for contact info under    Signed, Jaymee Tilson David Stall, PA-C  07/25/2023 7:58 AM

## 2023-07-25 NOTE — Evaluation (Signed)
 Physical Therapy Evaluation Patient Details Name: Haley Mayer MRN: [id number] DOB: [birth year] Today's Date: 07/25/2023  History of Present Illness  Pt is a 89 y.o. female with medical history significant of type I second-degree AV block 2016, HTN, HLD, CKD stage IIIb, IIDM, Parkinson's disease presented with syncope.  Clinical Impression  Pt A&Ox4, reported at baseline she is modI in home with her rollator, RW for outside home. Lives with family who performs IADLs, pt modI for ADLs. She was able to transfer to EOB and sit <> stand with rollator modI. Able to also don shoes without assistance. She ambulated ~95ft total with her rollator, supervision for lines/leads. Able to use commode and wash hands without any assistance. Returned to bed with needs in reach. The patient demonstrated and reported return to baseline level of functioning, no further acute PT needs indicated. PT to sign off. Please reconsult PT if pt status changes or acute needs are identified.          If plan is discharge home, recommend the following: Assist for transportation;Assistance with cooking/housework   Can travel by private vehicle        Equipment Recommendations None recommended by PT  Recommendations for Other Services       Functional Status Assessment       Precautions / Restrictions Precautions Precautions: Fall Recall of Precautions/Restrictions: Intact Restrictions Weight Bearing Restrictions Per Provider Order: No      Mobility  Bed Mobility Overal bed mobility: Modified Independent                  Transfers Overall transfer level: Modified independent Equipment used: Rollator (4 wheels)                    Ambulation/Gait Ambulation/Gait assistance: Modified independent (Device/Increase time) Gait Distance (Feet): 65 Feet Assistive device: Rollator (4 wheels)         General Gait Details: no LOB, no physical assistance needed  Stairs             Wheelchair Mobility     Tilt Bed    Modified Rankin (Stroke Patients Only)       Balance Overall balance assessment: Needs assistance Sitting-balance support: Feet supported Sitting balance-Leahy Scale: Good       Standing balance-Leahy Scale: Good                               Pertinent Vitals/Pain Pain Assessment Pain Assessment: No/denies pain    Home Living Family/patient expects to be discharged to:: Private residence Living Arrangements: Children (daughter and SIL) Available Help at Discharge: Family;Available 24 hours/day Type of Home: House Home Access: Stairs to enter Entrance Stairs-Rails: Left Entrance Stairs-Number of Steps: 3   Home Layout: One level Home Equipment: Agricultural consultant (2 wheels);Rollator (4 wheels) Additional Comments: 2 falls in the last 6 months    Prior Function Prior Level of Function : Independent/Modified Independent             Mobility Comments: modI with rollator in home RW in community ADLs Comments: modI for ADLs, family performs IADLs     Extremity/Trunk Assessment   Upper Extremity Assessment Upper Extremity Assessment: Overall WFL for tasks assessed            Communication        Cognition Arousal: Alert Behavior During Therapy: WFL for tasks assessed/performed   PT - Cognitive impairments: No apparent impairments  Cueing       General Comments      Exercises     Assessment/Plan    PT Assessment Patient does not need any further PT services  PT Problem List         PT Treatment Interventions      PT Goals (Current goals can be found in the Care Plan section)       Frequency       Co-evaluation               AM-PAC PT "6 Clicks" Mobility  Outcome Measure Help needed turning from your back to your side while in a flat bed without using bedrails?: None Help needed moving from lying on your back to sitting on the side  of a flat bed without using bedrails?: None Help needed moving to and from a bed to a chair (including a wheelchair)?: None Help needed standing up from a chair using your arms (e.g., wheelchair or bedside chair)?: None Help needed to walk in hospital room?: None Help needed climbing 3-5 steps with a railing? : None 6 Click Score: 24    End of Session   Activity Tolerance: Patient tolerated treatment well Patient left: in bed;with call bell/phone within reach Nurse Communication: Mobility status PT Visit Diagnosis: Other abnormalities of gait and mobility (R26.89)    Time: 1030-1057 PT Time Calculation (min) (ACUTE ONLY): 27 min   Charges:   PT Evaluation $PT Eval Low Complexity: 1 Low PT Treatments $Therapeutic Activity: 8-22 mins PT General Charges $$ ACUTE PT VISIT: 1 Visit        Olga Coaster PT, DPT 1:13 PM,07/25/23

## 2023-07-25 NOTE — H&P (Signed)
 History and Physical    Patient: Haley Mayer ZOX:[id number] DOB: 12-May-1930 DOA: 07/25/2023 DOS: the patient was seen and examined on 07/25/2023 PCP: Sherron Monday, MD  Patient coming from: [facility].   Chief Complaint: No chief complaint on file.  HPI: Haley Mayer is a 89 y.o. female with medical history significant of type I second-degree AV block 2016, hypertension, hyperlipidemia, CKD stage IIIb, diabetes type 2, Parkinson disease presents after syncope episode.  Per HPI patient was at church and sitting and a bystander at her side found that she suddenly was  staring forward and unresponsive for about 3 to 5 seconds.  She recovered  consciousness but then soon developed another 2 similar episodes.  No loss of control of urine or bowel movement.  Patient denied any prodromal of lightheadedness, blurry vision or palpitation.  Evaluation at [facility] she was noted to have Mobitz type II second-degree AV block and cardiogenic syncope.  She was evaluated by cardiology at [facility] who recommended transfer to [facility] for EP evaluation and possible pacemaker placement.  Labs from 3/10: Sodium 135, BUN 28, creatinine 1.2, white blood cell 2.9, platelets 144 Cortisol level 10.2.  CT head: CT head no acute intracranial abnormality. Chest  x-ray: No acute cardiopulmonary disease.    Review of Systems: As mentioned in the history of present illness. All other systems reviewed and are negative. Past Medical History:  Diagnosis Date   Arthritis    Chronic kidney disease    function no 100% per patient    Diabetes mellitus without complication (HCC)    Type II   Glaucoma    Hyperlipidemia    Hypertension    Past Surgical History:  Procedure Laterality Date   ABDOMINAL HYSTERECTOMY     APPENDECTOMY     CATARACT EXTRACTION W/PHACO Right 04/01/2021   Procedure: CATARACT EXTRACTION PHACO AND INTRAOCULAR LENS PLACEMENT (IOC) RIGHT DIABETIC KAHOOK DUAL BLADE GOINIOTOMY 10.28 01.35.6;   Surgeon: Lockie Mola, MD;  Location: [facility];  Service: Ophthalmology;  Laterality: Right;   CATARACT EXTRACTION W/PHACO Left 04/22/2021   Procedure: CATARACT EXTRACTION PHACO AND INTRAOCULAR LENS PLACEMENT (IOC) LEFT DIABETIC OMNI CANALOPLASTY AND TRABECULOTOMY 14.83 02:02.9;  Surgeon: Lockie Mola, MD;  Location: [facility];  Service: Ophthalmology;  Laterality: Left;  leave last case   CHOLECYSTECTOMY     OOPHORECTOMY     PARTIAL KNEE ARTHROPLASTY Left 08/07/2014   PARTIAL KNEE ARTHROPLASTY Left 08/07/2014   Procedure: LEFT MEDIAL PARTIAL KNEE REPLACEMENT ;  Surgeon: Samson Frederic, MD;  Location: [facility];  Service: Orthopedics;  Laterality: Left;   TONSILLECTOMY     Social History:  reports that she has never smoked. She has never used smokeless tobacco. She reports that she does not drink alcohol and does not use drugs.  Allergies  Allergen Reactions   Aspirin Nausea And Vomiting    REGULAR STRENGTH= Also upset stomach    Enalapril Maleate Other (See Comments)    angioedema   Iodinated Contrast Media Other (See Comments)    Vomiting and hives   Iohexol Nausea And Vomiting   Loperamide Hives   Other Other (See Comments)    Tomatoes--acid   Primidone Other (See Comments)    unknown unknown   Sulfa Antibiotics Other (See Comments)    Vomiting and hives   Vasotec [Enalapril]     unknown    Family History  Problem Relation Age of Onset   Breast cancer Mother 11    Prior to Admission  medications   Medication Sig Start Date End Date Taking? Authorizing Provider  acetaminophen (TYLENOL) 500 MG tablet Take 500 mg by mouth every 6 (six) hours as needed.    [provider]  amLODipine (NORVASC) 10 MG tablet TAKE 1 TABLET BY MOUTH ONCE  DAILY 05/16/23   Sherron Monday, MD  aspirin EC 81 MG tablet Take 1 tablet (81 mg total) by mouth 2 (two) times daily after a meal. Patient taking differently: Take 81 mg by mouth daily. 08/09/14    Swinteck, Arlys John, MD  Calcium Carbonate-Vit D-Min (CALCIUM 1200 PO) Take 1 tablet by mouth every morning.    [provider]  canagliflozin (INVOKANA) 100 MG TABS tablet Take 1 tablet (100 mg total) by mouth every morning. 07/18/23   Sherron Monday, MD  diphenhydrAMINE (BENADRYL) 25 MG tablet Take 50 mg by mouth every 6 (six) hours as needed for itching or allergies.    [provider]  docusate sodium (COLACE) 100 MG capsule Take 1 capsule (100 mg total) by mouth 2 (two) times daily. 08/09/14   Swinteck, Arlys John, MD  OneTouch Delica Lancets 33G MISC Apply topically. 02/21/20   [provider]  [facility] ULTRA test strip 1 each daily. 02/21/20   [provider]  simvastatin (ZOCOR) 10 MG tablet TAKE 1 TABLET BY MOUTH EVERY  NIGHT AT BEDTIME 06/03/23   Sherron Monday, MD  valsartan-hydrochlorothiazide (DIOVAN-HCT) 320-12.5 MG tablet TAKE 1 TABLET BY MOUTH IN THE  MORNING 08/26/22   Orson Eva, NP    Physical Exam: Vitals:   07/25/23 1710  BP: (!) 167/60  Pulse: 69  Resp: 16  Temp: 97.7 F (36.5 C)  TempSrc: Oral  SpO2: 100%   General; Alert, NAD CVS: S 1, S 2 RRR Lungs: Normal Respiratory Effort, CTA Abdomen: BS present, soft, nt, nd Extremities: no edema Neuro: Alert, conversant, follows command,.   Data Reviewed:  Labs reviewed.   Assessment and Plan: No notes have been filed under this hospital service. Service: Hospitalist  1-Cardiogenic syncope, Mobitz type II second-degree AV block, high-grade AV block: -Presented with multiple episode of syncope.  Found to have high degree AV block. -Was transferred from [facility].  Cardiology recommendation for pacemaker placement and evaluation by EP -TSH; 3.8, Mg 2.3, phosphorus 3.8 -EP contacted, they will see patient tonight.   2-Hypertension: Hold Norvasc, valsartan for now.  Avoid Hypotension.  PRN Hydralazine for SBP more than 160 Also hold valsartan due to mild hyperkalemia on admission.    3-Diabetes type 2: Hold Invokana.  SSI  4-CKD stage IIIb: -CR baseline 1.4--1.5 -Renal function stable.    5-Parkinson's disease: Does not appears to be on medication  Await final med rec   Leukopenia; check B 12 Mild hyperkalemia; resolved.   HLD; started statins.    Advance Care Planning:   Code Status: Full Code Full Code  Consults: Cardiology Informed of patient arrival to [facility]  Family Communication: Daughter at bedside updated.   Severity of Illness: The appropriate patient status for this patient is INPATIENT. Inpatient status is judged to be reasonable and necessary in order to provide the required intensity of service to ensure the patient's safety. The patient's presenting symptoms, physical exam findings, and initial radiographic and laboratory data in the context of their chronic comorbidities is felt to place them at high risk for further clinical deterioration. Furthermore, it is not anticipated that the patient will be medically stable for discharge from the hospital within 2 midnights of admission.   *  I certify that at the point of admission it is my clinical judgment that the patient will require inpatient hospital care spanning beyond 2 midnights from the point of admission due to high intensity of service, high risk for further deterioration and high frequency of surveillance required.*  Author: Alba Cory, MD 07/25/2023 5:42 PM  For on call review [email or web address].

## 2023-07-25 NOTE — Plan of Care (Signed)

## 2023-07-25 NOTE — Discharge Summary (Addendum)
 Physician Discharge Summary  Haley Mayer ZOX:[id number] DOB: 07-03-1929 DOA: 07/24/2023  PCP: Sherron Monday, MD  Admit date: 07/24/2023 Discharge date: 07/25/2023  Admitted From: Home Disposition:  Transfer to [facility]  Recommendations for Outpatient Follow-up:  NA   Home Health:NA  Equipment/Devices:None   Discharge Condition:Stable  CODE STATUS:FULL  Diet recommendation: NPO pending EP evaluation  Brief/Interim Summary:  89 y.o. female with medical history significant of type I second-degree AV block 2016, HTN, HLD, CKD stage IIIb, IIDM, Parkinson's disease presented with syncope   Patient was at church and sitting, and bystander at her side found that she suddenly falls and staring forward and unresponsive for about 3 to 5 seconds, no fall.  She recovered consciousness herself but then soon she developed another 2 similar episode.  No loss control urine or bowel movement.  Patient denied any prodromes of lightheadedness blurry vision palpitations.    Discharge Diagnoses:  Principal Problem:   Syncope Active Problems:   Second degree AV block   Syncope, cardiogenic  Cardiogenic syncope Mobitz type II second-degree AV block Patient with high degree AV block.  Evaluated by cardiology at [facility].  Unable to place permanent pacemaker at our facility in urgent manner.  Discussed with EP at [facility].  Request that [facility] admit patient and consult EP.  Please notify EP on arrival to [facility]  Discharge Instructions  Discharge Instructions     Diet - low sodium heart healthy   Complete by: As directed    Increase activity slowly   Complete by: As directed       Allergies as of 07/25/2023       Reactions   Aspirin Nausea And Vomiting   REGULAR STRENGTH= Also upset stomach    Enalapril Maleate Other (See Comments)   angioedema   Iodinated Contrast Media Other (See Comments)   Vomiting and hives   Iohexol Nausea And Vomiting   Loperamide Hives   Other Other (See  Comments)   Tomatoes--acid   Primidone Other (See Comments)   unknown unknown   Sulfa Antibiotics Other (See Comments)   Vomiting and hives   Vasotec [enalapril]    unknown        Medication List     STOP taking these medications    senna 8.6 MG Tabs tablet Commonly known as: SENOKOT       TAKE these medications    acetaminophen 500 MG tablet Commonly known as: TYLENOL Take 500 mg by mouth every 6 (six) hours as needed.   amLODipine 10 MG tablet Commonly known as: NORVASC TAKE 1 TABLET BY MOUTH ONCE  DAILY   aspirin EC 81 MG tablet Take 1 tablet (81 mg total) by mouth 2 (two) times daily after a meal. What changed: when to take this   CALCIUM 1200 PO Take 1 tablet by mouth every morning.   canagliflozin 100 MG Tabs tablet Commonly known as: Invokana Take 1 tablet (100 mg total) by mouth every morning.   diphenhydrAMINE 25 MG tablet Commonly known as: BENADRYL Take 50 mg by mouth every 6 (six) hours as needed for itching or allergies.   docusate sodium 100 MG capsule Commonly known as: COLACE Take 1 capsule (100 mg total) by mouth 2 (two) times daily.   OneTouch Delica Lancets 33G Misc Apply topically.   OneTouch Ultra test strip Generic drug: glucose blood 1 each daily.   simvastatin 10 MG tablet Commonly known as: ZOCOR TAKE 1 TABLET BY MOUTH EVERY  NIGHT AT  BEDTIME   valsartan-hydrochlorothiazide 320-12.5 MG tablet Commonly known as: DIOVAN-HCT TAKE 1 TABLET BY MOUTH IN THE  MORNING        Allergies  Allergen Reactions   Aspirin Nausea And Vomiting    REGULAR STRENGTH= Also upset stomach    Enalapril Maleate Other (See Comments)    angioedema   Iodinated Contrast Media Other (See Comments)    Vomiting and hives   Iohexol Nausea And Vomiting   Loperamide Hives   Other Other (See Comments)    Tomatoes--acid   Primidone Other (See Comments)    unknown unknown   Sulfa Antibiotics Other (See Comments)    Vomiting and hives    Vasotec [Enalapril]     unknown    Consultations: Cardiology   Procedures/Studies: X-ray chest PA and lateral Result Date: 07/24/2023 CLINICAL DATA:  Syncope. EXAM: CHEST - 2 VIEW COMPARISON:  September 05, 2007. FINDINGS: The heart size and mediastinal contours are within normal limits. Hypoinflation of the lungs is noted. Both lungs are clear. The visualized skeletal structures are unremarkable. IMPRESSION: No active cardiopulmonary disease. Electronically Signed   By: Lupita Raider M.D.   On: 07/24/2023 16:52   CT Head Wo Contrast Result Date: 07/24/2023 CLINICAL DATA:  Syncope. EXAM: CT HEAD WITHOUT CONTRAST TECHNIQUE: Contiguous axial images were obtained from the base of the skull through the vertex without intravenous contrast. RADIATION DOSE REDUCTION: This exam was performed according to the departmental dose-optimization program which includes automated exposure control, adjustment of the mA and/or kV according to patient size and/or use of iterative reconstruction technique. COMPARISON:  11/18/2021 FINDINGS: Brain: No evidence of intracranial hemorrhage, acute infarction, hydrocephalus, extra-axial collection, or mass lesion/mass effect. Mild chronic small vessel disease, without significant change. Vascular:  No hyperdense vessel or other acute findings. Skull: No evidence of fracture or other significant bone abnormality. Sinuses/Orbits:  No acute findings. Other: None. IMPRESSION: No acute intracranial abnormality. Mild chronic small vessel disease. Electronically Signed   By: Danae Orleans M.D.   On: 07/24/2023 15:19      Subjective: Seen and examined on the day of discharge.  Stable no distress.  Appropriate for transfer to [facility].  Discharge Exam: Vitals:   07/25/23 0935 07/25/23 1000  BP:  (!) 138/57  Pulse:  (!) 59  Resp:  (!) 22  Temp: 97.7 F (36.5 C)   SpO2:  100%   Vitals:   07/25/23 0900 07/25/23 0930 07/25/23 0935 07/25/23 1000  BP: (!) 133/54 (!)  132/55  (!) 138/57  Pulse: (!) 57 63  (!) 59  Resp: 17 16  (!) 22  Temp:   97.7 F (36.5 C)   TempSrc:   Oral   SpO2: 100% 100%  100%  Weight:      Height:        General: Pt is alert, awake, not in acute distress Cardiovascular: Bradycardic, regular rhythm, no murmurs Respiratory: CTA bilaterally, no wheezing, no rhonchi Abdominal: Soft, NT, ND, bowel sounds + Extremities: no edema, no cyanosis    The results of significant diagnostics from this hospitalization (including imaging, microbiology, ancillary and laboratory) are listed below for reference.     Microbiology: Recent Results (from the past 240 hours)  Resp panel by RT-PCR (RSV, Flu A&B, Covid) Anterior Nasal Swab     Status: None   Collection Time: 07/24/23  1:43 PM   Specimen: Anterior Nasal Swab  Result Value Ref Range Status   SARS Coronavirus 2 by RT PCR NEGATIVE  NEGATIVE Final    Comment: (NOTE) SARS-CoV-2 target nucleic acids are NOT DETECTED.  The SARS-CoV-2 RNA is generally detectable in upper respiratory specimens during the acute phase of infection. The lowest concentration of SARS-CoV-2 viral copies this assay can detect is 138 copies/mL. A negative result does not preclude SARS-Cov-2 infection and should not be used as the sole basis for treatment or other patient management decisions. A negative result may occur with  improper specimen collection/handling, submission of specimen other than nasopharyngeal swab, presence of viral mutation(s) within the areas targeted by this assay, and inadequate number of viral copies(<138 copies/mL). A negative result must be combined with clinical observations, patient history, and epidemiological information. The expected result is Negative.  Fact Sheet for Patients:  BloggerCourse.com  Fact Sheet for Healthcare Providers:  SeriousBroker.it  This test is no t yet approved or cleared by the [place] FDA  and  has been authorized for detection and/or diagnosis of SARS-CoV-2 by FDA under an Emergency Use Authorization (EUA). This EUA will remain  in effect (meaning this test can be used) for the duration of the COVID-19 declaration under Section 564(b)(1) of the Act, 21 U.S.C.section 360bbb-3(b)(1), unless the authorization is terminated  or revoked sooner.       Influenza A by PCR NEGATIVE NEGATIVE Final   Influenza B by PCR NEGATIVE NEGATIVE Final    Comment: (NOTE) The Xpert Xpress SARS-CoV-2/FLU/RSV plus assay is intended as an aid in the diagnosis of influenza from Nasopharyngeal swab specimens and should not be used as a sole basis for treatment. Nasal washings and aspirates are unacceptable for Xpert Xpress SARS-CoV-2/FLU/RSV testing.  Fact Sheet for Patients: BloggerCourse.com  Fact Sheet for Healthcare Providers: SeriousBroker.it  This test is not yet approved or cleared by the [place] FDA and has been authorized for detection and/or diagnosis of SARS-CoV-2 by FDA under an Emergency Use Authorization (EUA). This EUA will remain in effect (meaning this test can be used) for the duration of the COVID-19 declaration under Section 564(b)(1) of the Act, 21 U.S.C. section 360bbb-3(b)(1), unless the authorization is terminated or revoked.     Resp Syncytial Virus by PCR NEGATIVE NEGATIVE Final    Comment: (NOTE) Fact Sheet for Patients: BloggerCourse.com  Fact Sheet for Healthcare Providers: SeriousBroker.it  This test is not yet approved or cleared by the [place] FDA and has been authorized for detection and/or diagnosis of SARS-CoV-2 by FDA under an Emergency Use Authorization (EUA). This EUA will remain in effect (meaning this test can be used) for the duration of the COVID-19 declaration under Section 564(b)(1) of the Act, 21 U.S.C. section 360bbb-3(b)(1),  unless the authorization is terminated or revoked.  Performed at [facility], [address] [place], [place] [address]      Labs: BNP (last 3 results) No results for input(s): "BNP" in the last 8760 hours. Basic Metabolic Panel: Recent Labs  Lab 07/24/23 1128 07/24/23 1400 07/25/23 0822  NA 132*  --  135  K 5.4*  --  4.0  CL 100  --  102  CO2 24  --  25  GLUCOSE 115*  --  92  BUN 32*  --  28*  CREATININE 1.44*  --  1.24*  CALCIUM 9.5  --  8.9  MG 2.3  --   --   PHOS  --  3.8  --    Liver Function Tests: No results for input(s): "AST", "ALT", "ALKPHOS", "BILITOT", "PROT", "ALBUMIN" in the last 168 hours. No  results for input(s): "LIPASE", "AMYLASE" in the last 168 hours. No results for input(s): "AMMONIA" in the last 168 hours. CBC: Recent Labs  Lab 07/24/23 1128 07/25/23 0822  WBC 2.8* 2.9*  NEUTROABS  --  1.3*  HGB 13.4 13.2  HCT 39.9 38.8  MCV 96.1 95.1  PLT 152 144*   Cardiac Enzymes: No results for input(s): "CKTOTAL", "CKMB", "CKMBINDEX", "TROPONINI" in the last 168 hours. BNP: Invalid input(s): "POCBNP" CBG: Recent Labs  Lab 07/24/23 1646 07/24/23 2207 07/25/23 0445  GLUCAP 110* 106* 74   D-Dimer No results for input(s): "DDIMER" in the last 72 hours. Hgb A1c No results for input(s): "HGBA1C" in the last 72 hours. Lipid Profile No results for input(s): "CHOL", "HDL", "LDLCALC", "TRIG", "CHOLHDL", "LDLDIRECT" in the last 72 hours. Thyroid function studies Recent Labs    07/24/23 1400  TSH 3.826   Anemia work up No results for input(s): "VITAMINB12", "FOLATE", "FERRITIN", "TIBC", "IRON", "RETICCTPCT" in the last 72 hours. Urinalysis    Component Value Date/Time   COLORURINE YELLOW (A) 07/24/2023 1343   APPEARANCEUR CLEAR (A) 07/24/2023 1343   LABSPEC 1.010 07/24/2023 1343   PHURINE 6.0 07/24/2023 1343   GLUCOSEU >=500 (A) 07/24/2023 1343   HGBUR NEGATIVE 07/24/2023 1343   BILIRUBINUR NEGATIVE 07/24/2023 1343   KETONESUR  NEGATIVE 07/24/2023 1343   PROTEINUR NEGATIVE 07/24/2023 1343   UROBILINOGEN 0.2 07/09/2014 1051   NITRITE NEGATIVE 07/24/2023 1343   LEUKOCYTESUR NEGATIVE 07/24/2023 1343   Sepsis Labs Recent Labs  Lab 07/24/23 1128 07/25/23 0822  WBC 2.8* 2.9*   Microbiology Recent Results (from the past 240 hours)  Resp panel by RT-PCR (RSV, Flu A&B, Covid) Anterior Nasal Swab     Status: None   Collection Time: 07/24/23  1:43 PM   Specimen: Anterior Nasal Swab  Result Value Ref Range Status   SARS Coronavirus 2 by RT PCR NEGATIVE NEGATIVE Final    Comment: (NOTE) SARS-CoV-2 target nucleic acids are NOT DETECTED.  The SARS-CoV-2 RNA is generally detectable in upper respiratory specimens during the acute phase of infection. The lowest concentration of SARS-CoV-2 viral copies this assay can detect is 138 copies/mL. A negative result does not preclude SARS-Cov-2 infection and should not be used as the sole basis for treatment or other patient management decisions. A negative result may occur with  improper specimen collection/handling, submission of specimen other than nasopharyngeal swab, presence of viral mutation(s) within the areas targeted by this assay, and inadequate number of viral copies(<138 copies/mL). A negative result must be combined with clinical observations, patient history, and epidemiological information. The expected result is Negative.  Fact Sheet for Patients:  BloggerCourse.com  Fact Sheet for Healthcare Providers:  SeriousBroker.it  This test is no t yet approved or cleared by the [place] FDA and  has been authorized for detection and/or diagnosis of SARS-CoV-2 by FDA under an Emergency Use Authorization (EUA). This EUA will remain  in effect (meaning this test can be used) for the duration of the COVID-19 declaration under Section 564(b)(1) of the Act, 21 U.S.C.section 360bbb-3(b)(1), unless the  authorization is terminated  or revoked sooner.       Influenza A by PCR NEGATIVE NEGATIVE Final   Influenza B by PCR NEGATIVE NEGATIVE Final    Comment: (NOTE) The Xpert Xpress SARS-CoV-2/FLU/RSV plus assay is intended as an aid in the diagnosis of influenza from Nasopharyngeal swab specimens and should not be used as a sole basis for treatment. Nasal washings and aspirates are unacceptable for Xpert  Xpress SARS-CoV-2/FLU/RSV testing.  Fact Sheet for Patients: BloggerCourse.com  Fact Sheet for Healthcare Providers: SeriousBroker.it  This test is not yet approved or cleared by the [place] FDA and has been authorized for detection and/or diagnosis of SARS-CoV-2 by FDA under an Emergency Use Authorization (EUA). This EUA will remain in effect (meaning this test can be used) for the duration of the COVID-19 declaration under Section 564(b)(1) of the Act, 21 U.S.C. section 360bbb-3(b)(1), unless the authorization is terminated or revoked.     Resp Syncytial Virus by PCR NEGATIVE NEGATIVE Final    Comment: (NOTE) Fact Sheet for Patients: BloggerCourse.com  Fact Sheet for Healthcare Providers: SeriousBroker.it  This test is not yet approved or cleared by the [place] FDA and has been authorized for detection and/or diagnosis of SARS-CoV-2 by FDA under an Emergency Use Authorization (EUA). This EUA will remain in effect (meaning this test can be used) for the duration of the COVID-19 declaration under Section 564(b)(1) of the Act, 21 U.S.C. section 360bbb-3(b)(1), unless the authorization is terminated or revoked.  Performed at [facility], [address] [place], [place] [address]      Time coordinating discharge: Over 30 minutes  SIGNED:   Tresa Moore, MD  [facility] 07/25/2023, 11:03 AM Pager   If 7PM-7AM, please contact  night-coverage

## 2023-07-25 NOTE — ED Notes (Signed)
 Pt departed facility with [facility] en route to [facility]. Pt family at bedside. Pt belongings given to pt daughter.

## 2023-07-25 NOTE — ED Notes (Signed)
 Cardiology at bedside.

## 2023-07-25 NOTE — ED Notes (Signed)
 Patient called out inquiring about a meal. Patient was informed that she was currently NPO and therefore unable to have anything to eat or drink.

## 2023-07-26 ENCOUNTER — Other Ambulatory Visit: Payer: Self-pay

## 2023-07-26 ENCOUNTER — Encounter (HOSPITAL_COMMUNITY)
Admission: EM | Disposition: A | Discharge status: Home / Self Care | Payer: Self-pay | Source: Other Acute Inpatient Hospital | Attending: Internal Medicine

## 2023-07-26 DIAGNOSIS — I441 Atrioventricular block, second degree: Principal | ICD-10-CM

## 2023-07-26 HISTORY — PX: PACEMAKER IMPLANT: EP1218

## 2023-07-26 LAB — CBC
HCT: 36.2 % (ref 36.0–46.0)
Hemoglobin: 12.3 g/dL (ref 12.0–15.0)
MCH: 31.9 pg (ref 26.0–34.0)
MCHC: 34 g/dL (ref 30.0–36.0)
MCV: 93.8 fL (ref 80.0–100.0)
Platelets: 148 10*3/uL — ABNORMAL LOW (ref 150–400)
RBC: 3.86 MIL/uL — ABNORMAL LOW (ref 3.87–5.11)
RDW: 13.2 % (ref 11.5–15.5)
WBC: 3.3 10*3/uL — ABNORMAL LOW (ref 4.0–10.5)
nRBC: 0 % (ref 0.0–0.2)

## 2023-07-26 LAB — GLUCOSE, CAPILLARY
Glucose-Capillary: 76 mg/dL (ref 70–99)
Glucose-Capillary: 94 mg/dL (ref 70–99)
Glucose-Capillary: 63 mg/dL — ABNORMAL LOW (ref 70–99)
Glucose-Capillary: 159 mg/dL — ABNORMAL HIGH (ref 70–99)
Glucose-Capillary: 122 mg/dL — ABNORMAL HIGH (ref 70–99)

## 2023-07-26 LAB — SURGICAL PCR SCREEN
MRSA, PCR: NEGATIVE
Staphylococcus aureus: POSITIVE — AB

## 2023-07-26 LAB — COMPREHENSIVE METABOLIC PANEL
ALT: 14 U/L (ref 0–44)
AST: 17 U/L (ref 15–41)
Albumin: 2.8 g/dL — ABNORMAL LOW (ref 3.5–5.0)
Alkaline Phosphatase: 48 U/L (ref 38–126)
Anion gap: 8 (ref 5–15)
BUN: 27 mg/dL — ABNORMAL HIGH (ref 8–23)
CO2: 23 mmol/L (ref 22–32)
Calcium: 8.8 mg/dL — ABNORMAL LOW (ref 8.9–10.3)
Chloride: 101 mmol/L (ref 98–111)
Creatinine, Ser: 1.56 mg/dL — ABNORMAL HIGH (ref 0.44–1.00)
GFR, Estimated: 31 mL/min — ABNORMAL LOW (ref >60)
Glucose, Bld: 98 mg/dL (ref 70–99)
Potassium: 4 mmol/L (ref 3.5–5.1)
Sodium: 132 mmol/L — ABNORMAL LOW (ref 135–145)
Total Bilirubin: 0.7 mg/dL (ref 0.0–1.2)
Total Protein: 6.7 g/dL (ref 6.5–8.1)

## 2023-07-26 LAB — ECHOCARDIOGRAM COMPLETE
AR max vel: 2.15 cm2
AV Area VTI: 2.51 cm2
AV Area mean vel: 2.33 cm2
AV Mean grad: 3 mmHg
AV Peak grad: 6.3 mmHg
Ao pk vel: 1.25 m/s
Area-P 1/2: 4.99 cm2
Height: 62 in
MV VTI: 2.94 cm2
S' Lateral: 2.5 cm
Weight: 2638.47 [oz_av]

## 2023-07-26 LAB — FOLATE: Folate: >40 ng/mL (ref >5.9)

## 2023-07-26 LAB — PROTIME-INR
INR: 1 (ref 0.8–1.2)
Prothrombin Time: 13.6 s (ref 11.4–15.2)

## 2023-07-26 SURGERY — PACEMAKER IMPLANT

## 2023-07-26 MED ORDER — SODIUM CHLORIDE 0.9 % IV SOLN: INTRAVENOUS | Status: DC

## 2023-07-26 MED ORDER — SODIUM CHLORIDE 0.9% FLUSH
3.0000 mL | Freq: Two times a day (BID) | INTRAVENOUS | Status: DC
  Administered 2023-07-26 – 2023-07-27 (×3): 3 mL via INTRAVENOUS

## 2023-07-26 MED ORDER — GENTAMICIN SULFATE 40 MG/ML IJ SOLN
80.0000 mg | INTRAMUSCULAR | Status: AC
  Administered 2023-07-26: 80 mg
  Filled 2023-07-26: qty 2

## 2023-07-26 MED ORDER — ONDANSETRON HCL 4 MG/2ML IJ SOLN: 4.0000 mg | Freq: Four times a day (QID) | INTRAMUSCULAR | Status: DC | PRN

## 2023-07-26 MED ORDER — SODIUM CHLORIDE 0.9% FLUSH: 3.0000 mL | INTRAVENOUS | Status: DC | PRN

## 2023-07-26 MED ORDER — CEFAZOLIN SODIUM-DEXTROSE 2-4 GM/100ML-% IV SOLN
2.0000 g | INTRAVENOUS | Status: AC
  Administered 2023-07-26: 2 g via INTRAVENOUS

## 2023-07-26 MED ORDER — LIDOCAINE HCL (PF) 1 % IJ SOLN
INTRAMUSCULAR | Status: DC | PRN
  Administered 2023-07-26: 60 mL

## 2023-07-26 MED ORDER — SODIUM CHLORIDE 0.9 % IV SOLN: 250.0000 mL | INTRAVENOUS | Status: DC

## 2023-07-26 MED ORDER — CHLORHEXIDINE GLUCONATE 4 % EX SOLN
60.0000 mL | Freq: Once | CUTANEOUS | Status: AC
  Administered 2023-07-26: 4 via TOPICAL
  Filled 2023-07-26: qty 60

## 2023-07-26 MED ORDER — LIDOCAINE HCL (PF) 1 % IJ SOLN
INTRAMUSCULAR | Status: AC
  Filled 2023-07-26: qty 60

## 2023-07-26 MED ORDER — CHLORHEXIDINE GLUCONATE 4 % EX SOLN
60.0000 mL | Freq: Once | CUTANEOUS | Status: AC
  Administered 2023-07-26: 4 via TOPICAL

## 2023-07-26 MED ORDER — SODIUM CHLORIDE 0.9 % IV SOLN
INTRAVENOUS | Status: AC
  Filled 2023-07-26: qty 2

## 2023-07-26 MED ORDER — HEPARIN (PORCINE) IN NACL 1000-0.9 UT/500ML-% IV SOLN
INTRAVENOUS | Status: DC | PRN
  Administered 2023-07-26: 500 mL

## 2023-07-26 MED ORDER — CEFAZOLIN SODIUM-DEXTROSE 2-4 GM/100ML-% IV SOLN
INTRAVENOUS | Status: AC
  Filled 2023-07-26: qty 100

## 2023-07-26 SURGICAL SUPPLY — 15 items
CABLE SURGICAL S-101-97-12 (CABLE) ×1 IMPLANT
CATH CPS LOCATOR 3D MED (CATHETERS) IMPLANT
CATH CPS LOCATOR 3D SM (CATHETERS) IMPLANT
HELIX LOCKING TOOL (MISCELLANEOUS) ×1 IMPLANT
LEAD ULTIPACE 52 LPA1231/52 (Lead) IMPLANT
LEAD ULTIPACE 65 LPA1231/65 (Lead) IMPLANT
PACEMAKER ASSURITY DR-RF (Pacemaker) IMPLANT
PAD DEFIB RADIO PHYSIO CONN (PAD) ×1 IMPLANT
SHEATH 7FR PRELUDE SNAP 13 (SHEATH) IMPLANT
SHEATH 9FR PRELUDE SNAP 13 (SHEATH) IMPLANT
SHEATH PROBE COVER 6X72 (BAG) IMPLANT
SLITTER AGILIS HISPRO (INSTRUMENTS) IMPLANT
TOOL HELIX LOCKING (MISCELLANEOUS) IMPLANT
TRAY PACEMAKER INSERTION (PACKS) ×1 IMPLANT
WIRE GUIDERIGHT .032X150 (WIRE) IMPLANT

## 2023-07-26 NOTE — Progress Notes (Signed)
 Chaplain responded to a consult list request, to provide AD education. Pt received then information needed. Pt requested some time to have Pt's daughter fill out the form for her. Chaplain asked Pt to notify this office when the form is ready for notarization.  Oneida Alar Chaplain Resident   07/26/23 1042  Spiritual Encounters  Type of Visit Initial  Care provided to: Patient  Referral source Clinical staff  Reason for visit Advance directives  OnCall Visit No

## 2023-07-26 NOTE — Plan of Care (Signed)
   Problem: Education: Goal: Knowledge of General Education information will improve Description Including pain rating scale, medication(s)/side effects and non-pharmacologic comfort measures Outcome: Progressing   Problem: Health Behavior/Discharge Planning: Goal: Ability to manage health-related needs will improve Outcome: Progressing

## 2023-07-26 NOTE — Progress Notes (Signed)
 PROGRESS NOTE    Haley Mayer  WUJ:[id number] DOB: June 01, 1929 DOA: 07/25/2023 PCP: Sherron Monday, MD   Brief Narrative: Haley Mayer is a 89 y.o. female with medical history significant of type I second-degree AV block 2016, hypertension, hyperlipidemia, CKD stage IIIb, diabetes type 2, Parkinson disease presents after syncope episode.  Per HPI patient was at church and sitting and a bystander at her side found that she suddenly was  staring forward and unresponsive for about 3 to 5 seconds.  She recovered  consciousness but then soon developed another 2 similar episodes.  No loss of control of urine or bowel movement.  Patient denied any prodromal of lightheadedness, blurry vision or palpitation.   Evaluation at [facility] she was noted to have Mobitz type II second-degree AV block and cardiogenic syncope.  She was evaluated by cardiology at [facility] who recommended transfer to [facility] for EP evaluation and possible pacemaker placement.   Labs from 3/10: Sodium 135, BUN 28, creatinine 1.2, white blood cell 2.9, platelets 144 Cortisol level 10.2.  CT head: CT head no acute intracranial abnormality. Chest  x-ray: No acute cardiopulmonary disease.    Plan for pacemaker today    Assessment & Plan:   Principal Problem:   AV block, 2nd degree Active Problems:   Type 2 diabetes mellitus with obesity (HCC)   Essential hypertension   Chronic kidney disease, stage III (moderate) (HCC)   Syncope, cardiogenic  1-Cardiogenic syncope, Mobitz type II second-degree AV block, high-grade AV block: -Presented with multiple episode of syncope.  Found to have high degree AV block. -Was transferred from [facility] on 3/10.  Cardiology recommendation for pacemaker placement and evaluation by EP -TSH; 3.8, Mg 2.3, phosphorus 3.8 -EP plans for Pacemaker placement today.   2-Hypertension: Hold Norvasc, valsartan for now.  Avoid Hypotension.  PRN Hydralazine for SBP more than 160 Also hold valsartan due to  mild hyperkalemia on admission.    3-Diabetes type 2: Hold Invokana.  SSI   4-CKD stage IIIb: -CR baseline 1.4--1.5 -Renal function stable.   Monitor.    5-Parkinson's disease: Does not appears to be on medication     Leukopenia; B 12 795 Chronic, needs out patient follow up.  Mild Hyponatremia; monitor.  Mild hyperkalemia; resolved.    HLD; started statins.       Estimated body mass index is 30.89 kg/m as calculated from the following:   Height as of this encounter: 5\' 2"  (1.575 m).   Weight as of this encounter: 76.6 kg.   DVT prophylaxis:  Code Status: Full code Family Communication: Daughter on 3/10 Disposition Plan:  Status is: Inpatient Remains inpatient appropriate because: plan for pacemaker today     Consultants:  Cardiology   Procedures:  ECHO  Antimicrobials:    Subjective: She is alert, denies dyspnea, or pain. No further episodes.   Objective: Vitals:   07/25/23 1815 07/25/23 1918 07/26/23 0049 07/26/23 0400  BP:  (!) 156/72 (!) 127/55 (!) 158/64  Pulse:  80 65 71  Resp:  16 15 16   Temp:  (!) 97.3 F (36.3 C)  98.6 F (37 C)  TempSrc:  Oral  Oral  SpO2:  100% 100% 100%  Weight: 74.3 kg   76.6 kg  Height: 5\' 2"  (1.575 m)       Intake/Output Summary (Last 24 hours) at 07/26/2023 0715 Last data filed at 07/26/2023 0402 Gross per 24 hour  Intake 120 ml  Output 400 ml  Net -280 ml  Filed Weights   07/25/23 1815 07/26/23 0400  Weight: 74.3 kg 76.6 kg    Examination:  General exam: Appears calm and comfortable  Respiratory system: Clear to auscultation. Respiratory effort normal. Cardiovascular system: S1 & S2 heard, RRR. No JVD, murmurs, rubs, gallops or clicks. No pedal edema. Gastrointestinal system: Abdomen is nondistended, soft and nontender. No organomegaly or masses felt. Normal bowel sounds heard. Central nervous system: Alert and oriented.  Extremities: Symmetric 5 x 5 power.  Data Reviewed: I have personally  reviewed following labs and imaging studies  CBC: Recent Labs  Lab 07/24/23 1128 07/25/23 0822 07/26/23 0320  WBC 2.8* 2.9* 3.3*  NEUTROABS  --  1.3*  --   HGB 13.4 13.2 12.3  HCT 39.9 38.8 36.2  MCV 96.1 95.1 93.8  PLT 152 144* 148*   Basic Metabolic Panel: Recent Labs  Lab 07/24/23 1128 07/24/23 1400 07/25/23 0822 07/26/23 0320  NA 132*  --  135 132*  K 5.4*  --  4.0 4.0  CL 100  --  102 101  CO2 24  --  25 23  GLUCOSE 115*  --  92 98  BUN 32*  --  28* 27*  CREATININE 1.44*  --  1.24* 1.56*  CALCIUM 9.5  --  8.9 8.8*  MG 2.3  --   --   --   PHOS  --  3.8  --   --    GFR: Estimated Creatinine Clearance: 22.5 mL/min (A) (by C-G formula based on SCr of 1.56 mg/dL (H)). Liver Function Tests: Recent Labs  Lab 07/26/23 0320  AST 17  ALT 14  ALKPHOS 48  BILITOT 0.7  PROT 6.7  ALBUMIN 2.8*   No results for input(s): "LIPASE", "AMYLASE" in the last 168 hours. No results for input(s): "AMMONIA" in the last 168 hours. Coagulation Profile: Recent Labs  Lab 07/26/23 0320  INR 1.0   Cardiac Enzymes: No results for input(s): "CKTOTAL", "CKMB", "CKMBINDEX", "TROPONINI" in the last 168 hours. BNP (last 3 results) No results for input(s): "PROBNP" in the last 8760 hours. HbA1C: No results for input(s): "HGBA1C" in the last 72 hours. CBG: Recent Labs  Lab 07/25/23 0445 07/25/23 1144 07/25/23 1227 07/25/23 2149 07/26/23 0546  GLUCAP 74 66* 117* 137* 76   Lipid Profile: No results for input(s): "CHOL", "HDL", "LDLCALC", "TRIG", "CHOLHDL", "LDLDIRECT" in the last 72 hours. Thyroid Function Tests: Recent Labs    07/24/23 1400  TSH 3.826   Anemia Panel: Recent Labs    07/25/23 1750  VITAMINB12 795   Sepsis Labs: No results for input(s): "PROCALCITON", "LATICACIDVEN" in the last 168 hours.  Recent Results (from the past 240 hours)  Resp panel by RT-PCR (RSV, Flu A&B, Covid) Anterior Nasal Swab     Status: None   Collection Time: 07/24/23  1:43 PM    Specimen: Anterior Nasal Swab  Result Value Ref Range Status   SARS Coronavirus 2 by RT PCR NEGATIVE NEGATIVE Final    Comment: (NOTE) SARS-CoV-2 target nucleic acids are NOT DETECTED.  The SARS-CoV-2 RNA is generally detectable in upper respiratory specimens during the acute phase of infection. The lowest concentration of SARS-CoV-2 viral copies this assay can detect is 138 copies/mL. A negative result does not preclude SARS-Cov-2 infection and should not be used as the sole basis for treatment or other patient management decisions. A negative result may occur with  improper specimen collection/handling, submission of specimen other than nasopharyngeal swab, presence of viral mutation(s) within the areas targeted  by this assay, and inadequate number of viral copies(<138 copies/mL). A negative result must be combined with clinical observations, patient history, and epidemiological information. The expected result is Negative.  Fact Sheet for Patients:  BloggerCourse.com  Fact Sheet for Healthcare Providers:  SeriousBroker.it  This test is no t yet approved or cleared by the [place] FDA and  has been authorized for detection and/or diagnosis of SARS-CoV-2 by FDA under an Emergency Use Authorization (EUA). This EUA will remain  in effect (meaning this test can be used) for the duration of the COVID-19 declaration under Section 564(b)(1) of the Act, 21 U.S.C.section 360bbb-3(b)(1), unless the authorization is terminated  or revoked sooner.       Influenza A by PCR NEGATIVE NEGATIVE Final   Influenza B by PCR NEGATIVE NEGATIVE Final    Comment: (NOTE) The Xpert Xpress SARS-CoV-2/FLU/RSV plus assay is intended as an aid in the diagnosis of influenza from Nasopharyngeal swab specimens and should not be used as a sole basis for treatment. Nasal washings and aspirates are unacceptable for Xpert Xpress  SARS-CoV-2/FLU/RSV testing.  Fact Sheet for Patients: BloggerCourse.com  Fact Sheet for Healthcare Providers: SeriousBroker.it  This test is not yet approved or cleared by the [place] FDA and has been authorized for detection and/or diagnosis of SARS-CoV-2 by FDA under an Emergency Use Authorization (EUA). This EUA will remain in effect (meaning this test can be used) for the duration of the COVID-19 declaration under Section 564(b)(1) of the Act, 21 U.S.C. section 360bbb-3(b)(1), unless the authorization is terminated or revoked.     Resp Syncytial Virus by PCR NEGATIVE NEGATIVE Final    Comment: (NOTE) Fact Sheet for Patients: BloggerCourse.com  Fact Sheet for Healthcare Providers: SeriousBroker.it  This test is not yet approved or cleared by the [place] FDA and has been authorized for detection and/or diagnosis of SARS-CoV-2 by FDA under an Emergency Use Authorization (EUA). This EUA will remain in effect (meaning this test can be used) for the duration of the COVID-19 declaration under Section 564(b)(1) of the Act, 21 U.S.C. section 360bbb-3(b)(1), unless the authorization is terminated or revoked.  Performed at [facility], [address] [place], [place] [address]          Radiology Studies: X-ray chest PA and lateral Result Date: 07/24/2023 CLINICAL DATA:  Syncope. EXAM: CHEST - 2 VIEW COMPARISON:  September 05, 2007. FINDINGS: The heart size and mediastinal contours are within normal limits. Hypoinflation of the lungs is noted. Both lungs are clear. The visualized skeletal structures are unremarkable. IMPRESSION: No active cardiopulmonary disease. Electronically Signed   By: Lupita Raider M.D.   On: 07/24/2023 16:52   CT Head Wo Contrast Result Date: 07/24/2023 CLINICAL DATA:  Syncope. EXAM: CT HEAD WITHOUT CONTRAST TECHNIQUE: Contiguous axial  images were obtained from the base of the skull through the vertex without intravenous contrast. RADIATION DOSE REDUCTION: This exam was performed according to the departmental dose-optimization program which includes automated exposure control, adjustment of the mA and/or kV according to patient size and/or use of iterative reconstruction technique. COMPARISON:  11/18/2021 FINDINGS: Brain: No evidence of intracranial hemorrhage, acute infarction, hydrocephalus, extra-axial collection, or mass lesion/mass effect. Mild chronic small vessel disease, without significant change. Vascular:  No hyperdense vessel or other acute findings. Skull: No evidence of fracture or other significant bone abnormality. Sinuses/Orbits:  No acute findings. Other: None. IMPRESSION: No acute intracranial abnormality. Mild chronic small vessel disease. Electronically Signed   By: Dietrich Pates.D.  On: 07/24/2023 15:19        Scheduled Meds:  docusate sodium  100 mg Oral BID   heparin  5,000 Units Subcutaneous Q8H   insulin aspart  0-6 Units Subcutaneous TID WC   simvastatin  10 mg Oral QHS   Continuous Infusions:  lactated ringers       LOS: 1 day    Time spent: 35 minutes    Eoghan Belcher A Serah Nicoletti, MD [facility]   If 7PM-7AM, please contact night-coverage [email or web address]  07/26/2023, 7:15 AM

## 2023-07-26 NOTE — Progress Notes (Addendum)
 Patient Name: Haley Mayer Date of Encounter: 07/26/2023  Primary Cardiologist: None Electrophysiologist: None  Interval Summary   NAEON. Feeling well this Haley Mayer without complaint. NO chest pain, chest pressure, SOB.    Vital Signs    Vitals:   07/25/23 1918 07/26/23 0049 07/26/23 0400 07/26/23 0733  BP: (!) 156/72 (!) 127/55 (!) 158/64 (!) 138/46  Pulse: 80 65 71 68  Resp: 16 15 16 18   Temp: (!) 97.3 F (36.3 C)  98.6 F (37 C) 98.4 F (36.9 C)  TempSrc: Oral  Oral Oral  SpO2: 100% 100% 100% 96%  Weight:   76.6 kg   Height:        Intake/Output Summary (Last 24 hours) at 07/26/2023 0934 Last data filed at 07/26/2023 0402 Gross per 24 hour  Intake 120 ml  Output 400 ml  Net -280 ml   Filed Weights   07/25/23 1815 07/26/23 0400  Weight: 74.3 kg 76.6 kg    Physical Exam    GEN- The patient is well appearing, alert and oriented x 3 today.   Lungs- Clear to ausculation bilaterally, normal work of breathing Cardiac- Irregularly irregular rate and rhythm, no murmurs, rubs or gallops GI- soft, NT, ND, + BS Extremities- no clubbing or cyanosis. No edema  Telemetry    Wenckebach with intermittent 2:1 HB (personally reviewed)  Hospital Course    Haley Mayer is a 89 y.o. female with PMH of LBBB, HLD, HTN, CKD, T2DM admitted for syncope.  She was found to be in Mobitz type 2 with intermittent 2:1 HB. Initially admitted to [facility] and has been transferred to [facility] for PPM implant.  Assessment & Plan    #) syncope #) LBBB #) Mobitz 2 Presented to [facility] after multiple syncopal episodes Tele concerning for Mobitz 2 with intermittent 2:1 heart block No reversible causes identified, no AVN blocking agents, TSH normal  Recommended PPM implant. Reviewed risks/ benefits of procedure and patient verbalized understanding and wished to proceed.  She requested I speak to hear daughter to review procedure, which I Haley Trego do.   NPO for procedure today.          For  questions or updates, please contact [facility] Please consult [email or web address] for contact info under Cardiology/STEMI.  Signed, Haley Don, NP  07/26/2023, 9:34 Haley Mayer   I have seen and examined this patient with Haley Mayer.  Agree with above, note added to reflect my findings.  She has a history of left bundle branch block, hyperlipidemia, hypertension, CKD, diabetes.  She presented to the hospital with 3 syncopal episodes while at church.  These were all brief and did not have any preceding symptoms.  On presentation the emergency room, she was found to be in second-degree AV block with an underlying left bundle branch block.  Currently she feels well lying in bed.  She has no acute complaints.  GEN: Well nourished, well developed, in no acute distress  HEENT: normal  Neck: no JVD, carotid bruits, or masses Cardiac: RRR; no murmurs, rubs, or gallops,no edema  Respiratory:  clear to auscultation bilaterally, normal work of breathing GI: soft, nontender, nondistended, + BS MS: no deformity or atrophy  Skin: warm and dry, Neuro:  Strength and sensation are intact Psych: euthymic mood, full affect   Syncope Left bundle branch block Second-degree AV block With her advanced conduction system disease and history of syncope, she would benefit from pacemaker implant for prevention of further episodes of syncope.  She has no reversible  causes for bradycardia.  Due to that, we Haley Mayer plan for pacemaker implant today.  Risks and benefits have been discussed.  The patient understands these risks and is agreed to the procedure.  AMRITA RADU has presented today for surgery, with the diagnosis of syncope, second-degree AV block, left bundle branch block.  The various methods of treatment have been discussed with the patient and family. After consideration of risks, benefits and other options for treatment, the patient has consented to  Procedure(s): Pacemaker implant as a surgical intervention .   Risks include but not limited to bleeding, infection, pneumothorax, perforation, tamponade, vascular damage, renal failure, MI, stroke, death, and lead dislodgement . The patient's history has been reviewed, patient examined, no change in status, stable for surgery.  I have reviewed the patient's chart and labs.  Questions were answered to the patient's satisfaction.     Heiley Shaikh M. Amirr Achord MD 07/26/2023 1:34 PM

## 2023-07-26 NOTE — Plan of Care (Signed)
  Problem: Education: Goal: Knowledge of General Education information will improve Description: Including pain rating scale, medication(s)/side effects and non-pharmacologic comfort measures Outcome: Progressing   Problem: Health Behavior/Discharge Planning: Goal: Ability to manage health-related needs will improve Outcome: Progressing   Problem: Clinical Measurements: Goal: Ability to maintain clinical measurements within normal limits will improve Outcome: Progressing Goal: Will remain free from infection Outcome: Progressing Goal: Diagnostic test results will improve Outcome: Progressing Goal: Respiratory complications will improve Outcome: Progressing Goal: Cardiovascular complication will be avoided Outcome: Progressing   Problem: Activity: Goal: Risk for activity intolerance will decrease Outcome: Progressing   Problem: Nutrition: Goal: Adequate nutrition will be maintained Outcome: Progressing   Problem: Coping: Goal: Level of anxiety will decrease Outcome: Progressing   Problem: Elimination: Goal: Will not experience complications related to bowel motility Outcome: Progressing Goal: Will not experience complications related to urinary retention Outcome: Progressing   Problem: Pain Managment: Goal: General experience of comfort will improve and/or be controlled Outcome: Progressing   Problem: Safety: Goal: Ability to remain free from injury will improve Outcome: Progressing   Problem: Skin Integrity: Goal: Risk for impaired skin integrity will decrease Outcome: Progressing   Problem: Education: Goal: Ability to describe self-care measures that may prevent or decrease complications (Diabetes Survival Skills Education) will improve Outcome: Progressing Goal: Individualized Educational Video(s) Outcome: Progressing   Problem: Coping: Goal: Ability to adjust to condition or change in health will improve Outcome: Progressing   Problem: Fluid  Volume: Goal: Ability to maintain a balanced intake and output will improve Outcome: Progressing   Problem: Health Behavior/Discharge Planning: Goal: Ability to identify and utilize available resources and services will improve Outcome: Progressing Goal: Ability to manage health-related needs will improve Outcome: Progressing   Problem: Metabolic: Goal: Ability to maintain appropriate glucose levels will improve Outcome: Progressing   Problem: Nutritional: Goal: Maintenance of adequate nutrition will improve Outcome: Progressing Goal: Progress toward achieving an optimal weight will improve Outcome: Progressing   Problem: Skin Integrity: Goal: Risk for impaired skin integrity will decrease Outcome: Progressing   Problem: Tissue Perfusion: Goal: Adequacy of tissue perfusion will improve Outcome: Progressing   Problem: Education: Goal: Knowledge of cardiac device and self-care will improve Outcome: Progressing Goal: Ability to safely manage health related needs after discharge will improve Outcome: Progressing Goal: Individualized Educational Video(s) Outcome: Progressing   Problem: Cardiac: Goal: Ability to achieve and maintain adequate cardiopulmonary perfusion will improve Outcome: Progressing

## 2023-07-26 NOTE — Discharge Instructions (Addendum)
 After Your Pacemaker   You have a Abbott Pacemaker  ACTIVITY Do not lift your arm above shoulder height for 1 week after your procedure. After 7 days, you may progress as below.  You should remove your sling 24 hours after your procedure, unless otherwise instructed by your provider.     Tuesday August 02, 2023  Wednesday August 03, 2023 Thursday August 04, 2023 Friday August 05, 2023   Do not lift, push, pull, or carry anything over 10 pounds with the affected arm until 6 weeks (Tuesday September 06, 2023 ) after your procedure.   You may drive AFTER your wound check, unless you have been told otherwise by your provider.   Ask your healthcare provider when you can go back to work   INCISION/Dressing  If large square, outer bandage is left in place, this can be removed after 24 hours from your procedure. Do not remove steri-strips or glue as below.   If a PRESSURE DRESSING (a bulky dressing that usually goes up over your shoulder) was applied or left in place, please follow instructions given by your provider on when to return to have this removed.   Monitor your Pacemaker site for redness, swelling, and drainage. Call the device clinic at [phone] if you experience these symptoms or fever/chills.  If your incision is sealed with Steri-strips or staples, you may shower 7 days after your procedure or when told by your provider. Do not remove the steri-strips or let the shower hit directly on your site. You may wash around your site with soap and water.    If you were discharged in a sling, please do not wear this during the day more than 48 hours after your surgery unless otherwise instructed. This may increase the risk of stiffness and soreness in your shoulder.   Avoid lotions, ointments, or perfumes over your incision until it is well-healed.  You may use a hot tub or a pool AFTER your wound check appointment if the incision is completely closed.  Pacemaker Alerts:  Some alerts are  vibratory and others beep. These are NOT emergencies. Please call our office to let us know. If this occurs at night or on weekends, it can wait until the next business day. Send a remote transmission.  If your device is capable of reading fluid status (for heart failure), you will be offered monthly monitoring to review this with you.   DEVICE MANAGEMENT Remote monitoring is used to monitor your pacemaker from home. This monitoring is scheduled every 91 days by our office. It allows [place] to keep an eye on the functioning of your device to ensure it is working properly. You will routinely see your Electrophysiologist annually (more often if necessary).   You should receive your ID card for your new device in 4-8 weeks. Keep this card with you at all times once received. Consider wearing a medical alert bracelet or necklace.  Your Pacemaker may be MRI compatible. This will be discussed at your next office visit/wound check.  You should avoid contact with strong electric or magnetic fields.   Do not use amateur (ham) radio equipment or electric (arc) welding torches. MP3 player headphones with magnets should not be used. Some devices are safe to use if held at least 12 inches (30 cm) from your Pacemaker. These include power tools, lawn mowers, and speakers. If you are unsure if something is safe to use, ask your health care provider.  When using your cell phone, hold it  to the ear that is on the opposite side from the Pacemaker. Do not leave your cell phone in a pocket over the Pacemaker.  You may safely use electric blankets, heating pads, computers, and microwave ovens.  Call the office right away if: You have chest pain. You feel more short of breath than you have felt before. You feel more light-headed than you have felt before. Your incision starts to open up.  This information is not intended to replace advice given to you by your health care provider. Make sure you discuss any questions you  have with your health care provider.

## 2023-07-27 ENCOUNTER — Encounter (HOSPITAL_COMMUNITY): Payer: Self-pay | Admitting: Cardiology

## 2023-07-27 ENCOUNTER — Inpatient Hospital Stay (HOSPITAL_COMMUNITY)

## 2023-07-27 DIAGNOSIS — I441 Atrioventricular block, second degree: Secondary | ICD-10-CM | POA: Diagnosis not present

## 2023-07-27 LAB — GLUCOSE, CAPILLARY
Glucose-Capillary: 89 mg/dL (ref 70–99)
Glucose-Capillary: 146 mg/dL — ABNORMAL HIGH (ref 70–99)

## 2023-07-27 NOTE — Discharge Summary (Incomplete Revision)
 [facility]  Physician Discharge Summary   Patient ID: Haley Mayer MRN: [id number] DOB/AGE: [birth year] 89 y.o.  Admit date: 07/25/2023 Discharge date:   07/27/2023   PCP: Sherron Monday, MD  DISCHARGE DIAGNOSES:    Syncope, cardiogenic   AV block, 2nd degree S/p pacemaker placement   Type 2 diabetes mellitus with obesity (HCC)   Essential hypertension   Chronic kidney disease, stage III (moderate) (HCC)    RECOMMENDATIONS FOR OUTPATIENT FOLLOW UP: Cardiology to arrange outpatient follow up.   Home Health:None yet  Equipment/Devices:None   CODE STATUS:Full Code   DISCHARGE CONDITION: fair  Diet recommendation: Heart healthy  INITIAL HISTORY: 89 y.o. female with medical history significant of type I second-degree AV block 2016, hypertension, hyperlipidemia, CKD stage IIIb, diabetes type 2, Parkinson disease presents after syncope episode.  Per HPI patient was at church and sitting and a bystander at her side found that she suddenly was  staring forward and unresponsive for about 3 to 5 seconds.  She recovered  consciousness but then soon developed another 2 similar episodes.  No loss of control of urine or bowel movement.  Patient denied any prodromal of lightheadedness, blurry vision or palpitation.   Evaluation at [facility] she was noted to have Mobitz type II second-degree AV block and cardiogenic syncope.  She was evaluated by cardiology at [facility] who recommended transfer to [facility] for EP evaluation and possible pacemaker placement.   Labs from 3/10: Sodium 135, BUN 28, creatinine 1.2, white blood cell 2.9, platelets 144 Cortisol level 10.2.  CT head: CT head no acute intracranial abnormality. Chest  x-ray: No acute cardiopulmonary disease.  Consultations: Cardiology  Procedures: Pacemaker placement    HOSPITAL COURSE:   Cardiogenic syncope, Mobitz type II second-degree AV block, high-grade AV block: -Presented with multiple episode of syncope.   Found to have high degree AV block. -Was transferred from [facility] on 3/10.  Cardiology recommendation for pacemaker placement and evaluation by EP -TSH; 3.8, Mg 2.3, phosphorus 3.8 -Underwent Pacemaker placement on 07/26/23   Hypertension: Resume home meds   Diabetes type 2:   CKD stage IIIb: -CR baseline 1.4--1.5   Parkinson's disease: Does not appears to be on medication    Leukopenia B12 795  Mild Hyponatremia Mild hyperkalemia Hyperlipidemia   Obesity Estimated body mass index is 30.24 kg/m as calculated from the following:   Height as of this encounter: 5\' 2"  (1.575 m).   Weight as of this encounter: 75 kg.  Ok for discharge once cleared by cardiology.   PERTINENT LABS:  The results of significant diagnostics from this hospitalization (including imaging, microbiology, ancillary and laboratory) are listed below for reference.    Microbiology: Recent Results (from the past 240 hours)  Resp panel by RT-PCR (RSV, Flu A&B, Covid) Anterior Nasal Swab     Status: None   Collection Time: 07/24/23  1:43 PM   Specimen: Anterior Nasal Swab  Result Value Ref Range Status   SARS Coronavirus 2 by RT PCR NEGATIVE NEGATIVE Final    Comment: (NOTE) SARS-CoV-2 target nucleic acids are NOT DETECTED.  The SARS-CoV-2 RNA is generally detectable in upper respiratory specimens during the acute phase of infection. The lowest concentration of SARS-CoV-2 viral copies this assay can detect is 138 copies/mL. A negative result does not preclude SARS-Cov-2 infection and should not be used as the sole basis for treatment or other patient management decisions. A negative result may occur with  improper specimen collection/handling, submission of specimen other than  nasopharyngeal swab, presence of viral mutation(s) within the areas targeted by this assay, and inadequate number of viral copies(<138 copies/mL). A negative result must be combined with clinical observations, patient history, and  epidemiological information. The expected result is Negative.  Fact Sheet for Patients:  BloggerCourse.com  Fact Sheet for Healthcare Providers:  SeriousBroker.it  This test is no t yet approved or cleared by the [place] FDA and  has been authorized for detection and/or diagnosis of SARS-CoV-2 by FDA under an Emergency Use Authorization (EUA). This EUA will remain  in effect (meaning this test can be used) for the duration of the COVID-19 declaration under Section 564(b)(1) of the Act, 21 U.S.C.section 360bbb-3(b)(1), unless the authorization is terminated  or revoked sooner.       Influenza A by PCR NEGATIVE NEGATIVE Final   Influenza B by PCR NEGATIVE NEGATIVE Final    Comment: (NOTE) The Xpert Xpress SARS-CoV-2/FLU/RSV plus assay is intended as an aid in the diagnosis of influenza from Nasopharyngeal swab specimens and should not be used as a sole basis for treatment. Nasal washings and aspirates are unacceptable for Xpert Xpress SARS-CoV-2/FLU/RSV testing.  Fact Sheet for Patients: BloggerCourse.com  Fact Sheet for Healthcare Providers: SeriousBroker.it  This test is not yet approved or cleared by the [place] FDA and has been authorized for detection and/or diagnosis of SARS-CoV-2 by FDA under an Emergency Use Authorization (EUA). This EUA will remain in effect (meaning this test can be used) for the duration of the COVID-19 declaration under Section 564(b)(1) of the Act, 21 U.S.C. section 360bbb-3(b)(1), unless the authorization is terminated or revoked.     Resp Syncytial Virus by PCR NEGATIVE NEGATIVE Final    Comment: (NOTE) Fact Sheet for Patients: BloggerCourse.com  Fact Sheet for Healthcare Providers: SeriousBroker.it  This test is not yet approved or cleared by the [place] FDA and has been  authorized for detection and/or diagnosis of SARS-CoV-2 by FDA under an Emergency Use Authorization (EUA). This EUA will remain in effect (meaning this test can be used) for the duration of the COVID-19 declaration under Section 564(b)(1) of the Act, 21 U.S.C. section 360bbb-3(b)(1), unless the authorization is terminated or revoked.  Performed at [facility], [address], [place], [place] [address]   Surgical PCR screen     Status: Abnormal   Collection Time: 07/26/23  7:27 AM   Specimen: Nasal Mucosa; Nasal Swab  Result Value Ref Range Status   MRSA, PCR NEGATIVE NEGATIVE Final   Staphylococcus aureus POSITIVE (A) NEGATIVE Final    Comment: (NOTE) The Xpert SA Assay (FDA approved for NASAL specimens in patients 43 years of age and older), is one component of a comprehensive surveillance program. It is not intended to diagnose infection nor to guide or monitor treatment. Performed at [facility], [address], [place], [place] [address]      Labs:   Basic Metabolic Panel: Recent Labs  Lab 07/24/23 1128 07/24/23 1400 07/25/23 0822 07/26/23 0320  NA 132*  --  135 132*  K 5.4*  --  4.0 4.0  CL 100  --  102 101  CO2 24  --  25 23  GLUCOSE 115*  --  92 98  BUN 32*  --  28* 27*  CREATININE 1.44*  --  1.24* 1.56*  CALCIUM 9.5  --  8.9 8.8*  MG 2.3  --   --   --   PHOS  --  3.8  --   --  Liver Function Tests: Recent Labs  Lab 07/26/23 0320  AST 17  ALT 14  ALKPHOS 48  BILITOT 0.7  PROT 6.7  ALBUMIN 2.8*    CBC: Recent Labs  Lab 07/24/23 1128 07/25/23 0822 07/26/23 0320  WBC 2.8* 2.9* 3.3*  NEUTROABS  --  1.3*  --   HGB 13.4 13.2 12.3  HCT 39.9 38.8 36.2  MCV 96.1 95.1 93.8  PLT 152 144* 148*    CBG: Recent Labs  Lab 07/26/23 1614 07/26/23 1922 07/26/23 2101 07/27/23 0554 07/27/23 1153  GLUCAP 63* 159* 122* 89 146*     IMAGING STUDIES DG Chest 2 View Result Date: 07/27/2023 CLINICAL DATA:  Status post pacemaker  placement EXAM: CHEST - 2 VIEW COMPARISON:  07/24/2023 FINDINGS: Cardiac shadow is within normal limits. Lungs are well aerated bilaterally. No focal infiltrate or effusion is seen. No bony abnormality is noted. New pacemaker is noted on the left. No evidence of pneumothorax is seen. IMPRESSION: No pneumothorax following pacemaker placement. Electronically Signed   By: Alcide Clever M.D.   On: 07/27/2023 10:45   EP PPM/ICD IMPLANT Result Date: 07/26/2023 SURGEON:  Loman Brooklyn, MD   PREPROCEDURE DIAGNOSIS:  second degree AV block, syncope   POSTPROCEDURE DIAGNOSIS:  second degree AV block, syncope    PROCEDURES:  1. Pacemaker implantation.   INTRODUCTION:  ALINE WESCHE is a 89 y.o. female with a history of bradycardia who presents today for pacemaker implantation.  The patient reports intermittent episodes of dizziness over the past few months.  No reversible causes have been identified.  The patient therefore presents today for pacemaker implantation.   DESCRIPTION OF PROCEDURE:  Informed written consent was obtained, and  the patient was brought to the electrophysiology lab in a fasting state.  The patient required no sedation for the procedure today.  The patients left chest was prepped and draped in the usual sterile fashion by the EP lab staff. The skin overlying the left deltopectoral region was infiltrated with lidocaine for local analgesia.  A 4-cm incision was made over the left deltopectoral region.  A left subcutaneous pacemaker pocket was fashioned using a combination of sharp and blunt dissection. Electrocautery was required to assure hemostasis.  RA/RV Lead Placement: The left axillary vein was therefore cannulated.  Through the left axillary vein, a Abbott Ultipace 1231-52  (serial number  [id number]) right atrial lead and an Abbott Ultipace 1231-65 (serial number  [id number]) right ventricular lead were advanced with fluoroscopic visualization into the right atrial appendage and left bundle area  positions respectively.  Initial atrial lead P- waves measured 4.7 mV with impedance of 450 ohms and a threshold of 0.75 V at 0.5 msec.  Right ventricular lead R-waves measured >12 mV with an impedance of 630 ohms and a threshold of 1 V at 0.5 msec.  Both leads were secured to the pectoralis fascia using #2-0 silk over the suture sleeves. Device Placement:  The leads were then connected to an Abbott Assurity [id number]  (serial number  [id number] ) pacemaker.  The pocket was irrigated with copious gentamicin solution.  The pacemaker was then placed into the pocket.  The pocket was then closed in 3 layers with 2.0 and 3.0 V-Loc suture for the subcutaneous layers and 3.0 Vicryl suture for the subcuticular layers.  Steri-  Strips and a sterile dressing were then applied. EBL<85ml.  There were no early apparent complications.   CONCLUSIONS:  1. Successful implantation of a Abbott Assurity [id number] dual-chamber pacemaker for symptomatic  bradycardia  2. No early apparent complications.   Will Elberta Fortis, MD 07/26/2023 3:09 PM  ECHOCARDIOGRAM COMPLETE Result Date: 07/26/2023    ECHOCARDIOGRAM REPORT   Patient Name:   JAYDN FINCHER Date of Exam: 07/25/2023 Medical Rec #:  [id number]      Height:       62.0 in Accession #:    [id number]     Weight:       164.9 lb Date of Birth:  1929-12-02      BSA:          1.761 m Patient Age:    89 years       BP:           132/60 mmHg Patient Gender: F              HR:           52 bpm. Exam Location:  [facility] Procedure: 2D Echo, Cardiac Doppler and Color Doppler (Both Spectral and Color            Flow Doppler were utilized during procedure). Indications:     Syncope  History:         Patient has no prior history of Echocardiogram examinations.                  Arrythmias:LBBB and Bradycardia, Signs/Symptoms:Syncope; Risk                  Factors:Hypertension, Diabetes and Dyslipidemia. CKD.  Sonographer:     Mikki Harbor Referring Phys:  [id number] Emeline General Diagnosing Phys: Yvonne Kendall MD  IMPRESSIONS  1. Left ventricular ejection fraction, by estimation, is 55 to 60%. The left ventricle has normal function. The left ventricle has no regional wall motion abnormalities. There is mild left ventricular hypertrophy. Left ventricular diastolic parameters are indeterminate.  2. Right ventricular systolic function is normal. The right ventricular size is normal.  3. Left atrial size was moderately dilated.  4. The mitral valve is degenerative. Mild mitral valve regurgitation. No evidence of mitral stenosis.  5. The aortic valve is tricuspid. There is mild thickening of the aortic valve. Aortic valve regurgitation is trivial. Aortic valve sclerosis is present, with no evidence of aortic valve stenosis. FINDINGS  Left Ventricle: Left ventricular ejection fraction, by estimation, is 55 to 60%. The left ventricle has normal function. The left ventricle has no regional wall motion abnormalities. The left ventricular internal cavity size was normal in size. There is  mild left ventricular hypertrophy. Abnormal (paradoxical) septal motion, consistent with left bundle branch block. Left ventricular diastolic parameters are indeterminate. Right Ventricle: Pulmonary artery pressure is probably normal (RVSP 25 mmHg plus central venous/right atrial pressure). The right ventricular size is normal. No increase in right ventricular wall thickness. Right ventricular systolic function is normal. Left Atrium: Left atrial size was moderately dilated. Right Atrium: Right atrial size was normal in size. Pericardium: There is no evidence of pericardial effusion. Mitral Valve: The mitral valve is degenerative in appearance. There is mild thickening of the mitral valve leaflet(s). Mild mitral annular calcification. Mild mitral valve regurgitation. No evidence of mitral valve stenosis. MV peak gradient, 5.4 mmHg. The mean mitral valve gradient is 2.0 mmHg. Tricuspid Valve: The tricuspid valve is grossly normal. Tricuspid valve  regurgitation is mild. Aortic Valve: The aortic valve is tricuspid. There is mild thickening of the aortic valve. Aortic valve regurgitation is trivial. Aortic valve sclerosis is present, with no evidence of aortic valve stenosis. Aortic  valve mean gradient measures 3.0 mmHg. Aortic valve peak gradient measures 6.2 mmHg. Aortic valve area, by VTI measures 2.51 cm. Pulmonic Valve: The pulmonic valve was normal in structure. Pulmonic valve regurgitation is trivial. No evidence of pulmonic stenosis. Aorta: The aortic root is normal in size and structure. Pulmonary Artery: The pulmonary artery is of normal size. Venous: The inferior vena cava was not well visualized. IAS/Shunts: The interatrial septum was not well visualized.  LEFT VENTRICLE PLAX 2D LVIDd:         3.60 cm   Diastology LVIDs:         2.50 cm   LV e' medial:    5.44 cm/s LV PW:         1.10 cm   LV E/e' medial:  14.2 LV IVS:        1.30 cm   LV e' lateral:   9.14 cm/s LVOT diam:     2.00 cm   LV E/e' lateral: 8.4 LV SV:         88 LV SV Index:   50 LVOT Area:     3.14 cm  RIGHT VENTRICLE RV Basal diam:  3.10 cm RV Mid diam:    3.20 cm RV S prime:     10.40 cm/s TAPSE (M-mode): 2.5 cm LEFT ATRIUM             Index        RIGHT ATRIUM           Index LA diam:        4.00 cm 2.27 cm/m   RA Area:     13.70 cm LA Vol (A2C):   91.9 ml 52.18 ml/m  RA Volume:   36.50 ml  20.72 ml/m LA Vol (A4C):   70.1 ml 39.80 ml/m LA Biplane Vol: 83.7 ml 47.53 ml/m  AORTIC VALVE                    PULMONIC VALVE AV Area (Vmax):    2.15 cm     PV Vmax:       1.27 m/s AV Area (Vmean):   2.33 cm     PV Peak grad:  6.5 mmHg AV Area (VTI):     2.51 cm AV Vmax:           125.00 cm/s AV Vmean:          80.100 cm/s AV VTI:            0.352 m AV Peak Grad:      6.2 mmHg AV Mean Grad:      3.0 mmHg LVOT Vmax:         85.40 cm/s LVOT Vmean:        59.400 cm/s LVOT VTI:          0.281 m LVOT/AV VTI ratio: 0.80  AORTA Ao Root diam: 3.00 cm MITRAL VALVE                TRICUSPID  VALVE MV Area (PHT): 4.99 cm     TR Peak grad:   25.2 mmHg MV Area VTI:   2.94 cm     TR Vmax:        251.00 cm/s MV Peak grad:  5.4 mmHg MV Mean grad:  2.0 mmHg     SHUNTS MV Vmax:       1.16 m/s     Systemic VTI:  0.28 m MV Vmean:      66.6  cm/s    Systemic Diam: 2.00 cm MV Decel Time: 152 msec MV E velocity: 77.00 cm/s MV A velocity: 111.00 cm/s MV E/A ratio:  0.69 Yvonne Kendall MD Electronically signed by Yvonne Kendall MD Signature Date/Time: 07/26/2023/7:48:16 AM    Final    X-ray chest PA and lateral Result Date: 07/24/2023 CLINICAL DATA:  Syncope. EXAM: CHEST - 2 VIEW COMPARISON:  September 05, 2007. FINDINGS: The heart size and mediastinal contours are within normal limits. Hypoinflation of the lungs is noted. Both lungs are clear. The visualized skeletal structures are unremarkable. IMPRESSION: No active cardiopulmonary disease. Electronically Signed   By: Lupita Raider M.D.   On: 07/24/2023 16:52   CT Head Wo Contrast Result Date: 07/24/2023 CLINICAL DATA:  Syncope. EXAM: CT HEAD WITHOUT CONTRAST TECHNIQUE: Contiguous axial images were obtained from the base of the skull through the vertex without intravenous contrast. RADIATION DOSE REDUCTION: This exam was performed according to the departmental dose-optimization program which includes automated exposure control, adjustment of the mA and/or kV according to patient size and/or use of iterative reconstruction technique. COMPARISON:  11/18/2021 FINDINGS: Brain: No evidence of intracranial hemorrhage, acute infarction, hydrocephalus, extra-axial collection, or mass lesion/mass effect. Mild chronic small vessel disease, without significant change. Vascular:  No hyperdense vessel or other acute findings. Skull: No evidence of fracture or other significant bone abnormality. Sinuses/Orbits:  No acute findings. Other: None. IMPRESSION: No acute intracranial abnormality. Mild chronic small vessel disease. Electronically Signed   By: Danae Orleans M.D.   On:  07/24/2023 15:19    DISCHARGE EXAMINATION: Vitals:   07/26/23 1900 07/26/23 2326 07/27/23 0410 07/27/23 0752  BP: (!) 136/46 (!) 127/59 (!) 136/50 (!) 140/72  Pulse:  68 70 70  Resp: 18 17 18 18   Temp: 98.4 F (36.9 C) 98.7 F (37.1 C) 98.7 F (37.1 C) 98.7 F (37.1 C)  TempSrc: Oral Oral Oral Oral  SpO2: 100% 99% 100% 96%  Weight:   75 kg   Height:       General appearance: alert, cooperative, appears stated age, and no distress Resp: clear to auscultation bilaterally Cardio: regular rate and rhythm, S1, S2 normal, no murmur, click, rub or gallop GI: soft, non-tender; bowel sounds normal; no masses,  no organomegaly  DISPOSITION: Home  Discharge Instructions     Call MD for:  difficulty breathing, headache or visual disturbances   Complete by: As directed    Call MD for:  extreme fatigue   Complete by: As directed    Call MD for:  persistant dizziness or light-headedness   Complete by: As directed    Call MD for:  persistant nausea and vomiting   Complete by: As directed    Call MD for:  severe uncontrolled pain   Complete by: As directed    Call MD for:  temperature >100.4   Complete by: As directed    Diet - low sodium heart healthy   Complete by: As directed    Discharge instructions   Complete by: As directed    Please follow instructions provided by cardiology.  Take your medications as prescribed.  You were cared for by a hospitalist during your hospital stay. If you have any questions about your discharge medications or the care you received while you were in the hospital after you are discharged, you can call the unit and asked to speak with the hospitalist on call if the hospitalist that took care of you is not available. Once you are discharged,  your primary care physician will handle any further medical issues. Please note that NO REFILLS for any discharge medications will be authorized once you are discharged, as it is imperative that you return to your  primary care physician (or establish a relationship with a primary care physician if you do not have one) for your aftercare needs so that they can reassess your need for medications and monitor your lab values. If you do not have a primary care physician, you can call [phone] for a physician referral.   Increase activity slowly   Complete by: As directed          Allergies as of 07/27/2023       Reactions   Aspirin Nausea And Vomiting   REGULAR STRENGTH= Also upset stomach    Enalapril Maleate Other (See Comments)   angioedema   Iodinated Contrast Media Other (See Comments)   Vomiting and hives   Iohexol Nausea And Vomiting   Loperamide Hives   Other Other (See Comments)   Tomatoes--acid   Primidone Other (See Comments)   unknown unknown   Sulfa Antibiotics Other (See Comments)   Vomiting and hives   Vasotec [enalapril]    unknown        Medication List     STOP taking these medications    docusate sodium 100 MG capsule Commonly known as: COLACE       TAKE these medications    acetaminophen 500 MG tablet Commonly known as: TYLENOL Take 500 mg by mouth every 6 (six) hours as needed.   amLODipine 10 MG tablet Commonly known as: NORVASC TAKE 1 TABLET BY MOUTH ONCE  DAILY   aspirin EC 81 MG tablet Take 1 tablet (81 mg total) by mouth 2 (two) times daily after a meal. What changed: when to take this   CALCIUM 1200 PO Take 1 tablet by mouth every morning.   canagliflozin 100 MG Tabs tablet Commonly known as: Invokana Take 1 tablet (100 mg total) by mouth every morning.   CENTRUM VITAMINTS PO Take 1 tablet by mouth daily.   diphenhydrAMINE 25 MG tablet Commonly known as: BENADRYL Take 50 mg by mouth every 6 (six) hours as needed for itching or allergies.   OneTouch Delica Lancets 33G Misc Apply topically.   OneTouch Ultra test strip Generic drug: glucose blood 1 each daily.   polyethylene glycol 17 g packet Commonly known as: MIRALAX /  GLYCOLAX Take 17 g by mouth daily.   simvastatin 10 MG tablet Commonly known as: ZOCOR TAKE 1 TABLET BY MOUTH EVERY  NIGHT AT BEDTIME   valsartan-hydrochlorothiazide 320-12.5 MG tablet Commonly known as: DIOVAN-HCT TAKE 1 TABLET BY MOUTH IN THE  MORNING          Follow-up Information     Sherron Monday, MD Follow up.   Specialty: Internal Medicine Why: Please follow up in a week. Contact information: [address] [place] [address] [phone]                 TOTAL DISCHARGE TIME: 35 minutes  Karessa Onorato Rito Ehrlich  [facility] Pager on [email or web address]  07/27/2023, 12:13 PM

## 2023-07-27 NOTE — Plan of Care (Signed)
   Problem: Activity: Goal: Risk for activity intolerance will decrease Outcome: Progressing   Problem: Coping: Goal: Level of anxiety will decrease Outcome: Progressing

## 2023-07-27 NOTE — Progress Notes (Addendum)
  Patient Name: Haley Mayer Date of Encounter: 07/27/2023  Primary Cardiologist: None Electrophysiologist: None  Interval Summary   NAEON. S/p PPM implant yesterday afternoon. High VP. At this time, the patient denies chest pain, shortness of breath, or any new concerns.  Vital Signs    Vitals:   07/26/23 1900 07/26/23 2326 07/27/23 0410 07/27/23 0752  BP: (!) 136/46 (!) 127/59 (!) 136/50 (!) 140/72  Pulse:  68 70 70  Resp: 18 17 18 18   Temp: 98.4 F (36.9 C) 98.7 F (37.1 C) 98.7 F (37.1 C) 98.7 F (37.1 C)  TempSrc: Oral Oral Oral Oral  SpO2: 100% 99% 100% 96%  Weight:   75 kg   Height:        Intake/Output Summary (Last 24 hours) at 07/27/2023 1221 Last data filed at 07/27/2023 0419 Gross per 24 hour  Intake 240 ml  Output 750 ml  Net -510 ml   Filed Weights   07/25/23 1815 07/26/23 0400 07/27/23 0410  Weight: 74.3 kg 76.6 kg 75 kg    Physical Exam    GEN- The patient is well appearing, alert and oriented x 3 today.   Lungs- Clear to ausculation bilaterally, normal work of breathing Cardiac- Regular rate and rhythm, no murmurs, rubs or gallops GI- soft, NT, ND, + BS Extremities- no clubbing or cyanosis. No edema L chest - slight edema, soft on palpitation. Outer bandage removed, steri-strips to remain in place  Telemetry    AS VP (personally reviewed)  Hospital Course    Haley Mayer is a 89 y.o. female with PMH of LBBB, HLD, HTN, CKD, T2DM admitted for syncope, found to be in Mobitz type 2 with intermittent 2:1 HB.  She was initially admitted at [facility], transferred to [facility] for PPM implant  She is now s/p PPM implant 3/11  Assessment & Plan    #) syncope #) LBBB #) Mobitz 2 Presented to [facility] after multiple syncopal episodes Tele concerning for Mobitz 2 with intermittent 2:1 heart block No reversible causes identified, no AVN blocking agents, TSH normal S/p PPM implant 3/11 by Dr. Elberta Fortis Device interrogation stable this AM with high VP CXR  without pneumo Implant site with slight edema, site is soft Reviewed wound care instructions, and L shoulder restrictions with patient and family.   Outpatient appts scheduled.   OK to discharge from EP perspective        For questions or updates, please contact [facility] Please consult [email or web address] for contact info under Cardiology/STEMI.  Signed, Haley Don, NP  07/27/2023, 12:21 PM   I have seen and examined this patient with Haley Mayer.  Agree with above, note added to reflect my findings.  On exam, regular rhythm, no murmurs, lungs clear.  She is now status post Abbott dual-chamber pacemaker for second-degree AV block and syncope.  Device functioning appropriately.  Sensing, threshold, impedance within normal limits as expected post implant.  Chest x-ray and interrogation without issue.  Okay for discharge today with follow-up in device clinic.  We Haley Mayer arrange for follow-up in EP clinic.   Haley Mayer M. Haley Karnes MD 07/27/2023 2:57 PM

## 2023-07-27 NOTE — Evaluation (Signed)
 Physical Therapy Evaluation Patient Details Name: ROSCHELLE Mayer MRN: [id number] DOB: 08-Nov-1929 Today's Date: 07/27/2023  History of Present Illness  Pt is a 89 y.o. female presenting 07/25/23 s/p three syncopal episodes while sitting at church. Admitted second-degree AV block. S/p pacemaker placement 07/26/23. PMHx includes, HTN, HLD, CKD stage IIIb, IIDM, Parkinson's disease  Clinical Impression  Pt is a 89 y.o. female presenting on 07/25/23 with above conditions and deficits below, see PT Problem List. PTA pt lived in a one story home with her daughter and son-in-law who helped her with ADLs & IADLs. Pt ambulated around her home with RW and in her community with a rollator. Currently pt is supervision for bed mobility, transfers, and gait with a rollator & RW. Pt displayed the ability to maintain L UE precautions throughout the session and was provided pacemaker precaution handout. Given the pt's PLOF, proximity to baseline, family support, and anticipated progression PT doesn't recommend follow-up PT or continued acute PT. All pt/family education and handouts provided. Will sign off from care.          If plan is discharge home, recommend the following: Assist for transportation;Assistance with cooking/housework;Help with stairs or ramp for entrance   Can travel by private vehicle        Equipment Recommendations None recommended by PT  Recommendations for Other Services  OT consult    Functional Status Assessment Patient has not had a recent decline in their functional status     Precautions / Restrictions Precautions Precautions: ICD/Pacemaker;Fall Recall of Precautions/Restrictions: Intact Precaution/Restrictions Comments: Pt's ICD was 07/26/23 Restrictions Weight Bearing Restrictions Per Provider Order: Yes LUE Weight Bearing Per Provider Order: Partial weight bearing LUE Partial Weight Bearing Percentage or Pounds: 5      Mobility  Bed Mobility Overal bed mobility: Needs  Assistance Bed Mobility: Supine to Sit     Supine to sit: Supervision, HOB elevated     General bed mobility comments: Supervision for cueing regarding pacemaker precautions. Pt required increased time. Transferred to R side of bed.    Transfers Overall transfer level: Needs assistance Equipment used: Rolling walker (2 wheels), Rollator (4 wheels) Transfers: Sit to/from Stand Sit to Stand: Supervision           General transfer comment: Pt performed 2 sit to stands, one with rollator, one with RW. Pt maintains pacemaker precautions during both transfers by placing L UE in her lap when standing and sitting. Slight increased trunk flexion when standing    Ambulation/Gait Ambulation/Gait assistance: Supervision Gait Distance (Feet): 110 Feet (2 bouts; 1st with rollator 85 ft, 2nd with RW 25 ft) Assistive device: Rolling walker (2 wheels), Rollator (4 wheels) Gait Pattern/deviations: Step-through pattern, Decreased stride length, Shuffle, Trendelenburg Gait velocity: reduced Gait velocity interpretation: <1.8 ft/sec, indicate of risk for recurrent falls   General Gait Details: Pt walks with L Trendelenburg gait with both rollator & RW; with the deviation being more noticable when walking with RW. Pt takes short, shuffling steps and maintains L UE precautions throughout ambulation  Stairs            Wheelchair Mobility     Tilt Bed    Modified Rankin (Stroke Patients Only)       Balance Overall balance assessment: Needs assistance Sitting-balance support: Feet supported, No upper extremity supported Sitting balance-Leahy Scale: Normal Sitting balance - Comments: pt sits EOB and in recliner without LOB or directional lean. Maintains L UE precautions throughout   Standing balance support: During functional activity,  Single extremity supported, Bilateral upper extremity supported Standing balance-Leahy Scale: Fair               High level balance activites:  Head turns High Level Balance Comments: Pt decreases gait speed with vertical head turns. Pt increases lateral sway with horizontal head turns             Pertinent Vitals/Pain Pain Assessment Pain Assessment: No/denies pain    Home Living Family/patient expects to be discharged to:: Private residence Living Arrangements: Children (daughter and son-in-law) Available Help at Discharge: Family;Available 24 hours/day Type of Home: House Home Access: Stairs to enter Entrance Stairs-Rails: Left (Back door) Entrance Stairs-Number of Steps: 3   Home Layout: One level Home Equipment: Agricultural consultant (2 wheels);Rollator (4 wheels);Cane - single point;Cane - quad;Shower seat;Grab bars - tub/shower;Grab bars - toilet Additional Comments: Pt's home has 2 STE in the front with rails on R & L and can reach both.    Prior Function Prior Level of Function : Needs assist;History of Falls (last six months) (2 falls, both when bending over to pick items off the floor.)       Physical Assist : Mobility (physical);ADLs (physical) Mobility (physical): Gait;Stairs ADLs (physical): IADLs Mobility Comments: mod I with rollator in community; RW at home ADLs Comments: modI for ADLs, family performs IADLs     Extremity/Trunk Assessment   Upper Extremity Assessment Upper Extremity Assessment: Defer to OT evaluation    Lower Extremity Assessment Lower Extremity Assessment: Generalized weakness    Cervical / Trunk Assessment Cervical / Trunk Assessment: Kyphotic  Communication   Communication Communication: Impaired Factors Affecting Communication: Hearing impaired    Cognition Arousal: Alert Behavior During Therapy: WFL for tasks assessed/performed   PT - Cognitive impairments: Orientation   Orientation impairments: Time                   PT - Cognition Comments: Pt is unaware of the year but family reported this is the pt's baseline Following commands: Intact       Cueing  Cueing Techniques: Verbal cues, Visual cues     General Comments General comments (skin integrity, edema, etc.): Pt had blood in the bandage over the pacemaker upon PT arrival. Bleeding slightly increased as pt came to EOB. No increased bleeding during other mobility. Pt's nurse was noticed    Exercises     Assessment/Plan    PT Assessment Patient does not need any further PT services  PT Problem List         PT Treatment Interventions      PT Goals (Current goals can be found in the Care Plan section)  Acute Rehab PT Goals Patient Stated Goal: return home PT Goal Formulation: All assessment and education complete, DC therapy Time For Goal Achievement: 08/10/23 Potential to Achieve Goals: Good    Frequency       Co-evaluation               AM-PAC PT "6 Clicks" Mobility  Outcome Measure Help needed turning from your back to your side while in a flat bed without using bedrails?: A Little Help needed moving from lying on your back to sitting on the side of a flat bed without using bedrails?: A Little Help needed moving to and from a bed to a chair (including a wheelchair)?: A Little Help needed standing up from a chair using your arms (e.g., wheelchair or bedside chair)?: A Little Help needed to walk in hospital room?:  A Little Help needed climbing 3-5 steps with a railing? : A Little 6 Click Score: 18    End of Session Equipment Utilized During Treatment: Gait belt Activity Tolerance: Patient tolerated treatment well Patient left: in chair;with call bell/phone within reach;with chair alarm set;with family/visitor present Nurse Communication: Mobility status;Other (comment) (pt's bleeding in bandage) PT Visit Diagnosis: Other abnormalities of gait and mobility (R26.89);History of falling (Z91.81);Muscle weakness (generalized) (M62.81)    Time: 1610-9604 PT Time Calculation (min) (ACUTE ONLY): 35 min   Charges:   PT Evaluation $PT Eval Low Complexity: 1 Low PT  Treatments $Therapeutic Activity: 8-22 mins PT General Charges $$ ACUTE PT VISIT: 1 Visit         [address], SPT   [place] 07/27/2023, 12:34 PM

## 2023-07-27 NOTE — Discharge Summary (Signed)
 [facility]  Physician Discharge Summary   Patient ID: Haley Mayer MRN: [id number] DOB/AGE: 1929/10/27 89 y.o.  Admit date: 07/25/2023 Discharge date: 07/27/2023  07/28/2023   PCP: Sherron Monday, MD  DISCHARGE DIAGNOSES:    Syncope, cardiogenic   AV block, 2nd degree S/p pacemaker placement   Type 2 diabetes mellitus with obesity (HCC)   Essential hypertension   Chronic kidney disease, stage III (moderate) (HCC)    RECOMMENDATIONS FOR OUTPATIENT FOLLOW UP: Cardiology to arrange outpatient follow up.   Home Health:None yet  Equipment/Devices:None   CODE STATUS:Full Code   DISCHARGE CONDITION: fair  Diet recommendation: Heart healthy  INITIAL HISTORY: 89 y.o. female with medical history significant of type I second-degree AV block 2016, hypertension, hyperlipidemia, CKD stage IIIb, diabetes type 2, Parkinson disease presents after syncope episode.  Per HPI patient was at church and sitting and a bystander at her side found that she suddenly was  staring forward and unresponsive for about 3 to 5 seconds.  She recovered  consciousness but then soon developed another 2 similar episodes.  No loss of control of urine or bowel movement.  Patient denied any prodromal of lightheadedness, blurry vision or palpitation.   Evaluation at [facility] she was noted to have Mobitz type II second-degree AV block and cardiogenic syncope.  She was evaluated by cardiology at [facility] who recommended transfer to [facility] for EP evaluation and possible pacemaker placement.   Labs from 3/10: Sodium 135, BUN 28, creatinine 1.2, white blood cell 2.9, platelets 144 Cortisol level 10.2.  CT head: CT head no acute intracranial abnormality. Chest  x-ray: No acute cardiopulmonary disease.  Consultations: Cardiology  Procedures: Pacemaker placement    HOSPITAL COURSE:   Cardiogenic syncope, Mobitz type II second-degree AV block, high-grade AV block: -Presented with multiple episode of  syncope.  Found to have high degree AV block. -Was transferred from [facility] on 3/10.  Cardiology recommendation for pacemaker placement and evaluation by EP -TSH; 3.8, Mg 2.3, phosphorus 3.8 -Underwent Pacemaker placement on 07/26/23   Hypertension: Resume home meds   Diabetes type 2:   CKD stage IIIb: -CR baseline 1.4--1.5   Parkinson's disease: Does not appears to be on medication    Leukopenia B12 795  Mild Hyponatremia Mild hyperkalemia Hyperlipidemia   Obesity Estimated body mass index is 30.24 kg/m as calculated from the following:   Height as of this encounter: 5\' 2"  (1.575 m).   Weight as of this encounter: 75 kg.  Ok for discharge once cleared by cardiology.   PERTINENT LABS:  The results of significant diagnostics from this hospitalization (including imaging, microbiology, ancillary and laboratory) are listed below for reference.    Microbiology: Recent Results (from the past 240 hours)  Resp panel by RT-PCR (RSV, Flu A&B, Covid) Anterior Nasal Swab     Status: None   Collection Time: 07/24/23  1:43 PM   Specimen: Anterior Nasal Swab  Result Value Ref Range Status   SARS Coronavirus 2 by RT PCR NEGATIVE NEGATIVE Final    Comment: (NOTE) SARS-CoV-2 target nucleic acids are NOT DETECTED.  The SARS-CoV-2 RNA is generally detectable in upper respiratory specimens during the acute phase of infection. The lowest concentration of SARS-CoV-2 viral copies this assay can detect is 138 copies/mL. A negative result does not preclude SARS-Cov-2 infection and should not be used as the sole basis for treatment or other patient management decisions. A negative result may occur with  improper specimen collection/handling, submission of specimen other than  nasopharyngeal swab, presence of viral mutation(s) within the areas targeted by this assay, and inadequate number of viral copies(<138 copies/mL). A negative result must be combined with clinical observations, patient  history, and epidemiological information. The expected result is Negative.  Fact Sheet for Patients:  BloggerCourse.com  Fact Sheet for Healthcare Providers:  SeriousBroker.it  This test is no t yet approved or cleared by the [place] FDA and  has been authorized for detection and/or diagnosis of SARS-CoV-2 by FDA under an Emergency Use Authorization (EUA). This EUA will remain  in effect (meaning this test can be used) for the duration of the COVID-19 declaration under Section 564(b)(1) of the Act, 21 U.S.C.section 360bbb-3(b)(1), unless the authorization is terminated  or revoked sooner.       Influenza A by PCR NEGATIVE NEGATIVE Final   Influenza B by PCR NEGATIVE NEGATIVE Final    Comment: (NOTE) The Xpert Xpress SARS-CoV-2/FLU/RSV plus assay is intended as an aid in the diagnosis of influenza from Nasopharyngeal swab specimens and should not be used as a sole basis for treatment. Nasal washings and aspirates are unacceptable for Xpert Xpress SARS-CoV-2/FLU/RSV testing.  Fact Sheet for Patients: BloggerCourse.com  Fact Sheet for Healthcare Providers: SeriousBroker.it  This test is not yet approved or cleared by the [place] FDA and has been authorized for detection and/or diagnosis of SARS-CoV-2 by FDA under an Emergency Use Authorization (EUA). This EUA will remain in effect (meaning this test can be used) for the duration of the COVID-19 declaration under Section 564(b)(1) of the Act, 21 U.S.C. section 360bbb-3(b)(1), unless the authorization is terminated or revoked.     Resp Syncytial Virus by PCR NEGATIVE NEGATIVE Final    Comment: (NOTE) Fact Sheet for Patients: BloggerCourse.com  Fact Sheet for Healthcare Providers: SeriousBroker.it  This test is not yet approved or cleared by the [place] FDA  and has been authorized for detection and/or diagnosis of SARS-CoV-2 by FDA under an Emergency Use Authorization (EUA). This EUA will remain in effect (meaning this test can be used) for the duration of the COVID-19 declaration under Section 564(b)(1) of the Act, 21 U.S.C. section 360bbb-3(b)(1), unless the authorization is terminated or revoked.  Performed at [facility], [address] [place], [place] [address]   Surgical PCR screen     Status: Abnormal   Collection Time: 07/26/23  7:27 AM   Specimen: Nasal Mucosa; Nasal Swab  Result Value Ref Range Status   MRSA, PCR NEGATIVE NEGATIVE Final   Staphylococcus aureus POSITIVE (A) NEGATIVE Final    Comment: (NOTE) The Xpert SA Assay (FDA approved for NASAL specimens in patients 52 years of age and older), is one component of a comprehensive surveillance program. It is not intended to diagnose infection nor to guide or monitor treatment. Performed at [facility], [address], [place], [place] [address]      Labs:   Basic Metabolic Panel: Recent Labs  Lab 07/24/23 1128 07/24/23 1400 07/25/23 0822 07/26/23 0320  NA 132*  --  135 132*  K 5.4*  --  4.0 4.0  CL 100  --  102 101  CO2 24  --  25 23  GLUCOSE 115*  --  92 98  BUN 32*  --  28* 27*  CREATININE 1.44*  --  1.24* 1.56*  CALCIUM 9.5  --  8.9 8.8*  MG 2.3  --   --   --   PHOS  --  3.8  --   --  Liver Function Tests: Recent Labs  Lab 07/26/23 0320  AST 17  ALT 14  ALKPHOS 48  BILITOT 0.7  PROT 6.7  ALBUMIN 2.8*    CBC: Recent Labs  Lab 07/24/23 1128 07/25/23 0822 07/26/23 0320  WBC 2.8* 2.9* 3.3*  NEUTROABS  --  1.3*  --   HGB 13.4 13.2 12.3  HCT 39.9 38.8 36.2  MCV 96.1 95.1 93.8  PLT 152 144* 148*    CBG: Recent Labs  Lab 07/26/23 1614 07/26/23 1922 07/26/23 2101 07/27/23 0554 07/27/23 1153  GLUCAP 63* 159* 122* 89 146*     IMAGING STUDIES DG Chest 2 View Result Date: 07/27/2023 CLINICAL DATA:  Status  post pacemaker placement EXAM: CHEST - 2 VIEW COMPARISON:  07/24/2023 FINDINGS: Cardiac shadow is within normal limits. Lungs are well aerated bilaterally. No focal infiltrate or effusion is seen. No bony abnormality is noted. New pacemaker is noted on the left. No evidence of pneumothorax is seen. IMPRESSION: No pneumothorax following pacemaker placement. Electronically Signed   By: Alcide Clever M.D.   On: 07/27/2023 10:45   EP PPM/ICD IMPLANT Result Date: 07/26/2023 SURGEON:  Loman Brooklyn, MD   PREPROCEDURE DIAGNOSIS:  second degree AV block, syncope   POSTPROCEDURE DIAGNOSIS:  second degree AV block, syncope    PROCEDURES:  1. Pacemaker implantation.   INTRODUCTION:  Haley Mayer is a 89 y.o. female with a history of bradycardia who presents today for pacemaker implantation.  The patient reports intermittent episodes of dizziness over the past few months.  No reversible causes have been identified.  The patient therefore presents today for pacemaker implantation.   DESCRIPTION OF PROCEDURE:  Informed written consent was obtained, and  the patient was brought to the electrophysiology lab in a fasting state.  The patient required no sedation for the procedure today.  The patients left chest was prepped and draped in the usual sterile fashion by the EP lab staff. The skin overlying the left deltopectoral region was infiltrated with lidocaine for local analgesia.  A 4-cm incision was made over the left deltopectoral region.  A left subcutaneous pacemaker pocket was fashioned using a combination of sharp and blunt dissection. Electrocautery was required to assure hemostasis.  RA/RV Lead Placement: The left axillary vein was therefore cannulated.  Through the left axillary vein, a Abbott Ultipace 1231-52  (serial number  [id number]) right atrial lead and an Abbott Ultipace 1231-65 (serial number  [id number]) right ventricular lead were advanced with fluoroscopic visualization into the right atrial appendage and left  bundle area positions respectively.  Initial atrial lead P- waves measured 4.7 mV with impedance of 450 ohms and a threshold of 0.75 V at 0.5 msec.  Right ventricular lead R-waves measured >12 mV with an impedance of 630 ohms and a threshold of 1 V at 0.5 msec.  Both leads were secured to the pectoralis fascia using #2-0 silk over the suture sleeves. Device Placement:  The leads were then connected to an Abbott Assurity [id number]  (serial number  [id number] ) pacemaker.  The pocket was irrigated with copious gentamicin solution.  The pacemaker was then placed into the pocket.  The pocket was then closed in 3 layers with 2.0 and 3.0 V-Loc suture for the subcutaneous layers and 3.0 Vicryl suture for the subcuticular layers.  Steri-  Strips and a sterile dressing were then applied. EBL<57ml.  There were no early apparent complications.   CONCLUSIONS:  1. Successful implantation of a Abbott Assurity [id number] dual-chamber pacemaker for symptomatic  bradycardia  2. No early apparent complications.   Will Elberta Fortis, MD 07/26/2023 3:09 PM  ECHOCARDIOGRAM COMPLETE Result Date: 07/26/2023    ECHOCARDIOGRAM REPORT   Patient Name:   Haley Mayer Date of Exam: 07/25/2023 Medical Rec #:  [id number]      Height:       62.0 in Accession #:    [id number]     Weight:       164.9 lb Date of Birth:  06/16/1929      BSA:          1.761 m Patient Age:    89 years       BP:           132/60 mmHg Patient Gender: F              HR:           52 bpm. Exam Location:  [facility] Procedure: 2D Echo, Cardiac Doppler and Color Doppler (Both Spectral and Color            Flow Doppler were utilized during procedure). Indications:     Syncope  History:         Patient has no prior history of Echocardiogram examinations.                  Arrythmias:LBBB and Bradycardia, Signs/Symptoms:Syncope; Risk                  Factors:Hypertension, Diabetes and Dyslipidemia. CKD.  Sonographer:     Mikki Harbor Referring Phys:  [id number] Emeline General Diagnosing Phys:  Yvonne Kendall MD IMPRESSIONS  1. Left ventricular ejection fraction, by estimation, is 55 to 60%. The left ventricle has normal function. The left ventricle has no regional wall motion abnormalities. There is mild left ventricular hypertrophy. Left ventricular diastolic parameters are indeterminate.  2. Right ventricular systolic function is normal. The right ventricular size is normal.  3. Left atrial size was moderately dilated.  4. The mitral valve is degenerative. Mild mitral valve regurgitation. No evidence of mitral stenosis.  5. The aortic valve is tricuspid. There is mild thickening of the aortic valve. Aortic valve regurgitation is trivial. Aortic valve sclerosis is present, with no evidence of aortic valve stenosis. FINDINGS  Left Ventricle: Left ventricular ejection fraction, by estimation, is 55 to 60%. The left ventricle has normal function. The left ventricle has no regional wall motion abnormalities. The left ventricular internal cavity size was normal in size. There is  mild left ventricular hypertrophy. Abnormal (paradoxical) septal motion, consistent with left bundle branch block. Left ventricular diastolic parameters are indeterminate. Right Ventricle: Pulmonary artery pressure is probably normal (RVSP 25 mmHg plus central venous/right atrial pressure). The right ventricular size is normal. No increase in right ventricular wall thickness. Right ventricular systolic function is normal. Left Atrium: Left atrial size was moderately dilated. Right Atrium: Right atrial size was normal in size. Pericardium: There is no evidence of pericardial effusion. Mitral Valve: The mitral valve is degenerative in appearance. There is mild thickening of the mitral valve leaflet(s). Mild mitral annular calcification. Mild mitral valve regurgitation. No evidence of mitral valve stenosis. MV peak gradient, 5.4 mmHg. The mean mitral valve gradient is 2.0 mmHg. Tricuspid Valve: The tricuspid valve is grossly normal.  Tricuspid valve regurgitation is mild. Aortic Valve: The aortic valve is tricuspid. There is mild thickening of the aortic valve. Aortic valve regurgitation is trivial. Aortic valve sclerosis is present, with no evidence of aortic valve stenosis. Aortic  valve mean gradient measures 3.0 mmHg. Aortic valve peak gradient measures 6.2 mmHg. Aortic valve area, by VTI measures 2.51 cm. Pulmonic Valve: The pulmonic valve was normal in structure. Pulmonic valve regurgitation is trivial. No evidence of pulmonic stenosis. Aorta: The aortic root is normal in size and structure. Pulmonary Artery: The pulmonary artery is of normal size. Venous: The inferior vena cava was not well visualized. IAS/Shunts: The interatrial septum was not well visualized.  LEFT VENTRICLE PLAX 2D LVIDd:         3.60 cm   Diastology LVIDs:         2.50 cm   LV e' medial:    5.44 cm/s LV PW:         1.10 cm   LV E/e' medial:  14.2 LV IVS:        1.30 cm   LV e' lateral:   9.14 cm/s LVOT diam:     2.00 cm   LV E/e' lateral: 8.4 LV SV:         88 LV SV Index:   50 LVOT Area:     3.14 cm  RIGHT VENTRICLE RV Basal diam:  3.10 cm RV Mid diam:    3.20 cm RV S prime:     10.40 cm/s TAPSE (M-mode): 2.5 cm LEFT ATRIUM             Index        RIGHT ATRIUM           Index LA diam:        4.00 cm 2.27 cm/m   RA Area:     13.70 cm LA Vol (A2C):   91.9 ml 52.18 ml/m  RA Volume:   36.50 ml  20.72 ml/m LA Vol (A4C):   70.1 ml 39.80 ml/m LA Biplane Vol: 83.7 ml 47.53 ml/m  AORTIC VALVE                    PULMONIC VALVE AV Area (Vmax):    2.15 cm     PV Vmax:       1.27 m/s AV Area (Vmean):   2.33 cm     PV Peak grad:  6.5 mmHg AV Area (VTI):     2.51 cm AV Vmax:           125.00 cm/s AV Vmean:          80.100 cm/s AV VTI:            0.352 m AV Peak Grad:      6.2 mmHg AV Mean Grad:      3.0 mmHg LVOT Vmax:         85.40 cm/s LVOT Vmean:        59.400 cm/s LVOT VTI:          0.281 m LVOT/AV VTI ratio: 0.80  AORTA Ao Root diam: 3.00 cm MITRAL VALVE                 TRICUSPID VALVE MV Area (PHT): 4.99 cm     TR Peak grad:   25.2 mmHg MV Area VTI:   2.94 cm     TR Vmax:        251.00 cm/s MV Peak grad:  5.4 mmHg MV Mean grad:  2.0 mmHg     SHUNTS MV Vmax:       1.16 m/s     Systemic VTI:  0.28 m MV Vmean:      66.6  cm/s    Systemic Diam: 2.00 cm MV Decel Time: 152 msec MV E velocity: 77.00 cm/s MV A velocity: 111.00 cm/s MV E/A ratio:  0.69 Yvonne Kendall MD Electronically signed by Yvonne Kendall MD Signature Date/Time: 07/26/2023/7:48:16 AM    Final    X-ray chest PA and lateral Result Date: 07/24/2023 CLINICAL DATA:  Syncope. EXAM: CHEST - 2 VIEW COMPARISON:  September 05, 2007. FINDINGS: The heart size and mediastinal contours are within normal limits. Hypoinflation of the lungs is noted. Both lungs are clear. The visualized skeletal structures are unremarkable. IMPRESSION: No active cardiopulmonary disease. Electronically Signed   By: Lupita Raider M.D.   On: 07/24/2023 16:52   CT Head Wo Contrast Result Date: 07/24/2023 CLINICAL DATA:  Syncope. EXAM: CT HEAD WITHOUT CONTRAST TECHNIQUE: Contiguous axial images were obtained from the base of the skull through the vertex without intravenous contrast. RADIATION DOSE REDUCTION: This exam was performed according to the departmental dose-optimization program which includes automated exposure control, adjustment of the mA and/or kV according to patient size and/or use of iterative reconstruction technique. COMPARISON:  11/18/2021 FINDINGS: Brain: No evidence of intracranial hemorrhage, acute infarction, hydrocephalus, extra-axial collection, or mass lesion/mass effect. Mild chronic small vessel disease, without significant change. Vascular:  No hyperdense vessel or other acute findings. Skull: No evidence of fracture or other significant bone abnormality. Sinuses/Orbits:  No acute findings. Other: None. IMPRESSION: No acute intracranial abnormality. Mild chronic small vessel disease. Electronically Signed   By: Danae Orleans M.D.   On: 07/24/2023 15:19    DISCHARGE EXAMINATION: Vitals:   07/26/23 2326 07/27/23 0410 07/27/23 0752 07/27/23 1200  BP: (!) 127/59 (!) 136/50 (!) 140/72 (!) 124/55  Pulse: 68 70 70 67  Resp: 17 18 18 18   Temp: 98.7 F (37.1 C) 98.7 F (37.1 C) 98.7 F (37.1 C) 98.7 F (37.1 C)  TempSrc: Oral Oral Oral Oral  SpO2: 99% 100% 96% 100%  Weight:  75 kg    Height:       General appearance: alert, cooperative, appears stated age, and no distress Resp: clear to auscultation bilaterally Cardio: regular rate and rhythm, S1, S2 normal, no murmur, click, rub or gallop GI: soft, non-tender; bowel sounds normal; no masses,  no organomegaly  DISPOSITION: Home  Discharge Instructions     Call MD for:  difficulty breathing, headache or visual disturbances   Complete by: As directed    Call MD for:  extreme fatigue   Complete by: As directed    Call MD for:  persistant dizziness or light-headedness   Complete by: As directed    Call MD for:  persistant nausea and vomiting   Complete by: As directed    Call MD for:  severe uncontrolled pain   Complete by: As directed    Call MD for:  temperature >100.4   Complete by: As directed    Diet - low sodium heart healthy   Complete by: As directed    Discharge instructions   Complete by: As directed    Please follow instructions provided by cardiology.  Take your medications as prescribed.  You were cared for by a hospitalist during your hospital stay. If you have any questions about your discharge medications or the care you received while you were in the hospital after you are discharged, you can call the unit and asked to speak with the hospitalist on call if the hospitalist that took care of you is not available. Once you are discharged,  your primary care physician will handle any further medical issues. Please note that NO REFILLS for any discharge medications will be authorized once you are discharged, as it is imperative that you  return to your primary care physician (or establish a relationship with a primary care physician if you do not have one) for your aftercare needs so that they can reassess your need for medications and monitor your lab values. If you do not have a primary care physician, you can call [phone] for a physician referral.   Increase activity slowly   Complete by: As directed          Allergies as of 07/27/2023       Reactions   Aspirin Nausea And Vomiting   REGULAR STRENGTH= Also upset stomach    Enalapril Maleate Other (See Comments)   angioedema   Iodinated Contrast Media Other (See Comments)   Vomiting and hives   Iohexol Nausea And Vomiting   Loperamide Hives   Other Other (See Comments)   Tomatoes--acid   Primidone Other (See Comments)   unknown unknown   Sulfa Antibiotics Other (See Comments)   Vomiting and hives   Vasotec [enalapril]    unknown        Medication List     STOP taking these medications    docusate sodium 100 MG capsule Commonly known as: COLACE       TAKE these medications    acetaminophen 500 MG tablet Commonly known as: TYLENOL Take 500 mg by mouth every 6 (six) hours as needed.   amLODipine 10 MG tablet Commonly known as: NORVASC TAKE 1 TABLET BY MOUTH ONCE  DAILY   aspirin EC 81 MG tablet Take 1 tablet (81 mg total) by mouth 2 (two) times daily after a meal. What changed: when to take this   CALCIUM 1200 PO Take 1 tablet by mouth every morning.   canagliflozin 100 MG Tabs tablet Commonly known as: Invokana Take 1 tablet (100 mg total) by mouth every morning.   CENTRUM VITAMINTS PO Take 1 tablet by mouth daily.   diphenhydrAMINE 25 MG tablet Commonly known as: BENADRYL Take 50 mg by mouth every 6 (six) hours as needed for itching or allergies.   OneTouch Delica Lancets 33G Misc Apply topically.   OneTouch Ultra test strip Generic drug: glucose blood 1 each daily.   polyethylene glycol 17 g packet Commonly known as:  MIRALAX / GLYCOLAX Take 17 g by mouth daily.   simvastatin 10 MG tablet Commonly known as: ZOCOR TAKE 1 TABLET BY MOUTH EVERY  NIGHT AT BEDTIME   valsartan-hydrochlorothiazide 320-12.5 MG tablet Commonly known as: DIOVAN-HCT TAKE 1 TABLET BY MOUTH IN THE  MORNING          Follow-up Information     Sherron Monday, MD Follow up.   Specialty: Internal Medicine Why: Please follow up in a week. Contact information: [address] [place] [address] [phone]                 TOTAL DISCHARGE TIME: 35 minutes  Rakia Frayne Rito Ehrlich  [facility] Pager on [email or web address]  07/28/2023, 10:18 AM

## 2023-07-27 NOTE — TOC CM/SW Note (Signed)
 Transition of Care [facility]) - Inpatient Brief Assessment   Patient Details  Name: Haley Mayer MRN: [id number] Date of Birth: 1929-06-07  [facility] CM/SW Contact:    Leone Haven, RN Phone Number: 07/27/2023, 2:09 PM   Clinical Narrative: From home with daughter, has PCP and insurance on file, states has no HH services in place at this time, has two walkers and a cane  at home.  States family member will transport them home at [employer] and family is support system, states gets medications from [facility]  or meds are deliveed.  Pta self ambulatory.    Transition of Care Asessment: Insurance and Status: Insurance coverage has been reviewed Patient has primary care physician: Yes Home environment has been reviewed: lives with daughter and her husband Prior level of function:: ambulatory Prior/Current Home Services: Current home services (walkers and a cane but do not use) Social Drivers of Health Review: SDOH reviewed no interventions necessary Readmission risk has been reviewed: Yes Transition of care needs: no transition of care needs at this time

## 2023-07-27 NOTE — Evaluation (Signed)
 Occupational Therapy Evaluation Patient Details Name: Haley Mayer MRN: [id number] DOB: 09-Feb-1930 Today's Date: 07/27/2023   History of Present Illness   Pt is a 89 y.o. female presenting 07/25/23 s/p three syncopal episodes while sitting at church. Admitted second-degree AV block. S/p pacemaker placement 07/26/23. PMHx includes, HTN, HLD, CKD stage IIIb, IIDM, Parkinson's disease     Clinical Impressions Prior to this admission, patient living with her family, and walking in the house with a RW, and in the community with a rollator. Patient had assist for her ADLs and IADLs when needed. Currently patient at min A level for ADLs, as patient is left handed and required further education in order to maintain pacemaker precautions. Patient able to complete toilet transfer at min A level and able to get off commode without assist using grab bars and not utilizing LUE. Education provided with regard to dressing and other ADLs with current restrictions to both patient and family with no further education needed. Patient with no further OT needs; OT will sign off at this time.      If plan is discharge home, recommend the following:   A little help with walking and/or transfers;A little help with bathing/dressing/bathroom;Assistance with cooking/housework;Assist for transportation;Help with stairs or ramp for entrance     Functional Status Assessment   Patient has had a recent decline in their functional status and demonstrates the ability to make significant improvements in function in a reasonable and predictable amount of time.     Equipment Recommendations   None recommended by OT     Recommendations for Other Services         Precautions/Restrictions   Precautions Precautions: ICD/Pacemaker;Fall Recall of Precautions/Restrictions: Intact Precaution/Restrictions Comments: Pt's ICD was 07/26/23 Restrictions Weight Bearing Restrictions Per Provider Order: Yes LUE Weight  Bearing Per Provider Order: Partial weight bearing LUE Partial Weight Bearing Percentage or Pounds: 5     Mobility Bed Mobility Overal bed mobility: Needs Assistance             General bed mobility comments: up in recliner upon entry    Transfers Overall transfer level: Needs assistance Equipment used: Rolling walker (2 wheels), Rollator (4 wheels) Transfers: Sit to/from Stand Sit to Stand: Min assist           General transfer comment: Able to stand from recliner with supervision, min A to complete toilet transfer for descent, able to ascend with supervision and use of R grab bar      Balance Overall balance assessment: Needs assistance Sitting-balance support: Feet supported, No upper extremity supported Sitting balance-Leahy Scale: Normal Sitting balance - Comments: pt sits EOB and in recliner without LOB or directional lean. Maintains L UE precautions throughout   Standing balance support: During functional activity, Single extremity supported, Bilateral upper extremity supported Standing balance-Leahy Scale: Fair                             ADL either performed or assessed with clinical judgement   ADL Overall ADL's : Needs assistance/impaired Eating/Feeding: Set up;Sitting   Grooming: Set up;Sitting   Upper Body Bathing: Set up;Sitting   Lower Body Bathing: Moderate assistance;Sitting/lateral leans;Sit to/from stand Lower Body Bathing Details (indicate cue type and reason): patient has assist needed at home Upper Body Dressing : Set up;Sitting   Lower Body Dressing: Moderate assistance;Sitting/lateral leans;Sit to/from stand   Toilet Transfer: Minimal assistance;Ambulation;Rolling walker (2 wheels);Grab bars   Toileting- Clothing Manipulation  and Hygiene: Contact guard assist;Sit to/from stand;Sitting/lateral lean       Functional mobility during ADLs: Minimal assistance;Cueing for safety;Cueing for sequencing;Rolling walker (2  wheels) General ADL Comments: Prior to this admission, patient living with her family, and walking in the house with a RW, and in the community with a rollator. Patient had assist for her ADLs and IADLs when needed. Currently patient at min A level for ADLs, as patient is left handed and required further education in order to maintain pacemaker precautions. Patient able to complete toilet transfer at min A level and able to get off commode without assist using grab bars and not utilizing LUE. Education provided with regard to dressing and other ADLs with current restrictions to both patient and family with no further education needed. Patient with no further OT needs; OT will sign off at this time.     Vision Baseline Vision/History: 0 No visual deficits Ability to See in Adequate Light: 0 Adequate Patient Visual Report: No change from baseline Vision Assessment?: No apparent visual deficits     Perception Perception: Not tested       Praxis Praxis: Not tested       Pertinent Vitals/Pain Pain Assessment Pain Assessment: No/denies pain     Extremity/Trunk Assessment Upper Extremity Assessment Upper Extremity Assessment: Generalized weakness;LUE deficits/detail LUE Deficits / Details: pacemaker precautions for LUE, did not assess full scope due to precautions LUE Coordination: decreased fine motor;decreased gross motor   Lower Extremity Assessment Lower Extremity Assessment: Defer to PT evaluation   Cervical / Trunk Assessment Cervical / Trunk Assessment: Kyphotic   Communication Communication Communication: Impaired Factors Affecting Communication: Hearing impaired   Cognition Arousal: Alert Behavior During Therapy: WFL for tasks assessed/performed                                 Following commands: Intact       Cueing  General Comments   Cueing Techniques: Verbal cues;Visual cues      Exercises     Shoulder Instructions      Home Living  Family/patient expects to be discharged to:: Private residence Living Arrangements: Children (daughter and son-in-law) Available Help at Discharge: Family;Available 24 hours/day Type of Home: House Home Access: Stairs to enter [employer] of Steps: 3 Entrance Stairs-Rails: Left (Back door) Home Layout: One level     Bathroom Shower/Tub: Producer, television/film/video: Handicapped height Bathroom Accessibility: Yes   Home Equipment: Agricultural consultant (2 wheels);Rollator (4 wheels);Cane - single point;Cane - quad;Shower seat;Grab bars - tub/shower;Grab bars - toilet   Additional Comments: Pt's home has 2 STE in the front with rails on R & L and can reach both.      Prior Functioning/Environment Prior Level of Function : Needs assist;History of Falls (last six months) (2 falls, both when bending over to pick items off the floor.)       Physical Assist : Mobility (physical);ADLs (physical) Mobility (physical): Gait;Stairs ADLs (physical): IADLs Mobility Comments: mod I with rollator in community; RW at home ADLs Comments: modI for ADLs, family performs IADLs    OT Problem List: Decreased range of motion;Decreased safety awareness;Decreased knowledge of precautions;Decreased knowledge of use of DME or AE   OT Treatment/Interventions:        OT Goals(Current goals can be found in the care plan section)   Acute Rehab OT Goals Patient Stated Goal: to go home OT Goal Formulation:  With patient Time For Goal Achievement: 08/10/23 Potential to Achieve Goals: Good   OT Frequency:       Co-evaluation              AM-PAC OT "6 Clicks" Daily Activity     Outcome Measure Help from another person eating meals?: A Little Help from another person taking care of personal grooming?: A Little Help from another person toileting, which includes using toliet, bedpan, or urinal?: A Lot Help from another person bathing (including washing, rinsing, drying)?: A Little Help  from another person to put on and taking off regular upper body clothing?: A Little Help from another person to put on and taking off regular lower body clothing?: A Lot 6 Click Score: 16   End of Session Equipment Utilized During Treatment: Rolling walker (2 wheels) Nurse Communication: Mobility status  Activity Tolerance: Patient tolerated treatment well Patient left: in chair;with call bell/phone within reach;with chair alarm set;with family/visitor present  OT Visit Diagnosis: Unsteadiness on feet (R26.81);Muscle weakness (generalized) (M62.81)                Time: 5284-1324 OT Time Calculation (min): 19 min Charges:  OT General Charges $OT Visit: 1 Visit OT Evaluation $OT Eval Moderate Complexity: 1 Mod  Pollyann Glen E. Tyion Boylen, OTR/L Acute Rehabilitation Services [phone]   Cherlyn Cushing 07/27/2023, 2:54 PM

## 2023-07-27 NOTE — TOC Transition Note (Signed)
 Transition of Care [facility]) - Discharge Note   Patient Details  Name: Haley Mayer MRN: [id number] Date of Birth: 1929/05/25  Transition [facility] CM/SW Contact:  Leone Haven, RN Phone Number: 07/27/2023, 2:10 PM   Clinical Narrative:    For dc , daughter is at the bedside to transport home.         Patient Goals and CMS Choice            Discharge Placement                       Discharge Plan and Services Additional resources added to the After Visit Summary for                                       Social Drivers of Health (SDOH) Interventions SDOH Screenings   Food Insecurity: No Food Insecurity (07/25/2023)  Housing: Low Risk  (07/25/2023)  Transportation Needs: No Transportation Needs (07/25/2023)  Utilities: Not At Risk (07/25/2023)  Depression (PHQ2-9): Low Risk  (07/02/2022)  Social Connections: Moderately Integrated (07/25/2023)  Tobacco Use: Low Risk  (07/25/2023)     Readmission Risk Interventions    07/27/2023    2:08 PM  Readmission Risk Prevention Plan  Post Dischage Appt Complete  Medication Screening Complete  Transportation Screening Complete

## 2023-08-03 ENCOUNTER — Telehealth: Payer: Self-pay

## 2023-08-03 NOTE — Telephone Encounter (Signed)
 I called the pt to get a transmission but she was napping and her daughter was not home. I was advised to call back later.

## 2023-08-04 ENCOUNTER — Ambulatory Visit: Admitting: Family

## 2023-08-04 ENCOUNTER — Encounter: Payer: Self-pay | Admitting: Internal Medicine

## 2023-08-04 VITALS — BP 134/70 | HR 70 | Ht 62.0 in | Wt 167.6 lb

## 2023-08-04 DIAGNOSIS — Z95811 Presence of heart assist device: Secondary | ICD-10-CM | POA: Insufficient documentation

## 2023-08-04 DIAGNOSIS — I10 Essential (primary) hypertension: Secondary | ICD-10-CM

## 2023-08-04 DIAGNOSIS — K644 Residual hemorrhoidal skin tags: Secondary | ICD-10-CM | POA: Diagnosis not present

## 2023-08-04 MED ORDER — HYDROCORTISONE ACETATE 25 MG RE SUPP: 25.0000 mg | RECTAL | 0 refills | Status: AC

## 2023-08-04 NOTE — Progress Notes (Signed)
 Acute Office Visit  Subjective:     Patient ID: Haley Mayer, female    DOB: 02-05-1930, 89 y.o.   MRN: [id number]  Patient is in today for  Chief Complaint  Patient presents with   Acute Visit    Pain in buttocks    Patient presents today for rectal pain.  She has been having this pain for a few months, but only mentioned in the last few days.  She says it doesn't seem to be any worse with any particular activity, including BMs.  She does take Miralax every day to help make sure that her       Review of Systems  Gastrointestinal:        Rectal pain  All other systems reviewed and are negative.       Objective:    BP 134/70   Pulse 70   Ht 5\' 2"  (1.575 m)   Wt 167 lb 9.6 oz (76 kg)   SpO2 99%   BMI 30.65 kg/m   Physical Exam Vitals and nursing note reviewed.  Constitutional:      Appearance: Normal appearance.  HENT:     Head: Normocephalic.  Eyes:     Extraocular Movements: Extraocular movements intact.     Conjunctiva/sclera: Conjunctivae normal.     Pupils: Pupils are equal, round, and reactive to light.  Cardiovascular:     Rate and Rhythm: Normal rate.  Pulmonary:     Effort: Pulmonary effort is normal.  Genitourinary:    Rectum: External hemorrhoid present. No anal fissure.  Neurological:     General: No focal deficit present.     Mental Status: She is alert and oriented to person, place, and time. Mental status is at baseline.  Psychiatric:        Mood and Affect: Mood normal.        Behavior: Behavior normal.        Thought Content: Thought content normal.        Judgment: Judgment normal.     No results found for any visits on 08/04/23.  Recent Results (from the past 2160 hours)  POCT CBG (Fasting - Glucose)     Status: Abnormal   Collection Time: 07/22/23 11:35 AM  Result Value Ref Range   Glucose Fasting, POC 109 (A) 70 - 99 mg/dL  CBC     Status: Abnormal   Collection Time: 07/24/23 11:28 AM  Result Value Ref Range   WBC 2.8  (L) 4.0 - 10.5 K/uL   RBC 4.15 3.87 - 5.11 MIL/uL   Hemoglobin 13.4 12.0 - 15.0 g/dL   HCT 08.6 57.8 - 46.9 %   MCV 96.1 80.0 - 100.0 fL   MCH 32.3 26.0 - 34.0 pg   MCHC 33.6 30.0 - 36.0 g/dL   RDW 62.9 52.8 - 41.3 %   Platelets 152 150 - 400 K/uL   nRBC 0.0 0.0 - 0.2 %    Comment: Performed at [facility], [address] [place], [place] [address]  Basic metabolic panel     Status: Abnormal   Collection Time: 07/24/23 11:28 AM  Result Value Ref Range   Sodium 132 (L) 135 - 145 mmol/L   Potassium 5.4 (H) 3.5 - 5.1 mmol/L   Chloride 100 98 - 111 mmol/L   CO2 24 22 - 32 mmol/L   Glucose, Bld 115 (H) 70 - 99 mg/dL    Comment: Glucose reference range applies only to samples taken after fasting for  at least 8 hours.   BUN 32 (H) 8 - 23 mg/dL   Creatinine, Ser 4.09 (H) 0.44 - 1.00 mg/dL   Calcium 9.5 8.9 - 81.1 mg/dL   GFR, Estimated 34 (L) >60 mL/min    Comment: (NOTE) Calculated using the CKD-EPI Creatinine Equation (2021)    Anion gap 8 5 - 15    Comment: Performed at [facility], [address] [place], [place] [address]  Troponin I (High Sensitivity)     Status: None   Collection Time: 07/24/23 11:28 AM  Result Value Ref Range   Troponin I (High Sensitivity) 14 <18 ng/L    Comment: (NOTE) Elevated high sensitivity troponin I (hsTnI) values and significant  changes across serial measurements may suggest ACS but many other  chronic and acute conditions are known to elevate hsTnI results.  Refer to the "Links" section for chest pain algorithms and additional  guidance. Performed at [facility], [address] [place], [place] [address]   Magnesium     Status: None   Collection Time: 07/24/23 11:28 AM  Result Value Ref Range   Magnesium 2.3 1.7 - 2.4 mg/dL    Comment: Performed at [facility], [address] [place], [place] [address]  Resp panel by RT-PCR (RSV, Flu A&B, Covid) Anterior Nasal Swab     Status: None   Collection  Time: 07/24/23  1:43 PM   Specimen: Anterior Nasal Swab  Result Value Ref Range   SARS Coronavirus 2 by RT PCR NEGATIVE NEGATIVE    Comment: (NOTE) SARS-CoV-2 target nucleic acids are NOT DETECTED.  The SARS-CoV-2 RNA is generally detectable in upper respiratory specimens during the acute phase of infection. The lowest concentration of SARS-CoV-2 viral copies this assay can detect is 138 copies/mL. A negative result does not preclude SARS-Cov-2 infection and should not be used as the sole basis for treatment or other patient management decisions. A negative result may occur with  improper specimen collection/handling, submission of specimen other than nasopharyngeal swab, presence of viral mutation(s) within the areas targeted by this assay, and inadequate number of viral copies(<138 copies/mL). A negative result must be combined with clinical observations, patient history, and epidemiological information. The expected result is Negative.  Fact Sheet for Patients:  BloggerCourse.com  Fact Sheet for Healthcare Providers:  SeriousBroker.it  This test is no t yet approved or cleared by the [place] FDA and  has been authorized for detection and/or diagnosis of SARS-CoV-2 by FDA under an Emergency Use Authorization (EUA). This EUA will remain  in effect (meaning this test can be used) for the duration of the COVID-19 declaration under Section 564(b)(1) of the Act, 21 U.S.C.section 360bbb-3(b)(1), unless the authorization is terminated  or revoked sooner.       Influenza A by PCR NEGATIVE NEGATIVE   Influenza B by PCR NEGATIVE NEGATIVE    Comment: (NOTE) The Xpert Xpress SARS-CoV-2/FLU/RSV plus assay is intended as an aid in the diagnosis of influenza from Nasopharyngeal swab specimens and should not be used as a sole basis for treatment. Nasal washings and aspirates are unacceptable for Xpert Xpress  SARS-CoV-2/FLU/RSV testing.  Fact Sheet for Patients: BloggerCourse.com  Fact Sheet for Healthcare Providers: SeriousBroker.it  This test is not yet approved or cleared by the [place] FDA and has been authorized for detection and/or diagnosis of SARS-CoV-2 by FDA under an Emergency Use Authorization (EUA). This EUA will remain in effect (meaning this test can be used) for the duration of  the COVID-19 declaration under Section 564(b)(1) of the Act, 21 U.S.C. section 360bbb-3(b)(1), unless the authorization is terminated or revoked.     Resp Syncytial Virus by PCR NEGATIVE NEGATIVE    Comment: (NOTE) Fact Sheet for Patients: BloggerCourse.com  Fact Sheet for Healthcare Providers: SeriousBroker.it  This test is not yet approved or cleared by the [place] FDA and has been authorized for detection and/or diagnosis of SARS-CoV-2 by FDA under an Emergency Use Authorization (EUA). This EUA will remain in effect (meaning this test can be used) for the duration of the COVID-19 declaration under Section 564(b)(1) of the Act, 21 U.S.C. section 360bbb-3(b)(1), unless the authorization is terminated or revoked.  Performed at [facility], [address] [place], [place] [address]   Urinalysis, w/ Reflex to Culture (Infection Suspected) -Urine, Clean Catch     Status: Abnormal   Collection Time: 07/24/23  1:43 PM  Result Value Ref Range   Specimen Source URINE, CLEAN CATCH    Color, Urine YELLOW (A) YELLOW   APPearance CLEAR (A) CLEAR   Specific Gravity, Urine 1.010 1.005 - 1.030   pH 6.0 5.0 - 8.0   Glucose, UA >=500 (A) NEGATIVE mg/dL   Hgb urine dipstick NEGATIVE NEGATIVE   Bilirubin Urine NEGATIVE NEGATIVE   Ketones, ur NEGATIVE NEGATIVE mg/dL   Protein, ur NEGATIVE NEGATIVE mg/dL   Nitrite NEGATIVE NEGATIVE   Leukocytes,Ua NEGATIVE NEGATIVE   RBC / HPF 0-5  0 - 5 RBC/hpf   WBC, UA 0 0 - 5 WBC/hpf    Comment:        Reflex urine culture not performed if WBC <=10, OR if Squamous epithelial cells >5. If Squamous epithelial cells >5 suggest recollection.    Bacteria, UA RARE (A) NONE SEEN   Squamous Epithelial / HPF 0-5 0 - 5 /HPF    Comment: Performed at [facility], [address] [place], [place] [address]  TSH     Status: None   Collection Time: 07/24/23  2:00 PM  Result Value Ref Range   TSH 3.826 0.350 - 4.500 uIU/mL    Comment: Performed by a 3rd Generation assay with a functional sensitivity of <=0.01 uIU/mL. Performed at [facility], [address] [place], [place] [address]   Phosphorus     Status: None   Collection Time: 07/24/23  2:00 PM  Result Value Ref Range   Phosphorus 3.8 2.5 - 4.6 mg/dL    Comment: Performed at [facility], [address] [place], [place] [address]  Troponin I (High Sensitivity)     Status: None   Collection Time: 07/24/23  2:43 PM  Result Value Ref Range   Troponin I (High Sensitivity) 15 <18 ng/L    Comment: (NOTE) Elevated high sensitivity troponin I (hsTnI) values and significant  changes across serial measurements may suggest ACS but many other  chronic and acute conditions are known to elevate hsTnI results.  Refer to the "Links" section for chest pain algorithms and additional  guidance. Performed at [facility], [address] [place], [place] [address]   CBG monitoring, ED     Status: Abnormal   Collection Time: 07/24/23  4:46 PM  Result Value Ref Range   Glucose-Capillary 110 (H) 70 - 99 mg/dL    Comment: Glucose reference range applies only to samples taken after fasting for at least 8 hours.  Cortisol     Status: None   Collection Time: 07/24/23  4:47 PM  Result Value  Ref Range   Cortisol, Plasma 10.2 ug/dL    Comment: (NOTE) AM    6.7 - 22.6 ug/dL PM   <16.1       ug/dL Performed at [facility], [address],  [place], [place] [address]   CBG monitoring, ED     Status: Abnormal   Collection Time: 07/24/23 10:07 PM  Result Value Ref Range   Glucose-Capillary 106 (H) 70 - 99 mg/dL    Comment: Glucose reference range applies only to samples taken after fasting for at least 8 hours.  CBG monitoring, ED     Status: None   Collection Time: 07/25/23  4:45 AM  Result Value Ref Range   Glucose-Capillary 74 70 - 99 mg/dL    Comment: Glucose reference range applies only to samples taken after fasting for at least 8 hours.  CBC with Differential/Platelet     Status: Abnormal   Collection Time: 07/25/23  8:22 AM  Result Value Ref Range   WBC 2.9 (L) 4.0 - 10.5 K/uL   RBC 4.08 3.87 - 5.11 MIL/uL   Hemoglobin 13.2 12.0 - 15.0 g/dL   HCT 54.0 98.1 - 19.1 %   MCV 95.1 80.0 - 100.0 fL   MCH 32.4 26.0 - 34.0 pg   MCHC 34.0 30.0 - 36.0 g/dL   RDW 47.8 29.5 - 62.1 %   Platelets 144 (L) 150 - 400 K/uL   nRBC 0.0 0.0 - 0.2 %   Neutrophils Relative % 43 %   Neutro Abs 1.3 (L) 1.7 - 7.7 K/uL   Lymphocytes Relative 40 %   Lymphs Abs 1.2 0.7 - 4.0 K/uL   Monocytes Relative 15 %   Monocytes Absolute 0.4 0.1 - 1.0 K/uL   Eosinophils Relative 2 %   Eosinophils Absolute 0.1 0.0 - 0.5 K/uL   Basophils Relative 0 %   Basophils Absolute 0.0 0.0 - 0.1 K/uL   Immature Granulocytes 0 %   Abs Immature Granulocytes 0.00 0.00 - 0.07 K/uL    Comment: Performed at [facility], [address], [place], [place] [address]  Basic metabolic panel     Status: Abnormal   Collection Time: 07/25/23  8:22 AM  Result Value Ref Range   Sodium 135 135 - 145 mmol/L   Potassium 4.0 3.5 - 5.1 mmol/L   Chloride 102 98 - 111 mmol/L   CO2 25 22 - 32 mmol/L   Glucose, Bld 92 70 - 99 mg/dL    Comment: Glucose reference range applies only to samples taken after fasting for at least 8 hours.   BUN 28 (H) 8 - 23 mg/dL   Creatinine, Ser 7.84 (H) 0.44 - 1.00 mg/dL   Calcium 8.9 8.9 - 69.6 mg/dL   GFR, Estimated 41 (L) >60 mL/min     Comment: (NOTE) Calculated using the CKD-EPI Creatinine Equation (2021)    Anion gap 8 5 - 15    Comment: Performed at [facility], [address] [place], [place] [address]  CBG monitoring, ED     Status: Abnormal   Collection Time: 07/25/23 11:44 AM  Result Value Ref Range   Glucose-Capillary 66 (L) 70 - 99 mg/dL    Comment: Glucose reference range applies only to samples taken after fasting for at least 8 hours.  CBG monitoring, ED     Status: Abnormal   Collection Time: 07/25/23 12:27 PM  Result Value Ref Range   Glucose-Capillary 117 (H) 70 - 99 mg/dL  Comment: Glucose reference range applies only to samples taken after fasting for at least 8 hours.  ECHOCARDIOGRAM COMPLETE     Status: None   Collection Time: 07/25/23  2:10 PM  Result Value Ref Range   Weight 2,638.47 oz   Height 62 in   BP 152/64 mmHg   Ao pk vel 1.25 m/s   AV Area VTI 2.51 cm2   AR max vel 2.15 cm2   AV Mean grad 3.0 mmHg   AV Peak grad 6.3 mmHg   S' Lateral 2.50 cm   AV Area mean vel 2.33 cm2   Area-P 1/2 4.99 cm2   MV VTI 2.94 cm2   Est EF 55 - 60%   Vitamin B12     Status: None   Collection Time: 07/25/23  5:50 PM  Result Value Ref Range   Vitamin B-12 795 180 - 914 pg/mL    Comment: (NOTE) This assay is not validated for testing neonatal or myeloproliferative syndrome specimens for Vitamin B12 levels. Performed at [facility], [address] [place], [place] [address]   Glucose, capillary     Status: Abnormal   Collection Time: 07/25/23  9:49 PM  Result Value Ref Range   Glucose-Capillary 137 (H) 70 - 99 mg/dL    Comment: Glucose reference range applies only to samples taken after fasting for at least 8 hours.   Comment 1 Notify RN    Comment 2 Document in Chart   Comprehensive metabolic panel     Status: Abnormal   Collection Time: 07/26/23  3:20 AM  Result Value Ref Range   Sodium 132 (L) 135 - 145 mmol/L   Potassium 4.0 3.5 - 5.1 mmol/L   Chloride 101 98 - 111  mmol/L   CO2 23 22 - 32 mmol/L   Glucose, Bld 98 70 - 99 mg/dL    Comment: Glucose reference range applies only to samples taken after fasting for at least 8 hours.   BUN 27 (H) 8 - 23 mg/dL   Creatinine, Ser 1.91 (H) 0.44 - 1.00 mg/dL   Calcium 8.8 (L) 8.9 - 10.3 mg/dL   Total Protein 6.7 6.5 - 8.1 g/dL   Albumin 2.8 (L) 3.5 - 5.0 g/dL   AST 17 15 - 41 U/L   ALT 14 0 - 44 U/L   Alkaline Phosphatase 48 38 - 126 U/L   Total Bilirubin 0.7 0.0 - 1.2 mg/dL   GFR, Estimated 31 (L) >60 mL/min    Comment: (NOTE) Calculated using the CKD-EPI Creatinine Equation (2021)    Anion gap 8 5 - 15    Comment: Performed at [facility], [address] [place], [place] [address]  CBC     Status: Abnormal   Collection Time: 07/26/23  3:20 AM  Result Value Ref Range   WBC 3.3 (L) 4.0 - 10.5 K/uL   RBC 3.86 (L) 3.87 - 5.11 MIL/uL   Hemoglobin 12.3 12.0 - 15.0 g/dL   HCT 56.2 13.0 - 86.5 %   MCV 93.8 80.0 - 100.0 fL   MCH 31.9 26.0 - 34.0 pg   MCHC 34.0 30.0 - 36.0 g/dL   RDW 78.4 69.6 - 29.5 %   Platelets 148 (L) 150 - 400 K/uL   nRBC 0.0 0.0 - 0.2 %    Comment: Performed at [facility], [address] [place], [place] [address]  Protime-INR     Status: None   Collection Time: 07/26/23  3:20  AM  Result Value Ref Range   Prothrombin Time 13.6 11.4 - 15.2 seconds   INR 1.0 0.8 - 1.2    Comment: (NOTE) INR goal varies based on device and disease states. Performed at [facility], [address] [place], [place] [address]   Glucose, capillary     Status: None   Collection Time: 07/26/23  5:46 AM  Result Value Ref Range   Glucose-Capillary 76 70 - 99 mg/dL    Comment: Glucose reference range applies only to samples taken after fasting for at least 8 hours.  Surgical PCR screen     Status: Abnormal   Collection Time: 07/26/23  7:27 AM   Specimen: Nasal Mucosa; Nasal Swab  Result Value Ref Range   MRSA, PCR NEGATIVE NEGATIVE   Staphylococcus aureus POSITIVE (A) NEGATIVE     Comment: (NOTE) The Xpert SA Assay (FDA approved for NASAL specimens in patients 53 years of age and older), is one component of a comprehensive surveillance program. It is not intended to diagnose infection nor to guide or monitor treatment. Performed at [facility], [address] [place], [place] [address]   Folate     Status: None   Collection Time: 07/26/23  8:52 AM  Result Value Ref Range   Folate >40.0 >5.9 ng/mL    Comment: Performed at [facility], [address], [place], [place] [address]  Glucose, capillary     Status: None   Collection Time: 07/26/23 10:57 AM  Result Value Ref Range   Glucose-Capillary 94 70 - 99 mg/dL    Comment: Glucose reference range applies only to samples taken after fasting for at least 8 hours.  Glucose, capillary     Status: Abnormal   Collection Time: 07/26/23  4:14 PM  Result Value Ref Range   Glucose-Capillary 63 (L) 70 - 99 mg/dL    Comment: Glucose reference range applies only to samples taken after fasting for at least 8 hours.  Glucose, capillary     Status: Abnormal   Collection Time: 07/26/23  7:22 PM  Result Value Ref Range   Glucose-Capillary 159 (H) 70 - 99 mg/dL    Comment: Glucose reference range applies only to samples taken after fasting for at least 8 hours.  Glucose, capillary     Status: Abnormal   Collection Time: 07/26/23  9:01 PM  Result Value Ref Range   Glucose-Capillary 122 (H) 70 - 99 mg/dL    Comment: Glucose reference range applies only to samples taken after fasting for at least 8 hours.   Comment 1 Notify RN    Comment 2 Document in Chart   Glucose, capillary     Status: None   Collection Time: 07/27/23  5:54 AM  Result Value Ref Range   Glucose-Capillary 89 70 - 99 mg/dL    Comment: Glucose reference range applies only to samples taken after fasting for at least 8 hours.   Comment 1 Notify RN    Comment 2 Document in Chart   Glucose, capillary     Status: Abnormal   Collection Time:  07/27/23 11:53 AM  Result Value Ref Range   Glucose-Capillary 146 (H) 70 - 99 mg/dL    Comment: Glucose reference range applies only to samples taken after fasting for at least 8 hours.    Allergies as of 08/04/2023       Reactions   Aspirin Nausea And Vomiting   REGULAR STRENGTH= Also upset stomach  Enalapril Maleate Other (See Comments)   angioedema   Iodinated Contrast Media Other (See Comments)   Vomiting and hives   Iohexol Nausea And Vomiting   Loperamide Hives   Other Other (See Comments)   Tomatoes--acid   Primidone Other (See Comments)   unknown unknown   Sulfa Antibiotics Other (See Comments)   Vomiting and hives   Vasotec [enalapril]    unknown        Medication List        Accurate as of August 04, 2023  4:51 PM. If you have any questions, ask your nurse or doctor.          acetaminophen 500 MG tablet Commonly known as: TYLENOL Take 500 mg by mouth every 6 (six) hours as needed.   amLODipine 10 MG tablet Commonly known as: NORVASC TAKE 1 TABLET BY MOUTH ONCE  DAILY   aspirin EC 81 MG tablet Take 1 tablet (81 mg total) by mouth 2 (two) times daily after a meal. What changed: when to take this   CALCIUM 1200 PO Take 1 tablet by mouth every morning.   canagliflozin 100 MG Tabs tablet Commonly known as: Invokana Take 1 tablet (100 mg total) by mouth every morning.   CENTRUM VITAMINTS PO Take 1 tablet by mouth daily.   diphenhydrAMINE 25 MG tablet Commonly known as: BENADRYL Take 50 mg by mouth every 6 (six) hours as needed for itching or allergies.   hydrocortisone 25 MG suppository Commonly known as: ANUSOL-HC Place 1 suppository (25 mg total) rectally every other day. Started by: Miki Kins   OneTouch Delica Lancets 33G Misc Apply topically.   OneTouch Ultra test strip Generic drug: glucose blood 1 each daily.   polyethylene glycol 17 g packet Commonly known as: MIRALAX / GLYCOLAX Take 17 g by mouth daily.   simvastatin  10 MG tablet Commonly known as: ZOCOR TAKE 1 TABLET BY MOUTH EVERY  NIGHT AT BEDTIME   valsartan-hydrochlorothiazide 320-12.5 MG tablet Commonly known as: DIOVAN-HCT TAKE 1 TABLET BY MOUTH IN THE  MORNING            Assessment & Plan:   Problem List Items Addressed This Visit   None Visit Diagnoses       External hemorrhoids    -  Primary   Sending RX for hydrocortisone suppositories.  Pt. to use every other day.        Return as previously scheduled.  Total time spent: 20 minutes  Miki Kins, FNP  08/04/2023   This document may have been prepared by [facility] Voice Recognition software and as such may include unintentional dictation errors.

## 2023-08-08 ENCOUNTER — Other Ambulatory Visit: Payer: Self-pay

## 2023-08-08 MED ORDER — VALSARTAN-HYDROCHLOROTHIAZIDE 320-12.5 MG PO TABS: 1.0000 | ORAL_TABLET | Freq: Every morning | ORAL | 3 refills | Status: DC

## 2023-08-10 ENCOUNTER — Other Ambulatory Visit: Payer: Self-pay

## 2023-08-10 ENCOUNTER — Ambulatory Visit: Attending: Cardiology | Admitting: Cardiology

## 2023-08-10 DIAGNOSIS — Z950 Presence of cardiac pacemaker: Secondary | ICD-10-CM | POA: Insufficient documentation

## 2023-08-10 MED ORDER — VALSARTAN-HYDROCHLOROTHIAZIDE 320-12.5 MG PO TABS: 1.0000 | ORAL_TABLET | Freq: Every morning | ORAL | 3 refills | Status: AC

## 2023-08-10 NOTE — Progress Notes (Deleted)

## 2023-08-25 ENCOUNTER — Ambulatory Visit (INDEPENDENT_AMBULATORY_CARE_PROVIDER_SITE_OTHER): Payer: Medicare Other | Admitting: Podiatry

## 2023-08-25 ENCOUNTER — Encounter: Payer: Self-pay | Admitting: Podiatry

## 2023-08-25 DIAGNOSIS — M79676 Pain in unspecified toe(s): Secondary | ICD-10-CM

## 2023-08-25 DIAGNOSIS — B351 Tinea unguium: Secondary | ICD-10-CM

## 2023-08-25 DIAGNOSIS — E0842 Diabetes mellitus due to underlying condition with diabetic polyneuropathy: Secondary | ICD-10-CM

## 2023-08-29 NOTE — Progress Notes (Signed)
Subjective:  Patient ID: Haley Mayer, female    DOB: Mar 14, 1930,  MRN: [id number]  89 y.o. female presents at risk foot care with history of diabetic neuropathy and painful, elongated thickened toenails x 10 which are symptomatic when wearing enclosed shoe gear. This interferes with his/her daily activities. Chief Complaint  Patient presents with   Diabetes    "Check my feet and trim my toenails."  Haley Congress, FNP - 07/22/2023; A1c - ?   New problem(s): None   PCP is Haley Kins, FNP . Allergies  Allergen Reactions   Aspirin Nausea And Vomiting    REGULAR STRENGTH= Also upset stomach    Enalapril Maleate Other (See Comments)    angioedema   Iodinated Contrast Media Other (See Comments)    Vomiting and hives   Iohexol Nausea And Vomiting   Loperamide Hives   Other Other (See Comments)    Tomatoes--acid   Primidone Other (See Comments)    unknown unknown   Sulfa Antibiotics Other (See Comments)    Vomiting and hives   Vasotec [Enalapril]     unknown    Review of Systems: Negative except as noted in the HPI.   Objective:  Haley Mayer is a pleasant 89 y.o. female WD, WN in NAD. AAO x 3.  Vascular Examination: CFT less than 3 seconds. DP pulses palpable b/l. PT pulses nonpalpable b/l. Digital hair absent. Skin temperature gradient warm to warn b/l. No ischemia or gangrene. No cyanosis or clubbing noted b/l.    Neurological Examination: Protective sensation diminished with 10g monofilament b/l. Vibratory sensation diminished b/l.  Dermatological Examination: Pedal skin thin, shiny and atrophic b/l. No open wounds. No interdigital macerations.   Toenails 1-5 b/l thick, discolored, elongated with subungual debris and pain on dorsal palpation.   No corns, calluses nor porokeratotic lesions noted.  Musculoskeletal Examination: Muscle strength 5/5 to all lower extremity muscle groups bilaterally. No pain, crepitus or joint limitation noted with ROM bilateral  LE. No gross bony deformities bilaterally.  Radiographs: None  Last A1c:      Latest Ref Rng & Units 04/20/2023    9:39 AM 01/20/2023    9:40 AM  Hemoglobin A1C  Hemoglobin-A1c 4.8 - 5.6 % 6.0  5.9     Assessment:   1. Pain due to onychomycosis of toenail   2. Diabetes mellitus due to underlying condition with diabetic polyneuropathy, unspecified whether long term insulin use (HCC)    Plan:  Consent given for treatment. Diabetic foot examination performed.. All patient's and/or POA's questions/concerns addressed on today's visit. Mycotic toenails 1-5 debrided in length and girth without incident. Continue foot and shoe inspections daily. Monitor blood glucose per PCP/Endocrinologist's recommendations.Continue soft, supportive shoe gear daily. Report any pedal injuries to medical professional. Call office if there are any quesitons/concerns. -Patient/POA to call should there be question/concern in the interim.  Return in about 3 months (around 11/24/2023).  Freddie Breech, DPM      [place] LOCATION: [address] [place] [address]                   Office [phone]   [place] LOCATION: [address] [place], [place] [address] Office [phone])  [phone] 

## 2023-09-12 ENCOUNTER — Ambulatory Visit (INDEPENDENT_AMBULATORY_CARE_PROVIDER_SITE_OTHER)

## 2023-09-12 DIAGNOSIS — I441 Atrioventricular block, second degree: Secondary | ICD-10-CM

## 2023-09-12 LAB — CUP PACEART REMOTE DEVICE CHECK
Battery Remaining Longevity: 124 mo
Battery Remaining Percentage: 95.5 %
Battery Voltage: 3.02 V
Brady Statistic AP VP Percent: 74 %
Brady Statistic AP VS Percent: 1 %
Brady Statistic AS VP Percent: 26 %
Brady Statistic AS VS Percent: 1 %
Brady Statistic RA Percent Paced: 73 %
Brady Statistic RV Percent Paced: 99 %
Date Time Interrogation Session: 20250428020024
Implantable Lead Connection Status: 753985
Implantable Lead Connection Status: 753985
Implantable Lead Implant Date: 20250311
Implantable Lead Implant Date: 20250311
Implantable Lead Location: 753859
Implantable Lead Location: 753860
Implantable Pulse Generator Implant Date: 20250311
Lead Channel Impedance Value: 460 Ohm
Lead Channel Impedance Value: 550 Ohm
Lead Channel Pacing Threshold Amplitude: 0.625 V
Lead Channel Pacing Threshold Amplitude: 0.75 V
Lead Channel Pacing Threshold Pulse Width: 0.5 ms
Lead Channel Pacing Threshold Pulse Width: 0.5 ms
Lead Channel Sensing Intrinsic Amplitude: 12 mV
Lead Channel Sensing Intrinsic Amplitude: 5 mV
Lead Channel Setting Pacing Amplitude: 1 V
Lead Channel Setting Pacing Amplitude: 1.625
Lead Channel Setting Pacing Pulse Width: 0.5 ms
Lead Channel Setting Sensing Sensitivity: 2 mV
Pulse Gen Model: 2272
Pulse Gen Serial Number: 8254475

## 2023-10-03 ENCOUNTER — Other Ambulatory Visit: Payer: Self-pay | Admitting: Internal Medicine

## 2023-10-25 ENCOUNTER — Telehealth: Payer: Self-pay

## 2023-10-25 NOTE — Telephone Encounter (Signed)
 Pt's daughter called regarding pt's rx Freestyle Libre 2, she was told by the pharmacy that pt needs new rx for the device/sensors- they are no longer making the Morral 2? Please advise

## 2023-10-31 ENCOUNTER — Encounter: Payer: Self-pay | Admitting: Cardiology

## 2023-10-31 NOTE — Progress Notes (Deleted)
 Electrophysiology Clinic Note    Date:  10/31/2023  Patient ID:  Haley, Mayer 09-Jun-1929, MRN [id number] PCP:  Trenda Frisk, FNP  Cardiologist:  None Electrophysiologist: Boyce Byes, MD  ***refresh  Discussed the use of AI scribe software for clinical note transcription with the patient, who gave verbal consent to proceed.   Patient Profile    Chief Complaint: ***  History of Present Illness: Haley Mayer is a 89 y.o. female with PMH notable for syncope, LBBB, mobitz 2 s/p PPM, HTN, CKD, T2DM; seen today for Boyce Byes, MD for routine electrophysiology follow-up s/p Pacemaker implant.  She was seen 07/2023 after multiple syncopal episodes at church, found to be in [place] with intermittent 2:1 HB with LBBB.  She is s/p PPM implant.   On follow-up today, ***   Since last being seen in our clinic the patient reports doing ***.  she denies chest pain, palpitations, dyspnea, PND, orthopnea, nausea, vomiting, dizziness, syncope, edema, weight gain, or early satiety.       Arrhythmia/Device History Abbott dual chamber PPM, imp 07/2023; dx AV block     ROS:  Please see the history of present illness. All other systems are reviewed and otherwise negative.    Physical Exam    VS:  There were no vitals taken for this visit. BMI: There is no height or weight on file to calculate BMI.  Wt Readings from Last 3 Encounters:  08/04/23 167 lb 9.6 oz (76 kg)  07/27/23 165 lb 5.5 oz (75 kg)  07/24/23 164 lb 14.5 oz (74.8 kg)     GEN- The patient is well appearing, alert and oriented x 3 today.   Lungs- Clear to ausculation bilaterally, normal work of breathing.  Heart- {Blank single:19197::Regular,Irregularly irregular} rate and rhythm, no murmurs, rubs or gallops Extremities- {EDEMA LEVEL:28147::No} peripheral edema, warm, dry Skin-  *** device pocket well-healed, no tethering   Device interrogation done today and reviewed by myself:   Battery *** Lead thresholds, impedence, sensing stable *** *** episodes *** changes made today   Studies Reviewed   Previous EP, cardiology notes.    EKG is ordered. Personal review of EKG from today shows:  ***        TTE, 07/26/2023  1. Left ventricular ejection fraction, by estimation, is 55 to 60%. The left ventricle has normal function. The left ventricle has no regional wall motion abnormalities. There is mild left ventricular hypertrophy. Left ventricular diastolic parameters are indeterminate.   2. Right ventricular systolic function is normal. The right ventricular  size is normal.   3. Left atrial size was moderately dilated.   4. The mitral valve is degenerative. Mild mitral valve regurgitation. No evidence of mitral stenosis.   5. The aortic valve is tricuspid. There is mild thickening of the aortic valve. Aortic valve regurgitation is trivial. Aortic valve sclerosis is present, with no evidence of aortic valve stenosis.    Assessment and Plan     #) syncope #) mobitz 2 type 2 #) 2:1 HB #) PPM in situ    #) ***   {Are you ordering a CV Procedure (e.g. stress test, cath, DCCV, TEE, etc)?   Press F2        :[id number]}   Current medicines are reviewed at length with the patient today.   The patient {ACTIONS; HAS/DOES NOT HAVE:19233} concerns regarding her medicines.  The following changes were made today:  {NONE DEFAULTED:18576}  Labs/ tests ordered  today include: *** No orders of the defined types were placed in this encounter.    Disposition: Follow up with {EPMDS:28135::EP Team} or EP APP {EPFOLLOW UP:28173}   Signed, Adaline Holly, NP  10/31/23  7:00 PM  Electrophysiology [facility]

## 2023-11-01 ENCOUNTER — Ambulatory Visit: Attending: Cardiology | Admitting: Cardiology

## 2023-11-01 DIAGNOSIS — I441 Atrioventricular block, second degree: Secondary | ICD-10-CM

## 2023-11-01 DIAGNOSIS — R55 Syncope and collapse: Secondary | ICD-10-CM

## 2023-11-01 DIAGNOSIS — Z950 Presence of cardiac pacemaker: Secondary | ICD-10-CM

## 2023-11-01 NOTE — Progress Notes (Signed)
 Remote pacemaker transmission.

## 2023-11-03 ENCOUNTER — Other Ambulatory Visit: Payer: Self-pay | Admitting: Internal Medicine

## 2023-11-03 ENCOUNTER — Other Ambulatory Visit

## 2023-11-04 ENCOUNTER — Ambulatory Visit (INDEPENDENT_AMBULATORY_CARE_PROVIDER_SITE_OTHER): Admitting: Family

## 2023-11-04 ENCOUNTER — Encounter: Payer: Self-pay | Admitting: Family

## 2023-11-04 VITALS — BP 140/76 | HR 70 | Ht 62.0 in | Wt 171.6 lb

## 2023-11-04 DIAGNOSIS — Z95811 Presence of heart assist device: Secondary | ICD-10-CM

## 2023-11-04 DIAGNOSIS — E1165 Type 2 diabetes mellitus with hyperglycemia: Secondary | ICD-10-CM | POA: Diagnosis not present

## 2023-11-04 DIAGNOSIS — E119 Type 2 diabetes mellitus without complications: Secondary | ICD-10-CM

## 2023-11-04 DIAGNOSIS — N1832 Chronic kidney disease, stage 3b: Secondary | ICD-10-CM | POA: Diagnosis not present

## 2023-11-04 DIAGNOSIS — E1169 Type 2 diabetes mellitus with other specified complication: Secondary | ICD-10-CM | POA: Diagnosis not present

## 2023-11-04 DIAGNOSIS — E669 Obesity, unspecified: Secondary | ICD-10-CM

## 2023-11-04 DIAGNOSIS — I10 Essential (primary) hypertension: Secondary | ICD-10-CM

## 2023-11-04 LAB — CBC WITH DIFF/PLATELET
Basophils Absolute: 0 10*3/uL (ref 0.0–0.2)
Basos: 0 %
EOS (ABSOLUTE): 0.1 10*3/uL (ref 0.0–0.4)
Eos: 3 %
Hematocrit: 41 % (ref 34.0–46.6)
Hemoglobin: 12.9 g/dL (ref 11.1–15.9)
Immature Grans (Abs): 0 10*3/uL (ref 0.0–0.1)
Immature Granulocytes: 0 %
Lymphocytes Absolute: 1.9 10*3/uL (ref 0.7–3.1)
Lymphs: 54 %
MCH: 30.9 pg (ref 26.6–33.0)
MCHC: 31.5 g/dL (ref 31.5–35.7)
MCV: 98 fL — ABNORMAL HIGH (ref 79–97)
Monocytes Absolute: 0.4 10*3/uL (ref 0.1–0.9)
Monocytes: 10 %
Neutrophils Absolute: 1.2 10*3/uL — ABNORMAL LOW (ref 1.4–7.0)
Neutrophils: 33 %
Platelets: 168 10*3/uL (ref 150–450)
RBC: 4.18 x10E6/uL (ref 3.77–5.28)
RDW: 13.3 % (ref 11.7–15.4)
WBC: 3.6 10*3/uL (ref 3.4–10.8)

## 2023-11-04 LAB — COMPREHENSIVE METABOLIC PANEL WITH GFR
ALT: 9 IU/L (ref 0–32)
AST: 17 IU/L (ref 0–40)
Albumin: 4 g/dL (ref 3.6–4.6)
Alkaline Phosphatase: 69 IU/L (ref 44–121)
BUN/Creatinine Ratio: 19 (ref 12–28)
BUN: 31 mg/dL (ref 10–36)
Bilirubin Total: 0.5 mg/dL (ref 0.0–1.2)
CO2: 21 mmol/L (ref 20–29)
Calcium: 9.5 mg/dL (ref 8.7–10.3)
Chloride: 100 mmol/L (ref 96–106)
Creatinine, Ser: 1.66 mg/dL — ABNORMAL HIGH (ref 0.57–1.00)
Globulin, Total: 4 g/dL (ref 1.5–4.5)
Glucose: 104 mg/dL — ABNORMAL HIGH (ref 70–99)
Potassium: 4.3 mmol/L (ref 3.5–5.2)
Sodium: 136 mmol/L (ref 134–144)
Total Protein: 8 g/dL (ref 6.0–8.5)
eGFR: 29 mL/min/{1.73_m2} — ABNORMAL LOW (ref >59)

## 2023-11-04 LAB — LIPID PANEL
Chol/HDL Ratio: 2.5 ratio (ref 0.0–4.4)
Cholesterol, Total: 130 mg/dL (ref 100–199)
HDL: 52 mg/dL (ref >39)
LDL Chol Calc (NIH): 65 mg/dL (ref 0–99)
Triglycerides: 58 mg/dL (ref 0–149)
VLDL Cholesterol Cal: 13 mg/dL (ref 5–40)

## 2023-11-04 LAB — HEMOGLOBIN A1C
Est. average glucose Bld gHb Est-mCnc: 120 mg/dL
Hgb A1c MFr Bld: 5.8 % — ABNORMAL HIGH (ref 4.8–5.6)

## 2023-11-04 LAB — POCT CBG (FASTING - GLUCOSE)-MANUAL ENTRY: Glucose Fasting, POC: 147 mg/dL — AB (ref 70–99)

## 2023-11-04 NOTE — Telephone Encounter (Signed)
 Discuss at visit today

## 2023-11-04 NOTE — Progress Notes (Signed)
 Established Patient Office Visit  Subjective:  Patient ID: Haley Mayer, female    DOB: March 10, 1930  Age: 89 y.o. MRN: [id number]  Chief Complaint  Patient presents with   Follow-up    3 month follow up, discuss lab results    Patient is here today for her 3 months follow up.  She has been feeling fairly well since last appointment.   She does not have additional concerns to discuss today.  Labs are not due today, she had them drawn earlier this week so we will discuss in detail today.  Her GFR continues to be low, but otherwise, her labs are stable.  She needs refills.   I have reviewed her active problem list, medication list, allergies, health maintenance, notes from last encounter, lab results for her appointment today.      No other concerns at this time.   Past Medical History:  Diagnosis Date   Arthritis    Cardiac pacemaker in situ    Chronic kidney disease    function no 100% per patient    Diabetes mellitus without complication (HCC)    Type II   Glaucoma    Hyperlipidemia    Hypertension    Mobitz type 2 second degree AV block     Past Surgical History:  Procedure Laterality Date   ABDOMINAL HYSTERECTOMY     APPENDECTOMY     CATARACT EXTRACTION W/PHACO Right 04/01/2021   Procedure: CATARACT EXTRACTION PHACO AND INTRAOCULAR LENS PLACEMENT (IOC) RIGHT DIABETIC KAHOOK DUAL BLADE GOINIOTOMY 10.28 01.35.6;  Surgeon: Mittie Gaskin, MD;  Location: [facility];  Service: Ophthalmology;  Laterality: Right;   CATARACT EXTRACTION W/PHACO Left 04/22/2021   Procedure: CATARACT EXTRACTION PHACO AND INTRAOCULAR LENS PLACEMENT (IOC) LEFT DIABETIC OMNI CANALOPLASTY AND TRABECULOTOMY 14.83 02:02.9;  Surgeon: Mittie Gaskin, MD;  Location: [facility];  Service: Ophthalmology;  Laterality: Left;  leave last case   CHOLECYSTECTOMY     OOPHORECTOMY     PACEMAKER IMPLANT N/A 07/26/2023   Procedure: PACEMAKER IMPLANT;  Surgeon: Inocencio Soyla Lunger, MD;  Location: [facility];  Service: Cardiovascular;  Laterality: N/A;   PARTIAL KNEE ARTHROPLASTY Left 08/07/2014   PARTIAL KNEE ARTHROPLASTY Left 08/07/2014   Procedure: LEFT MEDIAL PARTIAL KNEE REPLACEMENT ;  Surgeon: Redell Shoals, MD;  Location: [facility] OR;  Service: Orthopedics;  Laterality: Left;   TONSILLECTOMY      Social History   Socioeconomic History   Marital status: Widowed    Spouse name: Not on file   Number of children: Not on file   Years of education: Not on file   Highest education level: Not on file  Occupational History   Not on file  Tobacco Use   Smoking status: Never   Smokeless tobacco: Never  Substance and Sexual Activity   Alcohol use: No    Alcohol/week: 0.0 standard drinks of alcohol   Drug use: No   Sexual activity: Not on file  Other Topics Concern   Not on file  Social History Narrative   Not on file   Social Drivers of Health   Financial Resource Strain: Not on file  Food Insecurity: No Food Insecurity (07/25/2023)   Hunger Vital Sign    Worried About Running Out of Food in the Last Year: Never true    Ran Out of Food in the Last Year: Never true  Transportation Needs: No Transportation Needs (07/25/2023)   PRAPARE - Administrator, Civil Service (Medical): No  Lack of Transportation (Non-Medical): No  Physical Activity: Not on file  Stress: Not on file  Social Connections: Moderately Integrated (07/25/2023)   Social Connection and Isolation Panel    Frequency of Communication with Friends and Family: More than three times a week    Frequency of Social Gatherings with Friends and Family: More than three times a week    Attends Religious Services: More than 4 times per year    Active Member of [employer] or Organizations: Yes    Attends Banker Meetings: More than 4 times per year    Marital Status: Widowed  Intimate Partner Violence: Not At Risk (07/25/2023)   Humiliation, Afraid, Rape, and Kick  questionnaire    Fear of Current or Ex-Partner: No    Emotionally Abused: No    Physically Abused: No    Sexually Abused: No    Family History  Problem Relation Age of Onset   Breast cancer Mother 75    Allergies  Allergen Reactions   Aspirin  Nausea And Vomiting    REGULAR STRENGTH= Also upset stomach    Enalapril Maleate Other (See Comments)    angioedema   Iodinated Contrast Media Other (See Comments)    Vomiting and hives   Iohexol Nausea And Vomiting   Loperamide Hives   Other Other (See Comments)    Tomatoes--acid   Primidone Other (See Comments)    unknown unknown   Sulfa Antibiotics Other (See Comments)    Vomiting and hives   Vasotec [Enalapril]     unknown    Review of Systems  All other systems reviewed and are negative.      Objective:   BP (!) 140/76   Pulse 70   Ht 5' 2 (1.575 m)   Wt 171 lb 9.6 oz (77.8 kg)   SpO2 95%   BMI 31.39 kg/m   Vitals:   11/04/23 1123  BP: (!) 140/76  Pulse: 70  Height: 5' 2 (1.575 m)  Weight: 171 lb 9.6 oz (77.8 kg)  SpO2: 95%  BMI (Calculated): 31.38    Physical Exam Vitals and nursing note reviewed.  Constitutional:      Appearance: Normal appearance. She is normal weight.  HENT:     Head: Normocephalic.   Eyes:     Extraocular Movements: Extraocular movements intact.     Conjunctiva/sclera: Conjunctivae normal.     Pupils: Pupils are equal, round, and reactive to light.    Cardiovascular:     Rate and Rhythm: Normal rate.  Pulmonary:     Effort: Pulmonary effort is normal.   Neurological:     General: No focal deficit present.     Mental Status: She is alert and oriented to person, place, and time. Mental status is at baseline.     Motor: Weakness present.     Gait: Gait abnormal.   Psychiatric:        Mood and Affect: Mood normal.        Behavior: Behavior normal.        Thought Content: Thought content normal.      Results for orders placed or performed in visit on 11/04/23   POCT CBG (Fasting - Glucose)  Result Value Ref Range   Glucose Fasting, POC 147 (A) 70 - 99 mg/dL    Recent Results (from the past 2160 hours)  CUP PACEART REMOTE DEVICE CHECK     Status: None   Collection Time: 09/12/23  2:00 AM  Result Value Ref Range  Date Time Interrogation Session [id number]    Pulse Generator Manufacturer [employer]    Pulse Gen Model 2272 Assurity MRI    Pulse Gen Serial Number [id number]    Clinic Name [facility]    Implantable Pulse Generator Type Implantable Pulse Generator    Implantable Pulse Generator Implant Date 79749688    Implantable Lead Manufacturer [facility]    Implantable Lead Model [id number]    Implantable Lead Serial Number [id number]    Implantable Lead Implant Date 79749688    Implantable Lead Location Detail 1 UNKNOWN    Implantable Lead Location 246140    Implantable Lead Connection Status 246014    Implantable Lead Manufacturer [facility]    Implantable Lead Model [id number]    Implantable Lead Serial Number [id number]    Implantable Lead Implant Date 79749688    Implantable Lead Location Detail 1 UNKNOWN    Implantable Lead Location [id number]    Implantable Lead Connection Status [phone]    Lead Channel Setting Sensing Sensitivity 2.0 mV   Lead Channel Setting Sensing Adaptation Mode Fixed Pacing    Lead Channel Setting Pacing Amplitude 1.625    Lead Channel Setting Pacing Pulse Width 0.5 ms   Lead Channel Setting Pacing Amplitude 1.0 V   Lead Channel Status NULL    Lead Channel Impedance Value 460 ohm   Lead Channel Sensing Intrinsic Amplitude 5.0 mV   Lead Channel Pacing Threshold Amplitude 0.625 V   Lead Channel Pacing Threshold Pulse Width 0.5 ms   Lead Channel Status NULL    Lead Channel Impedance Value 550 ohm   Lead Channel Sensing Intrinsic Amplitude 12.0 mV   Lead Channel Pacing Threshold Amplitude 0.75 V   Lead Channel Pacing Threshold Pulse Width 0.5 ms   Battery Status MOS    Battery Remaining Longevity 124 mo   Battery Remaining  Percentage 95.5 %   Battery Voltage 3.02 V   Brady Statistic RA Percent Paced 73.0 %   Brady Statistic RV Percent Paced 99.0 %   Brady Statistic AP VP Percent 74.0 %   Brady Statistic AS VP Percent 26.0 %   Brady Statistic AP VS Percent 1.0 %   Brady Statistic AS VS Percent 1.0 %  CBC With Diff/Platelet     Status: Abnormal   Collection Time: 11/03/23  9:27 AM  Result Value Ref Range   WBC 3.6 3.4 - 10.8 x10E3/uL   RBC 4.18 3.77 - 5.28 x10E6/uL   Hemoglobin 12.9 11.1 - 15.9 g/dL   Hematocrit 58.9 65.9 - 46.6 %   MCV 98 (H) 79 - 97 fL   MCH 30.9 26.6 - 33.0 pg   MCHC 31.5 31.5 - 35.7 g/dL   RDW 86.6 88.2 - 84.5 %   Platelets 168 150 - 450 x10E3/uL   Neutrophils 33 Not Estab. %   Lymphs 54 Not Estab. %   Monocytes 10 Not Estab. %   Eos 3 Not Estab. %   Basos 0 Not Estab. %   Neutrophils Absolute 1.2 (L) 1.4 - 7.0 x10E3/uL   Lymphocytes Absolute 1.9 0.7 - 3.1 x10E3/uL   Monocytes Absolute 0.4 0.1 - 0.9 x10E3/uL   EOS (ABSOLUTE) 0.1 0.0 - 0.4 x10E3/uL   Basophils Absolute 0.0 0.0 - 0.2 x10E3/uL   Immature Granulocytes 0 Not Estab. %   Immature Grans (Abs) 0.0 0.0 - 0.1 x10E3/uL  Comprehensive metabolic panel with GFR     Status: Abnormal   Collection Time: 11/03/23  9:27 AM  Result Value Ref Range  Glucose 104 (H) 70 - 99 mg/dL   BUN 31 10 - 36 mg/dL   Creatinine, Ser 8.33 (H) 0.57 - 1.00 mg/dL   eGFR 29 (L) >40 fO/fpw/8.26   BUN/Creatinine Ratio 19 12 - 28   Sodium 136 134 - 144 mmol/L   Potassium 4.3 3.5 - 5.2 mmol/L   Chloride 100 96 - 106 mmol/L   CO2 21 20 - 29 mmol/L   Calcium 9.5 8.7 - 10.3 mg/dL   Total Protein 8.0 6.0 - 8.5 g/dL   Albumin  4.0 3.6 - 4.6 g/dL   Globulin, Total 4.0 1.5 - 4.5 g/dL   Bilirubin Total 0.5 0.0 - 1.2 mg/dL   Alkaline Phosphatase 69 44 - 121 IU/L   AST 17 0 - 40 IU/L   ALT 9 0 - 32 IU/L  Lipid panel     Status: None   Collection Time: 11/03/23  9:27 AM  Result Value Ref Range   Cholesterol, Total 130 100 - 199 mg/dL   Triglycerides  58 0 - 149 mg/dL   HDL 52 >60 mg/dL   VLDL Cholesterol Cal 13 5 - 40 mg/dL   LDL Chol Calc (NIH) 65 0 - 99 mg/dL   Chol/HDL Ratio 2.5 0.0 - 4.4 ratio    Comment:                                   T. Chol/HDL Ratio                                             Men  Women                               1/2 Avg.Risk  3.4    3.3                                   Avg.Risk  5.0    4.4                                2X Avg.Risk  9.6    7.1                                3X Avg.Risk 23.4   11.0   Hemoglobin A1c     Status: Abnormal   Collection Time: 11/03/23  9:27 AM  Result Value Ref Range   Hgb A1c MFr Bld 5.8 (H) 4.8 - 5.6 %    Comment:          Prediabetes: 5.7 - 6.4          Diabetes: >6.4          Glycemic control for adults with diabetes: <7.0    Est. average glucose Bld gHb Est-mCnc 120 mg/dL  POCT CBG (Fasting - Glucose)     Status: Abnormal   Collection Time: 11/04/23 11:30 AM  Result Value Ref Range   Glucose Fasting, POC 147 (A) 70 - 99 mg/dL       Assessment & Plan Type 2 diabetes mellitus with hyperglycemia, without  long-term current use of insulin  (HCC) Type 2 diabetes mellitus with obesity (HCC) Continue current diabetes POC, as patient has been well controlled on current regimen.  Will adjust meds if needed based on labs.   Stage 3b chronic kidney disease [facility]) Sending referral to Nephrology.  Will defer to them for further treatment changes. Presence of heart assist device [facility]) Patient is seen by Cardiology, who manage this condition.  She is well controlled with current therapy.   Will defer to them for further changes to plan of care.  Severe obesity (BMI 35.0-39.9) with comorbidity (HCC) Continue current meds.  Will adjust as needed based on results.  The patient is asked to make an attempt to improve diet and exercise patterns to aid in medical management of this problem. Addressed importance of increasing and maintaining water intake.      Return in  about 4 months (around 03/05/2024).   Total time spent: 20 minutes  ALAN CHRISTELLA ARRANT, FNP  11/04/2023   This document may have been prepared by [facility] Voice Recognition software and as such may include unintentional dictation errors.

## 2023-11-07 ENCOUNTER — Ambulatory Visit: Payer: Self-pay

## 2023-11-13 NOTE — Assessment & Plan Note (Signed)
 Patient is seen by Cardiology, who manage this condition.  She is well controlled with current therapy.   Will defer to them for further changes to plan of care.

## 2023-11-13 NOTE — Assessment & Plan Note (Signed)
 Continue current meds.  Will adjust as needed based on results.  The patient is asked to make an attempt to improve diet and exercise patterns to aid in medical management of this problem. Addressed importance of increasing and maintaining water intake.

## 2023-11-13 NOTE — Assessment & Plan Note (Signed)
 Continue current diabetes POC, as patient has been well controlled on current regimen.  Will adjust meds if needed based on labs.

## 2023-11-13 NOTE — Assessment & Plan Note (Signed)
 Sending referral to Nephrology.  Will defer to them for further treatment changes.

## 2023-11-19 ENCOUNTER — Other Ambulatory Visit: Payer: Self-pay | Admitting: Internal Medicine

## 2023-12-01 ENCOUNTER — Encounter: Payer: Self-pay | Admitting: Podiatry

## 2023-12-01 ENCOUNTER — Ambulatory Visit (INDEPENDENT_AMBULATORY_CARE_PROVIDER_SITE_OTHER): Admitting: Podiatry

## 2023-12-01 DIAGNOSIS — B351 Tinea unguium: Secondary | ICD-10-CM

## 2023-12-01 DIAGNOSIS — E0842 Diabetes mellitus due to underlying condition with diabetic polyneuropathy: Secondary | ICD-10-CM

## 2023-12-01 DIAGNOSIS — M79676 Pain in unspecified toe(s): Secondary | ICD-10-CM | POA: Diagnosis not present

## 2023-12-05 ENCOUNTER — Other Ambulatory Visit: Payer: Self-pay | Admitting: Internal Medicine

## 2023-12-07 ENCOUNTER — Encounter: Payer: Self-pay | Admitting: Internal Medicine

## 2023-12-07 NOTE — Progress Notes (Signed)
Subjective:  Patient ID: Haley Mayer, female    DOB: 05/07/1930,  MRN: [id number]  89 y.o. female presents at risk foot care with history of diabetic neuropathy and painful thick toenails that are difficult to trim. Pain interferes with ambulation. Aggravating factors include wearing enclosed shoe gear. Pain is relieved with periodic professional debridement.  Chief Complaint  Patient presents with   Nail Problem    Thick painful toenails, 3 month follow up    Diabetes    New problem(s): None   PCP is Orlean Alan HERO, FNP , and last visit was November 04, 2023.  Allergies  Allergen Reactions   Aspirin  Nausea And Vomiting    REGULAR STRENGTH= Also upset stomach    Enalapril Maleate Other (See Comments)    angioedema   Iodinated Contrast Media Other (See Comments)    Vomiting and hives   Iohexol Nausea And Vomiting   Loperamide Hives   Other Other (See Comments)    Tomatoes--acid   Primidone Other (See Comments)    unknown unknown   Sulfa Antibiotics Other (See Comments)    Vomiting and hives   Vasotec [Enalapril]     unknown    Review of Systems: Negative except as noted in the HPI.   Objective:  Haley Mayer is a pleasant 89 y.o. female WD, WN in NAD. AAO x 3.  Vascular Examination: CFT less than 3 seconds. DP pulses palpable b/l. PT pulses nonpalpable b/l. Digital hair absent. Skin temperature gradient warm to warn b/l. No ischemia or gangrene. No cyanosis or clubbing noted b/l. No edema noted b/l LE.   Neurological Examination: Protective sensation diminished with 10g monofilament b/l.  Dermatological Examination: Pedal skin thin, shiny and atrophic b/l. No open wounds. No interdigital macerations.   Toenails 1-5 b/l thick, discolored, elongated with subungual debris and pain on dorsal palpation.   No hyperkeratotic nor porokeratotic lesions present on today's visit.  Musculoskeletal Examination: Muscle strength 5/5 to all lower extremity muscle groups  bilaterally. No pain, crepitus or joint limitation noted with ROM bilateral LE.  Radiographs: None  Last A1c:      Latest Ref Rng & Units 11/03/2023    9:27 AM 04/20/2023    9:39 AM 01/20/2023    9:40 AM  Hemoglobin A1C  Hemoglobin-A1c 4.8 - 5.6 % 5.8  6.0  5.9    Assessment:   1. Pain due to onychomycosis of toenail   2. Diabetes mellitus due to underlying condition with diabetic polyneuropathy, unspecified whether long term insulin  use (HCC)    Plan:  Patient was evaluated and treated. All patient's and/or POA's questions/concerns addressed on today's visit. Toenails 1-5 debrided in length and girth without incident. Continue soft, supportive shoe gear daily. Report any pedal injuries to medical professional. Call office if there are any questions/concerns. -Patient/POA to call should there be question/concern in the interim.  Return in about 3 months (around 03/02/2024).  Delon LITTIE Merlin, DPM      [place] LOCATION: [address] [place] [address]                   Office [phone]   [facility] LOCATION: [address] [place], [place] [address] Office [phone]  [phone] 

## 2023-12-12 ENCOUNTER — Ambulatory Visit (INDEPENDENT_AMBULATORY_CARE_PROVIDER_SITE_OTHER)

## 2023-12-12 DIAGNOSIS — I441 Atrioventricular block, second degree: Secondary | ICD-10-CM | POA: Diagnosis not present

## 2023-12-13 LAB — CUP PACEART REMOTE DEVICE CHECK
Battery Remaining Longevity: 121 mo
Battery Remaining Percentage: 95.5 %
Battery Voltage: 3.01 V
Brady Statistic AP VP Percent: 72 %
Brady Statistic AP VS Percent: 1 %
Brady Statistic AS VP Percent: 28 %
Brady Statistic AS VS Percent: 1 %
Brady Statistic RA Percent Paced: 70 %
Brady Statistic RV Percent Paced: 99 %
Date Time Interrogation Session: 20250728020659
Implantable Lead Connection Status: 753985
Implantable Lead Connection Status: 753985
Implantable Lead Implant Date: 20250311
Implantable Lead Implant Date: 20250311
Implantable Lead Location: 753859
Implantable Lead Location: 753860
Implantable Pulse Generator Implant Date: 20250311
Lead Channel Impedance Value: 540 Ohm
Lead Channel Impedance Value: 560 Ohm
Lead Channel Pacing Threshold Amplitude: 0.625 V
Lead Channel Pacing Threshold Amplitude: 0.75 V
Lead Channel Pacing Threshold Pulse Width: 0.5 ms
Lead Channel Pacing Threshold Pulse Width: 0.5 ms
Lead Channel Sensing Intrinsic Amplitude: 12 mV
Lead Channel Sensing Intrinsic Amplitude: 5 mV
Lead Channel Setting Pacing Amplitude: 1 V
Lead Channel Setting Pacing Amplitude: 1.625
Lead Channel Setting Pacing Pulse Width: 0.5 ms
Lead Channel Setting Sensing Sensitivity: 2 mV
Pulse Gen Model: 2272
Pulse Gen Serial Number: 8254475

## 2023-12-14 ENCOUNTER — Ambulatory Visit: Payer: Self-pay | Admitting: Cardiology

## 2023-12-15 ENCOUNTER — Other Ambulatory Visit: Payer: Self-pay | Admitting: Nephrology

## 2023-12-15 DIAGNOSIS — N1832 Chronic kidney disease, stage 3b: Secondary | ICD-10-CM

## 2023-12-15 DIAGNOSIS — R8290 Unspecified abnormal findings in urine: Secondary | ICD-10-CM

## 2023-12-15 IMAGING — MG MM DIGITAL SCREENING BILAT W/ TOMO AND CAD
8 series · 8 of 24 positions shown · non-contrast
Comparison: Previous exam(s).

CLINICAL DATA: Screening.

EXAM:
DIGITAL SCREENING BILATERAL MAMMOGRAM WITH TOMOSYNTHESIS AND CAD
TECHNIQUE: Bilateral screening digital craniocaudal and mediolateral oblique
mammograms were obtained. Bilateral screening digital breast
tomosynthesis was performed. The images were evaluated with
computer-aided detection.

[R CC synth-2D]
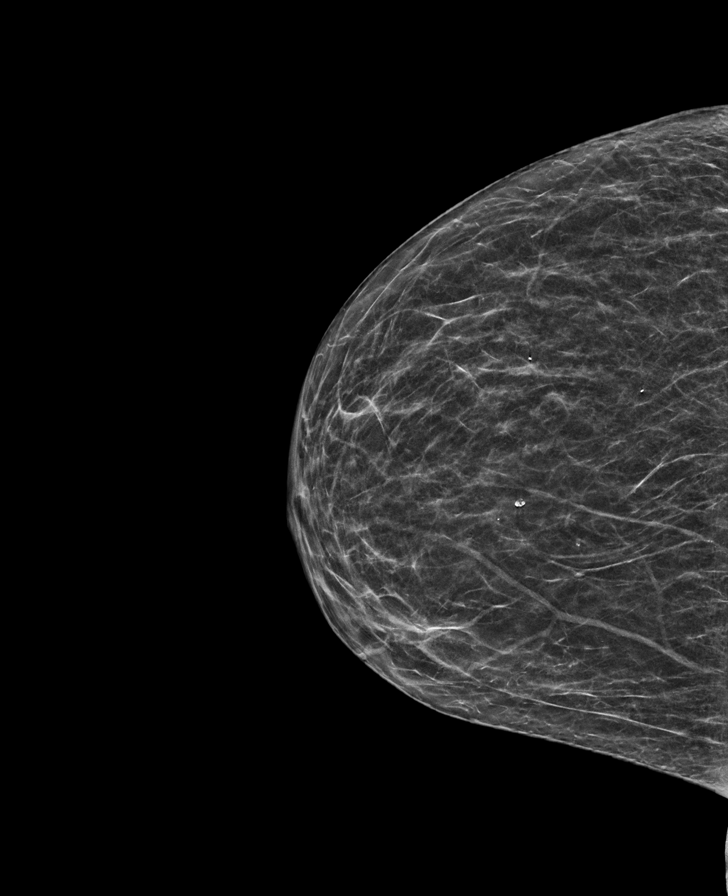

[L MLO synth-2D]
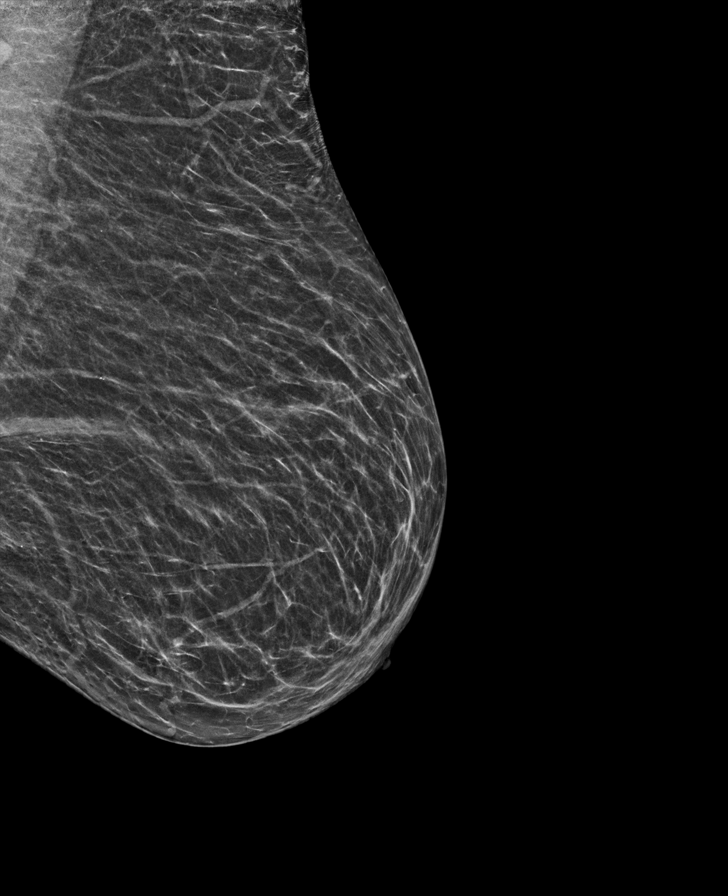

[R MLO synth-2D]
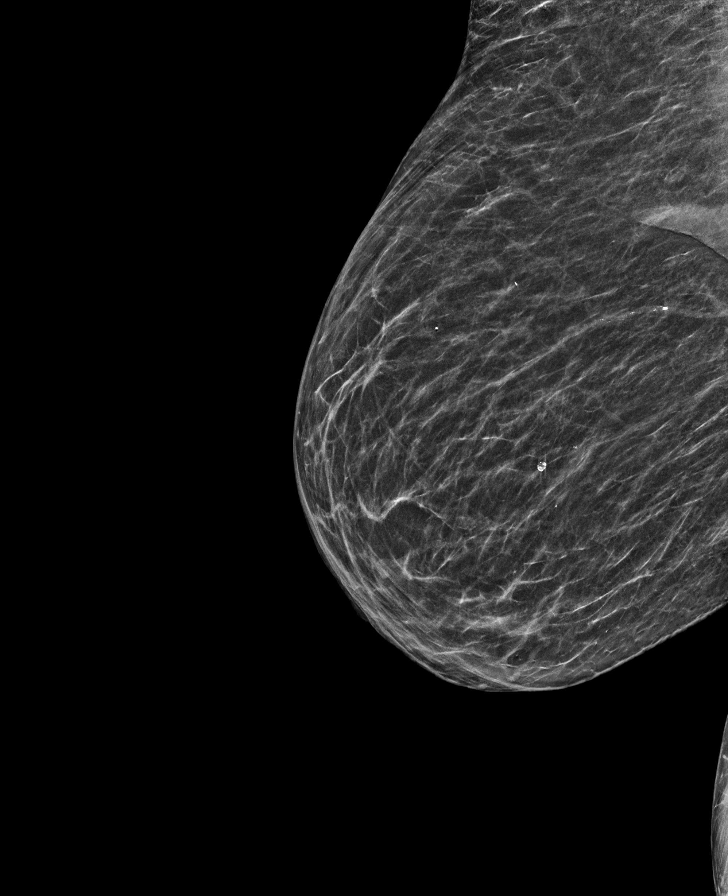

[L CC synth-2D]
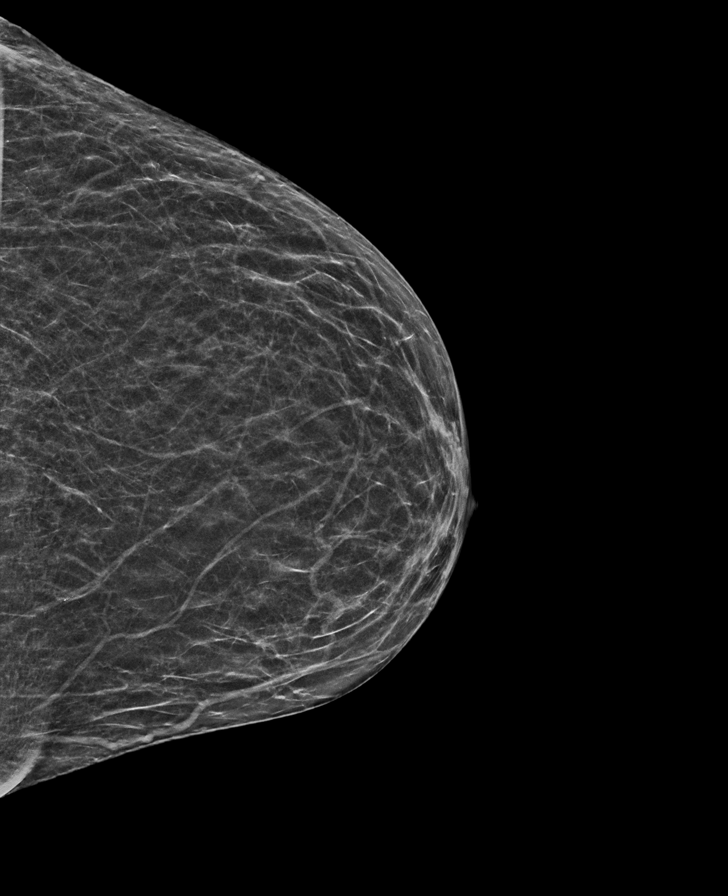

[R CC tomo · tomo slice 25/50.0]
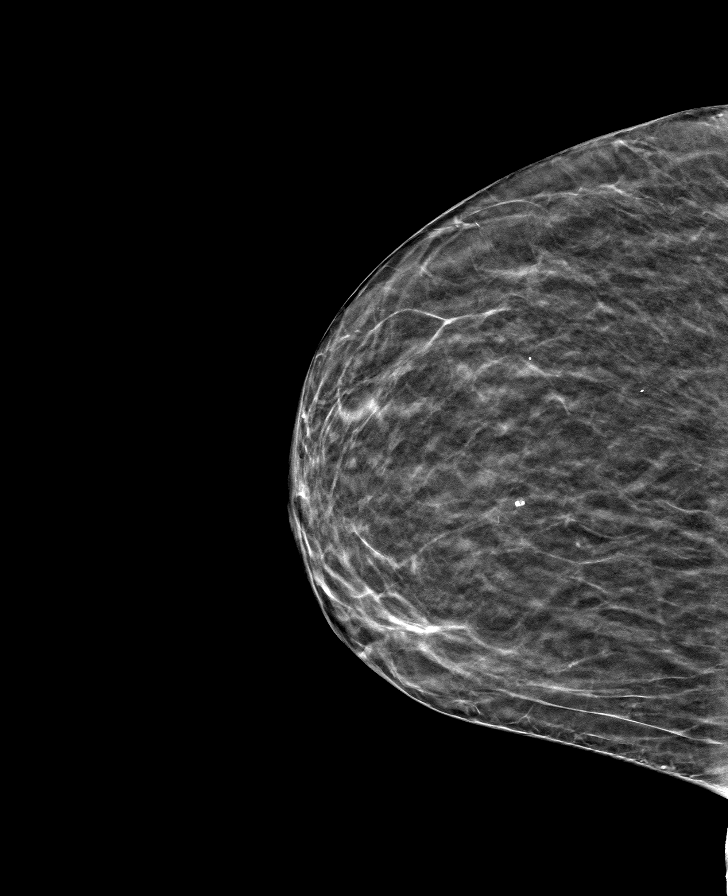

[L MLO tomo · tomo slice 28/55.0]
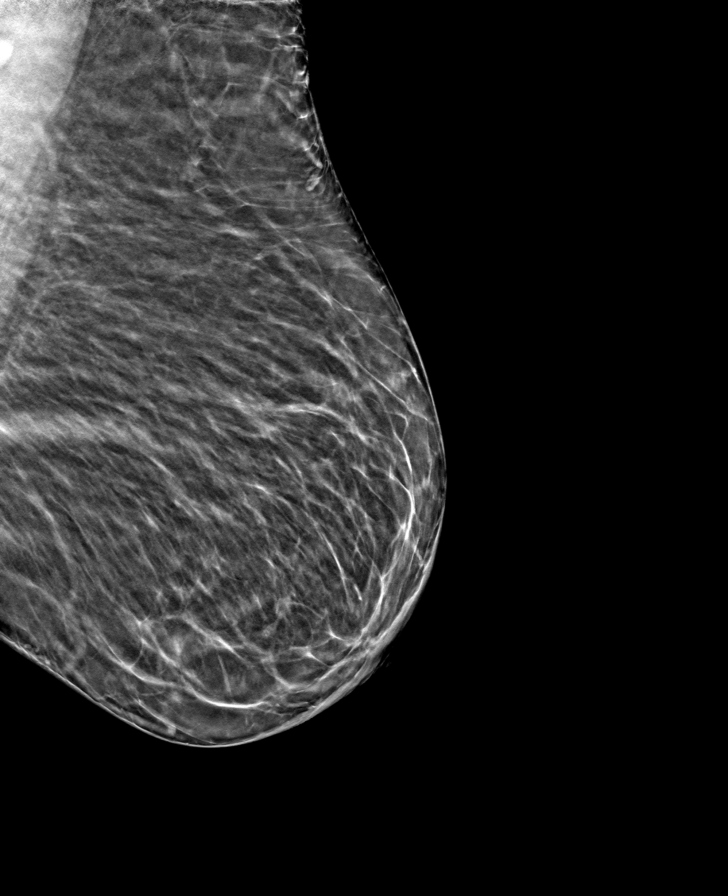

[R MLO tomo · tomo slice 29/57.0]
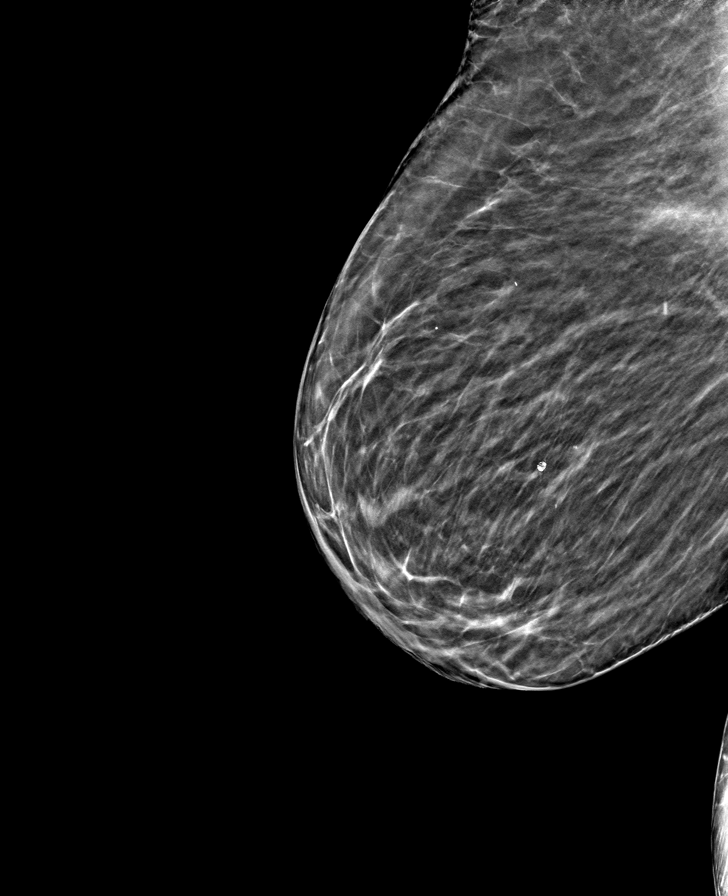

[L CC tomo · tomo slice 25/49.0]
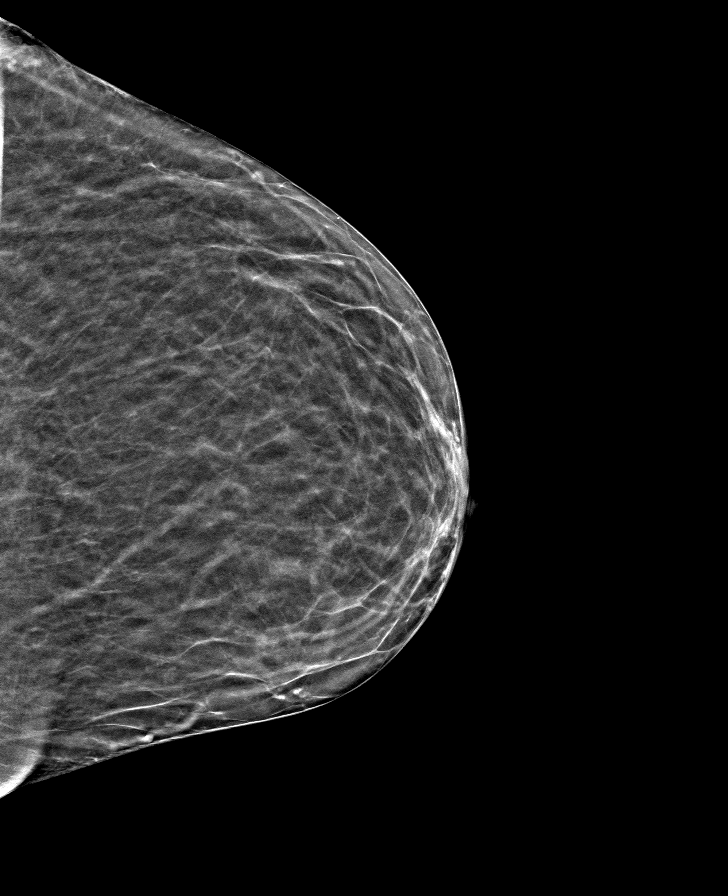

[8 of 24 positions shown; findings below may reference images not displayed]

ACR Breast Density Category b: There are scattered areas of
fibroglandular density.
FINDINGS: There are no findings suspicious for malignancy.
IMPRESSION: No mammographic evidence of malignancy. A result letter of this
screening mammogram will be mailed directly to the patient.

RECOMMENDATION:
Screening mammogram in one year. (Code:51-O-LD2)

BI-RADS CATEGORY  1: Negative.

## 2023-12-20 ENCOUNTER — Ambulatory Visit
Admission: RE | Admit: 2023-12-20 | Discharge: 2023-12-20 | Disposition: A | Discharge status: Home / Self Care | Source: Ambulatory Visit | Attending: Nephrology | Admitting: Nephrology

## 2023-12-20 DIAGNOSIS — R8290 Unspecified abnormal findings in urine: Secondary | ICD-10-CM | POA: Insufficient documentation

## 2023-12-20 DIAGNOSIS — N1832 Chronic kidney disease, stage 3b: Secondary | ICD-10-CM | POA: Insufficient documentation

## 2024-02-16 NOTE — Progress Notes (Signed)
 Remote PPM Transmission

## 2024-03-01 ENCOUNTER — Other Ambulatory Visit: Payer: Self-pay | Admitting: Family

## 2024-03-05 ENCOUNTER — Ambulatory Visit: Admitting: Family

## 2024-03-05 ENCOUNTER — Encounter: Payer: Self-pay | Admitting: Family

## 2024-03-05 VITALS — BP 122/66 | HR 78 | Ht 62.0 in | Wt 178.4 lb

## 2024-03-05 DIAGNOSIS — E1165 Type 2 diabetes mellitus with hyperglycemia: Secondary | ICD-10-CM | POA: Diagnosis not present

## 2024-03-05 DIAGNOSIS — E782 Mixed hyperlipidemia: Secondary | ICD-10-CM | POA: Diagnosis not present

## 2024-03-05 DIAGNOSIS — I10 Essential (primary) hypertension: Secondary | ICD-10-CM | POA: Diagnosis not present

## 2024-03-05 DIAGNOSIS — Z23 Encounter for immunization: Secondary | ICD-10-CM

## 2024-03-05 DIAGNOSIS — N184 Chronic kidney disease, stage 4 (severe): Secondary | ICD-10-CM

## 2024-03-05 DIAGNOSIS — I129 Hypertensive chronic kidney disease with stage 1 through stage 4 chronic kidney disease, or unspecified chronic kidney disease: Secondary | ICD-10-CM | POA: Insufficient documentation

## 2024-03-05 LAB — POCT CBG (FASTING - GLUCOSE)-MANUAL ENTRY: Glucose Fasting, POC: 149 mg/dL — AB (ref 70–99)

## 2024-03-05 NOTE — Assessment & Plan Note (Signed)
Patient is seen by nephrology, who manage this condition.  She is well controlled with current therapy.   Will defer to them for further changes to plan of care.

## 2024-03-05 NOTE — Progress Notes (Signed)
 Established Patient Office Visit  Subjective:  Patient ID: Haley Mayer, female    DOB: 1930-03-25  Age: 89 y.o. MRN: [id number]  Chief Complaint  Patient presents with   Follow-up    4 month follow up    Patient is here today for her 4 months follow up.  She has been feeling fairly well since last appointment.   She does not have additional concerns to discuss today.  Azora presented for routine follow-up and influenza vaccination. She reports feeling well with no specific concerns today.  Recent nephrology consultation has been completed as previously discussed, with specialist now involved in her care. She has been providing blood samples to the nephrology service on multiple occasions (2-3 times) for monitoring purposes.  Immunizations were reviewed and influenza vaccine was indicated. The high-dose formulation appropriate for patients over 65 was administered by nursing staff.  Laboratory studies were deferred as recent blood work has been obtained through nephrology services. These results will be reviewed and incorporated into ongoing care management.  Labs are not due today.  She needs refills.   I have reviewed her active problem list, medication list, allergies, health maintenance, notes from last encounter, lab results for her appointment today.      No other concerns at this time.   Past Medical History:  Diagnosis Date   Arthritis    Cardiac pacemaker in situ    Chronic kidney disease    function no 100% per patient    Diabetes mellitus without complication (HCC)    Type II   Glaucoma    Hyperlipidemia    Hypertension    Mobitz type 2 second degree AV block     Past Surgical History:  Procedure Laterality Date   ABDOMINAL HYSTERECTOMY     APPENDECTOMY     CATARACT EXTRACTION W/PHACO Right 04/01/2021   Procedure: CATARACT EXTRACTION PHACO AND INTRAOCULAR LENS PLACEMENT (IOC) RIGHT DIABETIC KAHOOK DUAL BLADE GOINIOTOMY 10.28 01.35.6;  Surgeon:  Mittie Gaskin, MD;  Location: [facility];  Service: Ophthalmology;  Laterality: Right;   CATARACT EXTRACTION W/PHACO Left 04/22/2021   Procedure: CATARACT EXTRACTION PHACO AND INTRAOCULAR LENS PLACEMENT (IOC) LEFT DIABETIC OMNI CANALOPLASTY AND TRABECULOTOMY 14.83 02:02.9;  Surgeon: Mittie Gaskin, MD;  Location: [facility];  Service: Ophthalmology;  Laterality: Left;  leave last case   CHOLECYSTECTOMY     OOPHORECTOMY     PACEMAKER IMPLANT N/A 07/26/2023   Procedure: PACEMAKER IMPLANT;  Surgeon: Inocencio Soyla Lunger, MD;  Location: [facility];  Service: Cardiovascular;  Laterality: N/A;   PARTIAL KNEE ARTHROPLASTY Left 08/07/2014   PARTIAL KNEE ARTHROPLASTY Left 08/07/2014   Procedure: LEFT MEDIAL PARTIAL KNEE REPLACEMENT ;  Surgeon: Redell Shoals, MD;  Location: [facility] OR;  Service: Orthopedics;  Laterality: Left;   TONSILLECTOMY      Social History   Socioeconomic History   Marital status: Widowed    Spouse name: Not on file   Number of children: Not on file   Years of education: Not on file   Highest education level: Not on file  Occupational History   Not on file  Tobacco Use   Smoking status: Never   Smokeless tobacco: Never  Substance and Sexual Activity   Alcohol use: No    Alcohol/week: 0.0 standard drinks of alcohol   Drug use: No   Sexual activity: Not on file  Other Topics Concern   Not on file  Social History Narrative   Not on file   Social  Drivers of Corporate investment banker Strain: Not on file  Food Insecurity: No Food Insecurity (07/25/2023)   Hunger Vital Sign    Worried About Running Out of Food in the Last Year: Never true    Ran Out of Food in the Last Year: Never true  Transportation Needs: No Transportation Needs (07/25/2023)   PRAPARE - Administrator, Civil Service (Medical): No    Lack of Transportation (Non-Medical): No  Physical Activity: Not on file  Stress: Not on file  Social Connections:  Moderately Integrated (07/25/2023)   Social Connection and Isolation Panel    Frequency of Communication with Friends and Family: More than three times a week    Frequency of Social Gatherings with Friends and Family: More than three times a week    Attends Religious Services: More than 4 times per year    Active Member of [employer] or Organizations: Yes    Attends Banker Meetings: More than 4 times per year    Marital Status: Widowed  Intimate Partner Violence: Not At Risk (07/25/2023)   Humiliation, Afraid, Rape, and Kick questionnaire    Fear of Current or Ex-Partner: No    Emotionally Abused: No    Physically Abused: No    Sexually Abused: No    Family History  Problem Relation Age of Onset   Breast cancer Mother 36    Allergies  Allergen Reactions   Enalapril Other (See Comments) and Swelling    unknown  unknown    angioedema   Iodinated Contrast Media Other (See Comments), Hives, Nausea And Vomiting and Nausea Only    Vomiting and hives   Aspirin  Nausea And Vomiting    REGULAR STRENGTH= Also upset stomach    Enalapril Maleate Other (See Comments)    angioedema   Iohexol Nausea And Vomiting   Loperamide Hives   Other Other (See Comments)    Tomatoes--acid   Primidone Other (See Comments)    unknown  unknown unknown    unknown   Sulfa Antibiotics Other (See Comments)    Vomiting and hives    Review of Systems  All other systems reviewed and are negative.      Objective:   BP 122/66   Pulse 78   Ht 5' 2 (1.575 m)   Wt 178 lb 6.4 oz (80.9 kg)   SpO2 98%   BMI 32.63 kg/m   Vitals:   03/05/24 1054  BP: 122/66  Pulse: 78  Height: 5' 2 (1.575 m)  Weight: 178 lb 6.4 oz (80.9 kg)  SpO2: 98%  BMI (Calculated): 32.62    Physical Exam Vitals and nursing note reviewed.  Constitutional:      Appearance: Normal appearance. She is normal weight.  HENT:     Head: Normocephalic and atraumatic.     Right Ear: Tympanic membrane normal.      Left Ear: Tympanic membrane normal.  Eyes:     Extraocular Movements: Extraocular movements intact.     Conjunctiva/sclera: Conjunctivae normal.     Pupils: Pupils are equal, round, and reactive to light.  Cardiovascular:     Rate and Rhythm: Normal rate and regular rhythm.  Pulmonary:     Effort: Pulmonary effort is normal.  Musculoskeletal:     Cervical back: Normal range of motion.  Neurological:     General: No focal deficit present.     Mental Status: She is alert and oriented to person, place, and time. Mental status is at  baseline.  Psychiatric:        Mood and Affect: Mood normal.        Behavior: Behavior normal.        Thought Content: Thought content normal.        Judgment: Judgment normal.      Results for orders placed or performed in visit on 03/05/24  POCT CBG (Fasting - Glucose)  Result Value Ref Range   Glucose Fasting, POC 149 (A) 70 - 99 mg/dL    Recent Results (from the past 2160 hours)  CUP PACEART REMOTE DEVICE CHECK     Status: None   Collection Time: 12/12/23  2:06 AM  Result Value Ref Range   Date Time Interrogation Session [id number]    Pulse Generator Manufacturer [employer]    Pulse Gen Model 2272 Assurity MRI    Pulse Gen Serial Number [id number]    Clinic Name [facility]    Implantable Pulse Generator Type Implantable Pulse Generator    Implantable Pulse Generator Implant Date 79749688    Implantable Lead Manufacturer [facility]    Implantable Lead Model [id number]    Implantable Lead Serial Number [id number]    Implantable Lead Implant Date 79749688    Implantable Lead Location Detail 1 UNKNOWN    Implantable Lead Location [id number]    Implantable Lead Connection Status [id number]    Implantable Lead Manufacturer [facility]    Implantable Lead Model [id number]    Implantable Lead Serial Number [id number]    Implantable Lead Implant Date 79749688    Implantable Lead Location Detail 1 UNKNOWN    Implantable Lead Location [id number]    Implantable Lead Connection  Status [id number]    Lead Channel Setting Sensing Sensitivity 2.0 mV   Lead Channel Setting Sensing Adaptation Mode Fixed Pacing    Lead Channel Setting Pacing Amplitude 1.625    Lead Channel Setting Pacing Pulse Width 0.5 ms   Lead Channel Setting Pacing Amplitude 1.0 V   Lead Channel Status NULL    Lead Channel Impedance Value 540 ohm   Lead Channel Sensing Intrinsic Amplitude 5.0 mV   Lead Channel Pacing Threshold Amplitude 0.625 V   Lead Channel Pacing Threshold Pulse Width 0.5 ms   Lead Channel Status NULL    Lead Channel Impedance Value 560 ohm   Lead Channel Sensing Intrinsic Amplitude 12.0 mV   Lead Channel Pacing Threshold Amplitude 0.75 V   Lead Channel Pacing Threshold Pulse Width 0.5 ms   Battery Status MOS    Battery Remaining Longevity 121 mo   Battery Remaining Percentage 95.5 %   Battery Voltage 3.01 V   Brady Statistic RA Percent Paced 70.0 %   Brady Statistic RV Percent Paced 99.0 %   Brady Statistic AP VP Percent 72.0 %   Brady Statistic AS VP Percent 28.0 %   Brady Statistic AP VS Percent 1.0 %   Brady Statistic AS VS Percent 1.0 %  POCT CBG (Fasting - Glucose)     Status: Abnormal   Collection Time: 03/05/24 11:00 AM  Result Value Ref Range   Glucose Fasting, POC 149 (A) 70 - 99 mg/dL       Assessment & Plan Type 2 diabetes mellitus with hyperglycemia, without long-term current use of insulin  (HCC) Continue current diabetes POC, as patient has been well controlled on current regimen.  Will adjust meds if needed based on labs.   Flu vaccine need Flu vaccine given in office today.  Pt encouraged to manage side effects with supportive measures.  Chronic kidney disease, stage IV (severe) (HCC) Patient is seen by nephrology, who manage this condition.  She is well controlled with current therapy.   Will defer to them for further changes to plan of care.  Essential hypertension Blood pressure well controlled with current medications.  Continue current  therapy.  Will reassess at follow up.   Mixed hyperlipidemia Continue current therapy for lipid control. Will modify as needed based on labwork results.      Return in about 4 months (around 07/06/2024).   Total time spent: 20 minutes  ALAN CHRISTELLA ARRANT, FNP  03/05/2024   This document may have been prepared by [facility] Voice Recognition software and as such may include unintentional dictation errors.

## 2024-03-05 NOTE — Assessment & Plan Note (Signed)
 Blood pressure well controlled with current medications.  Continue current therapy.  Will reassess at follow up.

## 2024-03-05 NOTE — Assessment & Plan Note (Signed)
 Continue current therapy for lipid control. Will modify as needed based on labwork results.

## 2024-03-08 ENCOUNTER — Encounter: Payer: Self-pay | Admitting: Podiatry

## 2024-03-08 ENCOUNTER — Ambulatory Visit (INDEPENDENT_AMBULATORY_CARE_PROVIDER_SITE_OTHER): Admitting: Podiatry

## 2024-03-08 DIAGNOSIS — E0842 Diabetes mellitus due to underlying condition with diabetic polyneuropathy: Secondary | ICD-10-CM

## 2024-03-08 DIAGNOSIS — B351 Tinea unguium: Secondary | ICD-10-CM | POA: Diagnosis not present

## 2024-03-08 DIAGNOSIS — M79676 Pain in unspecified toe(s): Secondary | ICD-10-CM | POA: Diagnosis not present

## 2024-03-12 ENCOUNTER — Ambulatory Visit: Payer: Self-pay | Admitting: Cardiology

## 2024-03-12 ENCOUNTER — Ambulatory Visit

## 2024-03-12 DIAGNOSIS — I441 Atrioventricular block, second degree: Secondary | ICD-10-CM

## 2024-03-12 LAB — CUP PACEART REMOTE DEVICE CHECK
Battery Remaining Longevity: 118 mo
Battery Remaining Percentage: 95.5 %
Battery Voltage: 2.99 V
Brady Statistic AP VP Percent: 71 %
Brady Statistic AP VS Percent: 1 %
Brady Statistic AS VP Percent: 28 %
Brady Statistic AS VS Percent: 1 %
Brady Statistic RA Percent Paced: 69 %
Brady Statistic RV Percent Paced: 99 %
Date Time Interrogation Session: 20251027020014
Implantable Lead Connection Status: 753985
Implantable Lead Connection Status: 753985
Implantable Lead Implant Date: 20250311
Implantable Lead Implant Date: 20250311
Implantable Lead Location: 753859
Implantable Lead Location: 753860
Implantable Pulse Generator Implant Date: 20250311
Lead Channel Impedance Value: 490 Ohm
Lead Channel Impedance Value: 540 Ohm
Lead Channel Pacing Threshold Amplitude: 0.625 V
Lead Channel Pacing Threshold Amplitude: 0.625 V
Lead Channel Pacing Threshold Pulse Width: 0.5 ms
Lead Channel Pacing Threshold Pulse Width: 0.5 ms
Lead Channel Sensing Intrinsic Amplitude: 12 mV
Lead Channel Sensing Intrinsic Amplitude: 5 mV
Lead Channel Setting Pacing Amplitude: 0.875
Lead Channel Setting Pacing Amplitude: 1.625
Lead Channel Setting Pacing Pulse Width: 0.5 ms
Lead Channel Setting Sensing Sensitivity: 2 mV
Pulse Gen Model: 2272
Pulse Gen Serial Number: 8254475

## 2024-03-15 NOTE — Progress Notes (Signed)
 Subjective:  Patient ID: Haley Mayer, female    DOB: April 06, 1930,  MRN: [id number]  Haley Mayer presents to clinic today for at risk foot care with history of diabetic neuropathy and painful mycotic toenails x 10 which interfere with daily activities. Pain is relieved with periodic professional debridement.  Chief Complaint  Patient presents with   Toe Pain    FNP Orlean is her PCP. Last visit was in June. A1c is 5.8   New problem(s): None.   PCP is Orlean Alan HERO, FNP.  Allergies  Allergen Reactions   Enalapril Other (See Comments) and Swelling    unknown  unknown    angioedema   Iodinated Contrast Media Other (See Comments), Hives, Nausea And Vomiting and Nausea Only    Vomiting and hives   Aspirin  Nausea And Vomiting    REGULAR STRENGTH= Also upset stomach    Enalapril Maleate Other (See Comments)    angioedema   Iohexol Nausea And Vomiting   Loperamide Hives   Other Other (See Comments)    Tomatoes--acid   Primidone Other (See Comments)    unknown  unknown unknown    unknown   Sulfa Antibiotics Other (See Comments)    Vomiting and hives    Review of Systems: Negative except as noted in the HPI.  Objective: No changes noted in today's physical examination. There were no vitals filed for this visit. ALVIS PULCINI is a pleasant 89 y.o. female WD, WN in NAD. AAO x 3.  Vascular Examination: CFT less than 3 seconds. DP pulses palpable b/l. PT pulses nonpalpable b/l. Digital hair absent. Skin temperature gradient warm to warn b/l. No ischemia or gangrene. No cyanosis or clubbing noted b/l. No edema noted b/l LE.   Neurological Examination: Protective sensation diminished with 10g monofilament b/l.  Dermatological Examination: Pedal skin thin, shiny and atrophic b/l. No open wounds. No interdigital macerations.   Toenails 1-5 b/l thick, discolored, elongated with subungual debris and pain on dorsal palpation.   No corns, calluses, nor porokeratotic  lesions.  Musculoskeletal Examination: Muscle strength 5/5 to all lower extremity muscle groups bilaterally. No pain, crepitus or joint limitation noted with ROM bilateral LE. No gross bony deformities bilaterally.  Radiographs: None  Last A1c:      Latest Ref Rng & Units 11/03/2023    9:27 AM 04/20/2023    9:39 AM  Hemoglobin A1C  Hemoglobin-A1c 4.8 - 5.6 % 5.8  6.0    Assessment/Plan: 1. Pain due to onychomycosis of toenail   2. Diabetes mellitus due to underlying condition with diabetic polyneuropathy, unspecified whether long term insulin  use [facility])   Consent given for treatment. Patient examined. All patient's and/or POA's questions/concerns addressed on today's visit. Mycotic toenails 1-5 b/l debrided in length and girth without incident. Continue foot and shoe inspections daily. Monitor blood glucose per PCP/Endocrinologist's recommendations.Continue soft, supportive shoe gear daily. Report any pedal injuries to medical professional. Call office if there are any quesitons/concerns. -Patient/POA to call should there be question/concern in the interim.   Return in about 3 months (around 06/08/2024).  Delon LITTIE Merlin, DPM      [place] LOCATION: [address], [place] [address]  Office [phone]   [facility] LOCATION: [address] [place], [place] [address] Office [phone]

## 2024-03-15 NOTE — Progress Notes (Signed)
 Remote PPM Transmission

## 2024-06-11 ENCOUNTER — Ambulatory Visit

## 2024-06-11 DIAGNOSIS — I441 Atrioventricular block, second degree: Secondary | ICD-10-CM | POA: Diagnosis not present

## 2024-06-12 ENCOUNTER — Ambulatory Visit: Payer: Self-pay | Admitting: Cardiology

## 2024-06-12 LAB — CUP PACEART REMOTE DEVICE CHECK
Battery Remaining Longevity: 112 mo
Battery Remaining Percentage: 94 %
Battery Voltage: 2.99 V
Brady Statistic AP VP Percent: 69 %
Brady Statistic AP VS Percent: 1 %
Brady Statistic AS VP Percent: 30 %
Brady Statistic AS VS Percent: 1 %
Brady Statistic RA Percent Paced: 67 %
Brady Statistic RV Percent Paced: 99 %
Date Time Interrogation Session: 20260126020013
Implantable Lead Connection Status: 753985
Implantable Lead Connection Status: 753985
Implantable Lead Implant Date: 20250311
Implantable Lead Implant Date: 20250311
Implantable Lead Location: 753859
Implantable Lead Location: 753860
Implantable Pulse Generator Implant Date: 20250311
Lead Channel Impedance Value: 460 Ohm
Lead Channel Impedance Value: 530 Ohm
Lead Channel Pacing Threshold Amplitude: 0.625 V
Lead Channel Pacing Threshold Amplitude: 0.75 V
Lead Channel Pacing Threshold Pulse Width: 0.5 ms
Lead Channel Pacing Threshold Pulse Width: 0.5 ms
Lead Channel Sensing Intrinsic Amplitude: 12 mV
Lead Channel Sensing Intrinsic Amplitude: 4.7 mV
Lead Channel Setting Pacing Amplitude: 1 V
Lead Channel Setting Pacing Amplitude: 1.625
Lead Channel Setting Pacing Pulse Width: 0.5 ms
Lead Channel Setting Sensing Sensitivity: 2 mV
Pulse Gen Model: 2272
Pulse Gen Serial Number: 8254475

## 2024-06-18 NOTE — Progress Notes (Signed)
 Remote PPM Transmission

## 2024-06-25 ENCOUNTER — Ambulatory Visit: Admitting: Podiatry

## 2024-07-06 ENCOUNTER — Ambulatory Visit: Admitting: Family

## 2024-09-10 ENCOUNTER — Encounter
